# Patient Record
Sex: Male | Born: 1970 | Race: White | Hispanic: No | Marital: Single | Smoking: Never smoker
Health system: Southern US, Community
[De-identification: ages and names within clinical notes are randomized; demographics above are authoritative.]

## PROBLEM LIST (undated history)

## (undated) DIAGNOSIS — T7840XA Allergy, unspecified, initial encounter: Secondary | ICD-10-CM

## (undated) DIAGNOSIS — R12 Heartburn: Secondary | ICD-10-CM

## (undated) DIAGNOSIS — K760 Fatty (change of) liver, not elsewhere classified: Secondary | ICD-10-CM

## (undated) DIAGNOSIS — E785 Hyperlipidemia, unspecified: Secondary | ICD-10-CM

## (undated) DIAGNOSIS — D689 Coagulation defect, unspecified: Secondary | ICD-10-CM

## (undated) DIAGNOSIS — Z87442 Personal history of urinary calculi: Secondary | ICD-10-CM

## (undated) DIAGNOSIS — D649 Anemia, unspecified: Secondary | ICD-10-CM

## (undated) DIAGNOSIS — K219 Gastro-esophageal reflux disease without esophagitis: Secondary | ICD-10-CM

## (undated) DIAGNOSIS — N200 Calculus of kidney: Secondary | ICD-10-CM

## (undated) DIAGNOSIS — I81 Portal vein thrombosis: Principal | ICD-10-CM

## (undated) HISTORY — DX: Heartburn: R12

## (undated) HISTORY — DX: Coagulation defect, unspecified: D68.9

## (undated) HISTORY — DX: Allergy, unspecified, initial encounter: T78.40XA

## (undated) HISTORY — DX: Gastro-esophageal reflux disease without esophagitis: K21.9

## (undated) HISTORY — DX: Hyperlipidemia, unspecified: E78.5

## (undated) HISTORY — DX: Calculus of kidney: N20.0

## (undated) HISTORY — DX: Fatty (change of) liver, not elsewhere classified: K76.0

## (undated) HISTORY — PX: LASIK: SHX215

## (undated) HISTORY — PX: OTHER SURGICAL HISTORY: SHX169

---

## 2003-01-23 ENCOUNTER — Emergency Department (HOSPITAL_COMMUNITY): Admission: EM | Admit: 2003-01-23 | Discharge: 2003-01-24 | Payer: Self-pay | Admitting: Emergency Medicine

## 2003-01-24 ENCOUNTER — Encounter: Payer: Self-pay | Admitting: Emergency Medicine

## 2005-12-09 HISTORY — PX: ANTERIOR CRUCIATE LIGAMENT REPAIR: SHX115

## 2007-12-28 ENCOUNTER — Emergency Department (HOSPITAL_COMMUNITY): Admission: EM | Admit: 2007-12-28 | Discharge: 2007-12-28 | Payer: Self-pay | Admitting: Emergency Medicine

## 2009-03-30 ENCOUNTER — Emergency Department (HOSPITAL_BASED_OUTPATIENT_CLINIC_OR_DEPARTMENT_OTHER): Admission: EM | Admit: 2009-03-30 | Discharge: 2009-03-30 | Payer: Self-pay | Admitting: Emergency Medicine

## 2011-04-07 LAB — CBC
HCT: 47.7
Hemoglobin: 16.7
MCHC: 35
RBC: 4.92
RDW: 12.8

## 2011-04-07 LAB — URINALYSIS, ROUTINE W REFLEX MICROSCOPIC
Bilirubin Urine: NEGATIVE
Ketones, ur: 15 — AB
Leukocytes, UA: NEGATIVE
Nitrite: NEGATIVE
Urobilinogen, UA: 0.2

## 2011-04-07 LAB — COMPREHENSIVE METABOLIC PANEL
ALT: 32
AST: 24
Albumin: 3.9
Calcium: 8.9
Creatinine, Ser: 1.28
GFR calc non Af Amer: >60
Sodium: 140
Total Bilirubin: 1.1
Total Protein: 6.5

## 2011-04-07 LAB — DIFFERENTIAL: Monocytes Absolute: 0.8

## 2012-03-09 ENCOUNTER — Ambulatory Visit (INDEPENDENT_AMBULATORY_CARE_PROVIDER_SITE_OTHER): Payer: BC Managed Care – PPO | Admitting: Physician Assistant

## 2012-03-09 ENCOUNTER — Encounter: Payer: Self-pay | Admitting: Physician Assistant

## 2012-03-09 ENCOUNTER — Other Ambulatory Visit (INDEPENDENT_AMBULATORY_CARE_PROVIDER_SITE_OTHER): Payer: BC Managed Care – PPO

## 2012-03-09 VITALS — BP 124/70 | HR 70 | Ht 72.75 in | Wt 236.8 lb

## 2012-03-09 DIAGNOSIS — R109 Unspecified abdominal pain: Secondary | ICD-10-CM

## 2012-03-09 DIAGNOSIS — M549 Dorsalgia, unspecified: Secondary | ICD-10-CM

## 2012-03-09 DIAGNOSIS — R112 Nausea with vomiting, unspecified: Secondary | ICD-10-CM

## 2012-03-09 DIAGNOSIS — R52 Pain, unspecified: Secondary | ICD-10-CM

## 2012-03-09 DIAGNOSIS — N201 Calculus of ureter: Secondary | ICD-10-CM

## 2012-03-09 HISTORY — DX: Calculus of ureter: N20.1

## 2012-03-09 LAB — C-REACTIVE PROTEIN: CRP: 1.8 mg/dL (ref 0.5–20.0)

## 2012-03-09 LAB — CBC WITH DIFFERENTIAL/PLATELET
Basophils Absolute: 0.1 10*3/uL (ref 0.0–0.1)
Eosinophils Absolute: 0.1 10*3/uL (ref 0.0–0.7)
HCT: 44 % (ref 39.0–52.0)
Lymphs Abs: 2.3 10*3/uL (ref 0.7–4.0)
MCHC: 33.5 g/dL (ref 30.0–36.0)
MCV: 98.7 fl (ref 78.0–100.0)
Monocytes Absolute: 0.9 10*3/uL (ref 0.1–1.0)
Neutrophils Relative %: 66.3 % (ref 43.0–77.0)
Platelets: 333 10*3/uL (ref 150.0–400.0)
RDW: 12.1 % (ref 11.5–14.6)

## 2012-03-09 LAB — HEPATIC FUNCTION PANEL
ALT: 226 U/L — ABNORMAL HIGH (ref 0–53)
Bilirubin, Direct: 0.1 mg/dL (ref 0.0–0.3)
Total Bilirubin: 0.6 mg/dL (ref 0.3–1.2)

## 2012-03-09 MED ORDER — TRAMADOL HCL 50 MG PO TABS: ORAL_TABLET | ORAL | Status: DC

## 2012-03-09 NOTE — Progress Notes (Signed)
Subjective:    Patient ID: [name] [name], male    DOB: 13-Jul-1970, 41 y.o.   MRN: [id number]  HPI [name] [name] is a pleasant generally healthy 41 year old white male who is new to our practice today. He does have history of ureterolithiasis documented on CT scan in 2009, he does not have any other known medical problems . He has been working out of town in [place] through the summer months and is returning to [place] now. He says that he had onset of upper [place] pain about 9 days ago after he had eaten a spicy biscuit from both ankles. He says he developed pain in the epigastric and substernal area which became progressively worse had some radiation around into his back bilaterally was very uncomfortable for about 2 days. He was seen by an urgent care in [place] was given Prilosec to take once daily. He says over the next few days he did feel better was able to eat and then this past Wednesday, 2 days ago had another episode of fairly severe pain which is very similar but worse. He says this episode also lasted longer and was associated with nausea and vomiting. He was still in [place] went to the emergency room there and had labs and imaging done which he brings with him today. He describes the pain as a knot-like pressure sensation in the epigastrium. He says he really hasn't been eating very well since because he is afraid the pain will return. He has had no associated fever or chills diaphoresis no diarrhea melena or hematochezia.  He had not been taking any regular aspirin or NSAIDs. He has been drinking alcohol regularly over the past couple of months. Labs are reviewed and show an amylase of 58 lipase 231 within normal range for their labs liver tests were normal WBC 8.3 hemoglobin 14.8 hematocrit of 42.6 and UA was also normal. His BUN was 11 creatinine 1.4 with a upper limit of normal being 1.3. He had upper abdominal ultrasound done through the emergency room which the patient says was relates  him that he had sludge in his gallbladder however the final report states that there is no evidence of cholelithiasis or gallbladder wall thickening did show a dense liver consistent with extensive fatty infiltration he also had CT scan of the abdomen and pelvis done without IV or oral contrast and was again noted to have a heterogeneous fatty infiltrated liver bilateral fat containing inguinal hernias and otherwise negative study without benefit of any contrast. He has not had any further episodes over the past 48 hours.   Review of Systems  Constitutional: Positive for appetite change.  HENT: Negative.   Eyes: Negative.   Respiratory: Negative.   Cardiovascular: Negative.   Gastrointestinal: Positive for nausea and abdominal pain.  Genitourinary: Negative.   Musculoskeletal: Positive for back pain.  Neurological: Negative.   Hematological: Negative.   Psychiatric/Behavioral: Negative.    Outpatient Encounter Prescriptions as of 03/09/2012  Medication Sig Dispense Refill  . dicyclomine (BENTYL) 20 MG tablet Take 20 mg by mouth every 6 (six) hours.      Marland Kitchen omeprazole (PRILOSEC) 40 MG capsule Take 40 mg by mouth daily.      . ondansetron (ZOFRAN) 4 MG tablet Take 4 mg by mouth every 12 (twelve) hours as needed.      . traMADol (ULTRAM) 50 MG tablet Take 1 tab as needed for severe abdominal pain.  10 tablet  0    No Known Allergies  Patient Active Problem List  Diagnosis  . Ureterolithiasis   History   Social History  . Marital Status: Single    Spouse Name: N/A    Number of Children: 0  . Years of Education: N/A   Occupational History  . service tech    Social History Main Topics  . Smoking status: Never Smoker   . Smokeless tobacco: Never Used  . Alcohol Use: Yes  . Drug Use: No  . Sexually Active: Not on file   Other Topics Concern  . Not on file   Social History Narrative  . No narrative on file    Objective:   Physical Exam O. well-developed  healthy-appearing young white male in no acute distress, pleasant blood pressure 124/70 pulse 70 height 6 foot weight 236. HEENT; nontraumatic normocephalic EOMI PERRLA; sclera anicteric,neck; Supple no JVD, Cardiovascular; regular rate and rhythm with S1-S2 no murmur or gallop, Pulmonary; clear bilaterally, Abdomen; soft he does have some tenderness in the epigastrium right upper quadrant right mid quadrant and right lower quadrant no guarding no rebound no palpable mass or hepatosplenomegaly, Rectal; exam not done, Extremities; no clubbing cyanosis or edema skin warm and dry, Psych; mood and affect normal and appropriate.       Assessment & Plan:  #40 41 year old male with 2 discrete episodes of epigastric pain with radiation around into the back bilaterally occurring with within the past 10 days second episode associated with nausea and vomiting. Workup to date has been unrevealing. His pain seems to definitely be episodic though he has some persistent tenderness on exam. Will need to rule out gallbladder disease, ulcer disease, or other intra-abdominal inflammatory process. #2 hepatic steatosis, extensive fatty infiltration of the liver but normal liver function studies. #3 history of ureterolithiasis on the right  Plan;  Will repeat CBC and hepatic panel today and CRP Continue Prilosec but increase to 20 mg by mouth twice daily before breakfast and before dinner Patient advised to avoid aspirin and NSAIDs and also avoid alcohol Schedule for CT scan of the abdomen and pelvis with IV and oral contrast He has Zofran to use at home for when necessary use, and have given him a prescription for Ultram 50 mg every 6 hours as needed for another episode of severe pain. He is advised should he have a prolonged episode of that is not controlled with Ultram that he will need to seek care through the emergency room. Further plans will be based on findings of CT and today's labs. Consider CCK HIDA scan if CT is  negative.  He is given a copy of a low-fat diet today and also educational information regarding fatty liver. He will need office followup with Dr. Christella Hartigan to discuss management of the fatty liver once his acute problems are sorted out.

## 2012-03-09 NOTE — Patient Instructions (Addendum)
Please go to the basement level to have your labs drawn.  Continue the Prilosec, take twice daily, 30 min before breakfast and dinner. Samples given. Take Zofran as needed for nausea. [facility] printed a prescription for the Ultram ( Tramadol) . We have scheduled Ct scan for Tues at 1 :00 .    You have been scheduled for a CT scan of the abdomen and pelvis at [facility] ([address]---this is in the same building as Architectural technologist).   You are scheduled on 03-13-2012 Tuesday at 1:00 Pm . You should arrive at 12:45 PM  to your appointment time for registration. Please follow the written instructions below on the day of your exam:  WARNING: IF YOU ARE ALLERGIC TO IODINE/X-RAY DYE, PLEASE NOTIFY RADIOLOGY IMMEDIATELY AT [phone]! YOU WILL BE GIVEN A 13 HOUR PREMEDICATION PREP.  1) Do not eat or drink anything after 9:00 AM  (4 hours prior to your test) 2) You have been given 2 bottles of oral contrast to drink. The solution may taste better if refrigerated, but do NOT add ice or any other liquid to this solution. Shake well before drinking.    Drink 1 bottle of contrast @ 11:00 AM  (2 hours prior to your exam)  Drink 1 bottle of contrast @12 :00 Noon (1 hour prior to your exam)  You may take any medications as prescribed with a small amount of water except for the following: Metformin, Glucophage, Glucovance, Avandamet, Riomet, Fortamet, Actoplus Met, Janumet, Glumetza or Metaglip. The above medications must be held the day of the exam AND 48 hours after the exam.  The purpose of you drinking the oral contrast is to aid in the visualization of your intestinal tract. The contrast solution may cause some diarrhea. Before your exam is started, you will be given a small amount of fluid to drink. Depending on your individual set of symptoms, you may also receive an intravenous injection of x-ray contrast/dye. Plan on being at [facility] for 30 minutes or long, depending on the  type of exam you are having performed.  If you have any questions regarding your exam or if you need to reschedule, you may call the CT department at [phone] between the hours of 8:00 am and 5:00 pm, Monday-Friday.  ________________________________________________________________________

## 2012-03-09 NOTE — Progress Notes (Signed)
i agree with the plan outlined in this note 

## 2012-03-13 ENCOUNTER — Encounter (HOSPITAL_COMMUNITY): Payer: Self-pay | Admitting: Nurse Practitioner

## 2012-03-13 ENCOUNTER — Ambulatory Visit (INDEPENDENT_AMBULATORY_CARE_PROVIDER_SITE_OTHER)
Admission: RE | Admit: 2012-03-13 | Discharge: 2012-03-13 | Disposition: A | Discharge status: Home / Self Care | Payer: BC Managed Care – PPO | Source: Ambulatory Visit | Attending: Physician Assistant | Admitting: Physician Assistant

## 2012-03-13 ENCOUNTER — Inpatient Hospital Stay (HOSPITAL_COMMUNITY)
Admission: AD | Admit: 2012-03-13 | Discharge: 2012-03-14 | DRG: 206 | Disposition: A | Discharge status: Home / Self Care | Payer: BC Managed Care – PPO | Source: Ambulatory Visit | Attending: Gastroenterology | Admitting: Gastroenterology

## 2012-03-13 ENCOUNTER — Other Ambulatory Visit: Payer: Self-pay | Admitting: Oncology

## 2012-03-13 ENCOUNTER — Inpatient Hospital Stay (HOSPITAL_COMMUNITY): Payer: BC Managed Care – PPO

## 2012-03-13 DIAGNOSIS — R52 Pain, unspecified: Secondary | ICD-10-CM

## 2012-03-13 DIAGNOSIS — I81 Portal vein thrombosis: Principal | ICD-10-CM | POA: Diagnosis present

## 2012-03-13 DIAGNOSIS — R112 Nausea with vomiting, unspecified: Secondary | ICD-10-CM

## 2012-03-13 DIAGNOSIS — Z7901 Long term (current) use of anticoagulants: Secondary | ICD-10-CM

## 2012-03-13 DIAGNOSIS — E7889 Other lipoprotein metabolism disorders: Secondary | ICD-10-CM | POA: Diagnosis present

## 2012-03-13 DIAGNOSIS — R109 Unspecified abdominal pain: Secondary | ICD-10-CM

## 2012-03-13 DIAGNOSIS — N201 Calculus of ureter: Secondary | ICD-10-CM

## 2012-03-13 DIAGNOSIS — Z832 Family history of diseases of the blood and blood-forming organs and certain disorders involving the immune mechanism: Secondary | ICD-10-CM

## 2012-03-13 DIAGNOSIS — M549 Dorsalgia, unspecified: Secondary | ICD-10-CM

## 2012-03-13 HISTORY — DX: Portal vein thrombosis: I81

## 2012-03-13 LAB — COMPREHENSIVE METABOLIC PANEL
ALT: 89 U/L — ABNORMAL HIGH (ref 0–53)
AST: 33 U/L (ref 0–37)
Alkaline Phosphatase: 78 U/L (ref 39–117)
CO2: 28 mEq/L (ref 19–32)
Calcium: 9.4 mg/dL (ref 8.4–10.5)
Chloride: 102 mEq/L (ref 96–112)
GFR calc non Af Amer: 83 mL/min — ABNORMAL LOW (ref >90)
Potassium: 4.1 mEq/L (ref 3.5–5.1)
Sodium: 139 mEq/L (ref 135–145)

## 2012-03-13 LAB — CBC
MCH: 32.9 pg (ref 26.0–34.0)
MCHC: 35.1 g/dL (ref 30.0–36.0)
MCV: 93.8 fL (ref 78.0–100.0)
Platelets: 365 10*3/uL (ref 150–400)
RDW: 11.8 % (ref 11.5–15.5)

## 2012-03-13 MED ORDER — SODIUM CHLORIDE 0.9 % IV SOLN
INTRAVENOUS | Status: DC
  Administered 2012-03-13: 17:00:00 via INTRAVENOUS

## 2012-03-13 MED ORDER — ENOXAPARIN SODIUM 120 MG/0.8ML ~~LOC~~ SOLN
1.0000 mg/kg | Freq: Two times a day (BID) | SUBCUTANEOUS | Status: DC
  Administered 2012-03-14: 105 mg via SUBCUTANEOUS
  Filled 2012-03-13 (×3): qty 1.2

## 2012-03-13 MED ORDER — ONDANSETRON HCL 4 MG/2ML IJ SOLN: 4.0000 mg | Freq: Four times a day (QID) | INTRAMUSCULAR | Status: DC | PRN

## 2012-03-13 MED ORDER — DICYCLOMINE HCL 20 MG PO TABS
20.0000 mg | ORAL_TABLET | Freq: Four times a day (QID) | ORAL | Status: DC
  Filled 2012-03-13 (×3): qty 1

## 2012-03-13 MED ORDER — ENOXAPARIN SODIUM 40 MG/0.4ML ~~LOC~~ SOLN
40.0000 mg | Freq: Two times a day (BID) | SUBCUTANEOUS | Status: DC
  Administered 2012-03-13: 40 mg via SUBCUTANEOUS
  Filled 2012-03-13 (×2): qty 0.4

## 2012-03-13 MED ORDER — ACETAMINOPHEN 325 MG PO TABS: 650.0000 mg | ORAL_TABLET | Freq: Four times a day (QID) | ORAL | Status: DC | PRN

## 2012-03-13 MED ORDER — ACETAMINOPHEN 650 MG RE SUPP: 650.0000 mg | Freq: Four times a day (QID) | RECTAL | Status: DC | PRN

## 2012-03-13 MED ORDER — ENOXAPARIN SODIUM 80 MG/0.8ML ~~LOC~~ SOLN
65.0000 mg | Freq: Once | SUBCUTANEOUS | Status: AC
  Administered 2012-03-13: 65 mg via SUBCUTANEOUS
  Filled 2012-03-13: qty 0.8

## 2012-03-13 MED ORDER — TRAMADOL HCL 50 MG PO TABS: 50.0000 mg | ORAL_TABLET | Freq: Four times a day (QID) | ORAL | Status: DC | PRN

## 2012-03-13 MED ORDER — ONDANSETRON HCL 4 MG PO TABS: 4.0000 mg | ORAL_TABLET | Freq: Two times a day (BID) | ORAL | Status: DC | PRN

## 2012-03-13 MED ORDER — GADOBENATE DIMEGLUMINE 529 MG/ML IV SOLN
20.0000 mL | Freq: Once | INTRAVENOUS | Status: AC | PRN
  Administered 2012-03-13: 20 mL via INTRAVENOUS

## 2012-03-13 MED ORDER — HYDROCODONE-ACETAMINOPHEN 5-325 MG PO TABS: 1.0000 | ORAL_TABLET | ORAL | Status: DC | PRN

## 2012-03-13 MED ORDER — PANTOPRAZOLE SODIUM 40 MG PO TBEC
40.0000 mg | DELAYED_RELEASE_TABLET | Freq: Every day | ORAL | Status: DC
  Administered 2012-03-14: 40 mg via ORAL
  Filled 2012-03-13: qty 1

## 2012-03-13 MED ORDER — IOHEXOL 300 MG/ML  SOLN
100.0000 mL | Freq: Once | INTRAMUSCULAR | Status: AC | PRN
  Administered 2012-03-13: 100 mL via INTRAVENOUS

## 2012-03-13 MED ORDER — ONDANSETRON HCL 4 MG PO TABS: 4.0000 mg | ORAL_TABLET | Freq: Four times a day (QID) | ORAL | Status: DC | PRN

## 2012-03-13 NOTE — Progress Notes (Signed)
Referral MD     Reason for Referral: Portal vein thrombus  No chief complaint on file. : abdominal pain  [id number] healthy 41 yo man who presents with 2 episodes of abdominal pain , 2 weeks ago and more recently. No associated n/v or pain. Feels otherwise well, he has not been having any B symptoms. No recent weight changes; no jaundice or pruritis.   Past Medical History  Diagnosis Date  . Kidney stones   . Portal vein thrombosis   :  Past Surgical History  Procedure Date  . Lasik     left eye  . Anterior cruciate ligament repair 12/2005    left   :  Current facility-administered medications:0.9 %  sodium chloride infusion, , Intravenous, Continuous, Meredith Pel, NP, Last Rate: 75 mL/hr at 03/13/12 1707;  acetaminophen (TYLENOL) suppository 650 mg, 650 mg, Rectal, Q6H PRN, Meredith Pel, NP;  acetaminophen (TYLENOL) tablet 650 mg, 650 mg, Oral, Q6H PRN, Meredith Pel, NP;  enoxaparin (LOVENOX) injection 40 mg, 40 mg, Subcutaneous, Q12H, Meredith Pel, NP, 40 mg at 03/13/12 1855 HYDROcodone-acetaminophen (NORCO/VICODIN) 5-325 MG per tablet 1-2 tablet, 1-2 tablet, Oral, Q4H PRN, Meredith Pel, NP;  ondansetron [facility]) injection 4 mg, 4 mg, Intravenous, Q6H PRN, Meredith Pel, NP;  ondansetron [facility]) tablet 4 mg, 4 mg, Oral, Q6H PRN, Meredith Pel, NP;  pantoprazole (PROTONIX) EC tablet 40 mg, 40 mg, Oral, Q1200, Meredith Pel, NP DISCONTD: dicyclomine (BENTYL) tablet 20 mg, 20 mg, Oral, Q6H, Meredith Pel, NP;  DISCONTD: ondansetron (ZOFRAN) tablet 4 mg, 4 mg, Oral, Q12H PRN, Meredith Pel, NP;  DISCONTD: traMADol Janean Sark) tablet 50 mg, 50 mg, Oral, Q6H PRN, Meredith Pel, NP Facility-Administered Medications Ordered in Other Encounters: iohexol (OMNIPAQUE) 300 MG/ML solution 100 mL, 100 mL, Intravenous, Once PRN, Medication Radiologist, MD, 100 mL at 03/13/12 1321:     . enoxaparin (LOVENOX) injection  40 mg Subcutaneous Q12H  . pantoprazole   40 mg Oral Q1200  . DISCONTD: dicyclomine  20 mg Oral Q6H  :  No Known Allergies:  Family History  Problem Relation Age of Onset  . Diabetes Maternal Grandmother   . Colon cancer Neg Hx   . Gallbladder disease Father   :2 maternal aunts with hx of DVT. , 1 sister in good health  History   Social History  . Marital Status: Single    Spouse Name: N/A    Number of Children: 0  . Years of Education: N/A   Occupational History  . service tech    Social History Main Topics  . Smoking status: Never Smoker   . Smokeless tobacco: Never Used  . Alcohol Use: Yes     "1 to 3 per night and a couple weeks with nothing at all"  . Drug Use: No  . Sexually Active: Not on file  He works for [employer]; no hx of drug use or any HIV risk factors or Hep C. Other Topics Concern  . Not on file   Social History Narrative  .   :  A comprehensive review of systems was negative.  Exam: Patient Vitals for the past 24 hrs:  BP Temp Temp src Pulse Resp Height Weight  03/13/12 1630 131/87 mmHg 97.6 F (36.4 C) Oral 89  16  6\' 1"  (1.854 m) 236 lb (107.049 kg)   General appearance: alert, cooperative and appears stated age Eyes: conjunctivae/corneas clear. PERRL, EOM's intact. Fundi benign. Throat: lips, mucosa, and  tongue normal; teeth and gums normal Neck: no adenopathy, no carotid bruit, no JVD, supple, symmetrical, trachea midline and thyroid not enlarged, symmetric, no tenderness/mass/nodules Resp: clear to auscultation bilaterally and normal percussion bilaterally Cardio: regular rate and rhythm, S1, S2 normal, no murmur, click, rub or gallop and normal apical impulse GI: soft, non-tender; bowel sounds normal; no masses,  no organomegaly Extremities: extremities normal, atraumatic, no cyanosis or edema Pulses: 2+ and symmetric Skin: Skin color, texture, turgor normal. No rashes or lesions Lymph nodes: Cervical, supraclavicular, and axillary nodes normal.   Basename 03/13/12 1825  WBC 7.9    HGB 14.3  HCT 40.7  PLT 365    Basename 03/13/12 1825  NA 139  K 4.1  CL 102  CO2 28  GLUCOSE 92  BUN 10  CREATININE 1.10  CALCIUM 9.4    Blood smear review: n/a  Pathology:pending  Ct Abdomen Pelvis W Contrast  03/13/2012  *RADIOLOGY REPORT*  Clinical Data: Abdominal pain.  Back pain.  Nausea and vomiting. Diffuse abdominal tenderness.  The  CT ABDOMEN AND PELVIS WITH CONTRAST  Technique:  Multidetector CT imaging of the abdomen and pelvis was performed following the standard protocol during bolus administration of intravenous contrast.  Contrast: OMNIPAQUE IOHEXOL 300 MG/ML  SOLN  Comparison: 12/28/2007  Findings: Thrombosis of the left portal vein is seen as well as nonocclusive thrombus within the right portal vein.  The main portal vein and superior mesenteric vein remains patent.  Geographic pattern of hepatic steatosis is demonstrated involving the right hepatic lobe.  There is also an ill defined rounded hypovascular area in the superior left hepatic lobe (segment 2) which measures approximately 3 cm.  Hepatic neoplasm cannot be excluded.  No other definite liver masses are identified.  The gallbladder, pancreas, spleen, adrenal glands, and kidneys are normal appearance.  No other soft tissue masses or lymphadenopathy identified within the abdomen or pelvis.  No evidence of bowel wall thickening or dilatation.  Normal appendix is visualized.  No evidence of inflammatory process or abnormal fluid collections. Small bilateral inguinal hernias are again seen only containing fat.  No evidence of herniated bowel. No suspicious bone lesions identified.  IMPRESSION:  1.  Left portal vein thrombosis, and nonocclusive thrombus within the right portal vein. 2.  3 cm ill-defined hypovascular lesion in the lateral segment of the left hepatic lobe superiorly (segment 2);  hepatic neoplasm cannot be excluded.  Recommend abdomen MRI without and with contrast for further evaluation. 3.  Stable  small bilateral fat containing inguinal hernias.  These results were called by telephone on 03/13/2012 at 1405 hours to [facility], who verbally acknowledged these results.   Original Report Authenticated By: Danae Orleans, M.D.     Assessment and Plan: 41 yo with hx of portal vein thrombus. MRI/MRA pending to r/o underlying small HCC. No other predisposing factors. Hypercoaguable w/u in progress. He is currently on lovenox which is adequate. I would check Xa levels  As well.  He will likely require indefinite anticoagulation. I will follow with you.   Pierce Crane MD [facility] [phone]

## 2012-03-13 NOTE — H&P (Signed)
Primary Care Physician:  No primary provider on file. Primary Gastroenterologist:  Rob Bunting, MD  CHIEF COMPLAINT:  portal vein thrombosis on CTscan  HPI: [name] [name] is a 41 y.o. male who was seen in our office as a new patient 03/09/12 for evaluation of abdominal pain. The patient described to severe episodes of epigastric pain radiating around into his back and associated with nausea and vomiting. Please refer to office note by Mike Gip, P.A.-C. subsequent to that appointment patient was sent for labs as well as a.CTscan of the abdomen and pelvis with contrast. The radiologist called report today. Findings included thrombosis of left portal vein as well as nonocclusive thrombus within the right portal vein. There was also fatty liver disease and an ill defined hypovascular area in the left liver lobe. His AST was elevated at 141, ALT 226. Patient was called by our office and advised to come to the hospital for admission.  Patient has not had any further abdominal pain since his last episode several days ago. No fever or chills. No nausea or vomiting. Patient denies personal history or family history of liver disease. He has no known history of hypercoagulable state. A month ago patient was bitten by a bee which he is allergic to. He went to the emergency department in [place] where he was working, was given steroids and possibly antihistamines. Then, a couple weeks ago, he developed right upper extremity itching and swelling, presumably from something he came in contact with. Patient was treated with antibiotics , swelling resolved. No other recent illnesses. No recent weight loss, patient has gained weight. He does consume alcohol but denies excessive use. He's not had any alcohol in over a week.  Past Medical History  Diagnosis Date  . Kidney stones     Past Surgical History  Procedure Date  . Lasik     left eye  . Anterior cruciate ligament repair 12/2005    left     Prior to  Admission medications   Medication Sig Start Date End Date Taking? Authorizing Provider  omeprazole (PRILOSEC) 40 MG capsule Take 40 mg by mouth 2 (two) times daily.    Yes Historical Provider, MD  ondansetron (ZOFRAN) 4 MG tablet Take 4 mg by mouth every 12 (twelve) hours as needed. Nausea.   Yes Historical Provider, MD  traMADol (ULTRAM) 50 MG tablet Take 1 tab as needed for severe abdominal pain. 03/09/12   Amy Oswald Hillock, PA    Current Facility-Administered Medications  Medication Dose Route Frequency Provider Last Rate Last Dose  . 0.9 %  sodium chloride infusion   Intravenous Continuous Meredith Pel, NP      . acetaminophen (TYLENOL) tablet 650 mg  650 mg Oral Q6H PRN Meredith Pel, NP       Or  . acetaminophen (TYLENOL) suppository 650 mg  650 mg Rectal Q6H PRN Meredith Pel, NP      . HYDROcodone-acetaminophen (NORCO/VICODIN) 5-325 MG per tablet 1-2 tablet  1-2 tablet Oral Q4H PRN Meredith Pel, NP      . ondansetron [facility]) tablet 4 mg  4 mg Oral Q6H PRN Meredith Pel, NP       Or  . ondansetron [facility]) injection 4 mg  4 mg Intravenous Q6H PRN Meredith Pel, NP      . pantoprazole (PROTONIX) EC tablet 40 mg  40 mg Oral Q1200 Meredith Pel, NP      . DISCONTD: dicyclomine (BENTYL) tablet 20  mg  20 mg Oral Q6H Meredith Pel, NP      . DISCONTD: ondansetron [facility]) tablet 4 mg  4 mg Oral Q12H PRN Meredith Pel, NP      . DISCONTD: traMADol Janean Sark) tablet 50 mg  50 mg Oral Q6H PRN Meredith Pel, NP       Facility-Administered Medications Ordered in Other Encounters  Medication Dose Route Frequency Provider Last Rate Last Dose  . iohexol (OMNIPAQUE) 300 MG/ML solution 100 mL  100 mL Intravenous Once PRN Medication Radiologist, MD   100 mL at 03/13/12 1321    Allergies as of 03/13/2012  . (No Known Allergies)    Family History  Problem Relation Age of Onset  . Diabetes Maternal Grandmother   . Colon cancer Neg Hx   . Gallbladder disease Father      History   Social History  . Marital Status: Single    Spouse Name: N/A    Number of Children: 0  . Years of Education: N/A   Occupational History  . service tech    Social History Main Topics  . Smoking status: Never Smoker   . Smokeless tobacco: Never Used  . Alcohol Use: Yes     "1 to 3 per night and a couple weeks with nothing at all"  . Drug Use: No  . Sexually Active: Not on file     Review of Systems: All systems reviewed and negative except where noted in the history of present illness  Physical Exam: Vital signs in last 24 hours: Temp:  [97.6 F (36.4 C)] 97.6 F (36.4 C) (09/03 1630) Pulse Rate:  [89] 89  (09/03 1630) Resp:  [16] 16  (09/03 1630) BP: (131)/(87) 131/87 mmHg (09/03 1630)   General:   Well-developed, pleasant white male in NAD Head:  Normocephalic and atraumatic. Eyes:  Sclera clear, no icterus.   Conjunctiva pink. Ears:  Normal auditory acuity. Neck:  Supple; no masses Lungs:  Clear throughout to auscultation.   No wheezes, crackles, or rhonchi. No acute distress. Heart:  Regular rate and rhythm Abdomen:  Soft, nontender and nondistended. No masses, no hepatomegaly. Normal bowel sounds, without guarding, and without rebound.   Msk:  Symmetrical without gross deformities. Normal posture. Pulses:  Normal pulses noted. Extremities:  Without edema. Neurologic:  Alert and  oriented x4;  grossly normal neurologically. Skin:  Intact without significant lesions or rashes. Cervical Nodes:  No significant cervical adenopathy. Psych:  Alert and cooperative. Normal mood and affect.  Studies/Results: Ct Abdomen Pelvis W Contrast  03/13/2012  *RADIOLOGY REPORT*  Clinical Data: Abdominal pain.  Back pain.  Nausea and vomiting. Diffuse abdominal tenderness.  The  CT ABDOMEN AND PELVIS WITH CONTRAST  Technique:  Multidetector CT imaging of the abdomen and pelvis was performed following the standard protocol during bolus administration of intravenous  contrast.  Contrast: OMNIPAQUE IOHEXOL 300 MG/ML  SOLN  Comparison: 12/28/2007  Findings: Thrombosis of the left portal vein is seen as well as nonocclusive thrombus within the right portal vein.  The main portal vein and superior mesenteric vein remains patent.  Geographic pattern of hepatic steatosis is demonstrated involving the right hepatic lobe.  There is also an ill defined rounded hypovascular area in the superior left hepatic lobe (segment 2) which measures approximately 3 cm.  Hepatic neoplasm cannot be excluded.  No other definite liver masses are identified.  The gallbladder, pancreas, spleen, adrenal glands, and kidneys are normal appearance.  No other soft  tissue masses or lymphadenopathy identified within the abdomen or pelvis.  No evidence of bowel wall thickening or dilatation.  Normal appendix is visualized.  No evidence of inflammatory process or abnormal fluid collections. Small bilateral inguinal hernias are again seen only containing fat.  No evidence of herniated bowel. No suspicious bone lesions identified.  IMPRESSION:  1.  Left portal vein thrombosis, and nonocclusive thrombus within the right portal vein. 2.  3 cm ill-defined hypovascular lesion in the lateral segment of the left hepatic lobe superiorly (segment 2);  hepatic neoplasm cannot be excluded.  Recommend abdomen MRI without and with contrast for further evaluation. 3.  Stable small bilateral fat containing inguinal hernias.  These results were called by telephone on 03/13/2012 at 1405 hours to [facility], who verbally acknowledged these results.   Original Report Authenticated By: Danae Orleans, M.D.     Impression / Plan:   Upper abdominal pain, nausea and vomiting in setting of CTscan revealing left portal vein thrombosis and nonocclusive thrombus within right portal vein as well as a 3cm ill-defined hypovascular lesion in lateral segment of left hepatic lobe.  Etiology of thrombosis unclear in this previously  healthy 41 year old male. Rule out malignancy (?hepatic). Rule out infectious. Rule out hypercoagulable state. Will obtain MRI abdomen with liver protocol today to better characterize liver lesion. Hematology has been consulted. Will begin Lovenox 40mg  SQ BID until hematology sees and makes recommendations.      LOS: 0 days   Willette Cluster  03/13/2012, 4:55 PM    ________________________________________________________________________  [facility] MD note:  I personally examined the patient, reviewed the data and agree with the assessment and plan described above.  Underlying clotting disorder vs tumor?  MRI and hematology input.  For now lovenox bid.   Rob Bunting, MD [facility] Pager [phone]

## 2012-03-13 NOTE — Progress Notes (Addendum)
ANTICOAGULATION CONSULT NOTE - Initial Consult  Pharmacy Consult for lovenox Indication: Portal vein thrombus  No Known Allergies  Patient Measurements: Height: 6\' 1"  (185.4 cm) Weight: 236 lb (107.049 kg) IBW/kg (Calculated) : 79.9  Heparin Dosing Weight:   Vital Signs: Temp: 97.6 F (36.4 C) (09/03 1630) Temp src: Oral (09/03 1630) BP: 131/87 mmHg (09/03 1630) Pulse Rate: 89  (09/03 1630)  Labs:  Basename 03/13/12 1825  HGB 14.3  HCT 40.7  PLT 365  APTT 31  LABPROT 13.1  INR 0.97  HEPARINUNFRC --  CREATININE 1.10  CKTOTAL --  CKMB --  TROPONINI --    Estimated Creatinine Clearance: 115.7 ml/min (by C-G formula based on Cr of 1.1).   Medical History: Past Medical History  Diagnosis Date  . Kidney stones   . Portal vein thrombosis     Medications:  Scheduled:    . enoxaparin  1 mg/kg Subcutaneous Q12H  . pantoprazole  40 mg Oral Q1200  . DISCONTD: dicyclomine  20 mg Oral Q6H  . DISCONTD: enoxaparin (LOVENOX) injection  40 mg Subcutaneous Q12H    Assessment: 41 YOM admitted with abd pain, CT reveals L portal vein thrombosis and nonocclusive thrombus of R portal vein.  Concern is for possible malignancy.  Baseline CBC is OK. Hypercoag panel pending   Goal of Therapy:  Anti-Xa level 0.6-1.2 units/ml 4hrs after LMWH dose given Monitor platelets by anticoagulation protocol: Yes   Plan:  -oncology has appropriately ordered enoxaparin 1mg /kg q12h. He we given 40mg  of enox earlier this evening, thus will give a one-time make-up dose of 65mg  then start 105mg  SQ q12h. -CBC q72h   Dannielle Huh 03/13/2012,8:13 PM

## 2012-03-14 ENCOUNTER — Telehealth: Payer: Self-pay | Admitting: *Deleted

## 2012-03-14 ENCOUNTER — Encounter: Payer: Self-pay | Admitting: Nurse Practitioner

## 2012-03-14 LAB — CBC
Hemoglobin: 14.5 g/dL (ref 13.0–17.0)
MCH: 33.1 pg (ref 26.0–34.0)
MCV: 93.8 fL (ref 78.0–100.0)
RBC: 4.38 MIL/uL (ref 4.22–5.81)

## 2012-03-14 LAB — AFP TUMOR MARKER: AFP-Tumor Marker: 2.2 ng/mL (ref 0.0–8.0)

## 2012-03-14 MED ORDER — WARFARIN SODIUM 5 MG PO TABS: 5.0000 mg | ORAL_TABLET | Freq: Every day | ORAL | Status: DC

## 2012-03-14 MED ORDER — FONDAPARINUX SODIUM 10 MG/0.8ML ~~LOC~~ SOLN: 10.0000 mg | SUBCUTANEOUS | Status: DC

## 2012-03-14 NOTE — Progress Notes (Signed)
Pt D/C home with Arixtra. Pt teaching done on medication administration. D/C instructions done. Pt  Denies pain. Vitals within normal range for patient.

## 2012-03-14 NOTE — Progress Notes (Signed)
[facility]  I spoke with Dr. Donnie Coffin regarding a follow up appointment with him. His office will contact patient for appointment. Dr. Donnie Coffin would like Coumadin 5mg  daily started at time of discharge ( in addition to Arixtra). I spoke with inpatient pharmacist, patient will need 10mg   SQ Arixtra daily based on his weight. Spoke with patient's nurse and patient will need coumadin teaching as well as instruction on SQ injection. Patient will call pharmacy prior to leaving hospital to make sure there are no problems with getting Arixtra (in stock and insurance will pay) as he cannot miss any doses.   Willette Cluster, NP-C

## 2012-03-14 NOTE — Progress Notes (Signed)
[facility] Progress Note  SUBJECTIVE: uneventful night. No abdominal pain. Father and girlfriend in the room  OBJECTIVE:  Vital signs in last 24 hours: Temp:  [97.6 F (36.4 C)-98.1 F (36.7 C)] 97.7 F (36.5 C) (09/04 0626) Pulse Rate:  [87-91] 87  (09/04 0626) Resp:  [16] 16  (09/04 0626) BP: (129-134)/(73-87) 134/73 mmHg (09/04 0626) SpO2:  [97 %-98 %] 97 % (09/04 0626) Weight:  [236 lb (107.049 kg)] 236 lb (107.049 kg) (09/03 1630) Last BM Date: 03/14/12 General:    Pleasant white male in NAD Heart:  Regular rate and rhythm Lungs: Respirations even and unlabored, lungs CTA bilaterally Abdomen:  Soft, nontender and nondistended. Normal bowel sounds. Extremities:  Without edema. Neurologic:  Alert and oriented,  grossly normal neurologically. Psych:  Cooperative. Normal mood and affect.   Lab Results:  Basename 03/14/12 0502 03/13/12 1825  WBC 7.8 7.9  HGB 14.5 14.3  HCT 41.1 40.7  PLT 333 365   BMET  Basename 03/13/12 1825  NA 139  K 4.1  CL 102  CO2 28  GLUCOSE 92  BUN 10  CREATININE 1.10  CALCIUM 9.4   LFT  Basename 03/13/12 1825  PROT 7.1  ALBUMIN 3.9  AST 33  ALT 89*  ALKPHOS 78  BILITOT 0.3  BILIDIR --  IBILI --   PT/INR  Basename 03/14/12 0502 03/13/12 1825  LABPROT 13.7 13.1  INR 1.03 0.97    Studies/Results: Mr Abdomen W Wo Contrast  03/14/2012  *RADIOLOGY REPORT*  Clinical Data: Abdominal pain, nausea and vomiting.  Portal vein thrombosis identified on recent CT scan.  Ill-defined liver lesion also noted.  MRI ABDOMEN WITH AND WITHOUT CONTRAST  Technique:  Multiplanar multisequence MR imaging of the abdomen was performed both before and after administration of intravenous contrast.  Contrast: 20mL MULTIHANCE GADOBENATE DIMEGLUMINE 529 MG/ML IV SOLN  Comparison: CT of the abdomen and pelvis 03/13/2012.  Findings:  There is a filling defect within the distal portal vein immediately before the bifurcation, with some extension of  nonocclusive clot into the right portal vein proximally, but complete thrombosis of the left portal vein branch.  The superior mesenteric vein and splenic vein both appear to be patent.  On post-gadolinium images, there is relative hypoperfusion of the right lobe of the liver as compared to the left lobe of the liver on early arterial phase images, presumably secondary to augmented arterial flow to the left lobe of the liver (related to the left portal vein thrombosis). Additionally, there is marked loss of signal intensity throughout the right lobe of the liver on out of phase dual echo images, compatible with hepatic steatosis.  In segment 2 of the liver in the area of suspected mass on recent CT examination, there is no well-defined solid lesion identified. There is a subtle area of serpiginous T2 signal abnormality on image 15 of series 3 which corresponds to a very subtle area of early arterial phase hypoperfusion (image 30 of series 1101) which subsequently becomes nondescript on portal venous phase and more delayed phase post gadolinium images.  Overall, this area is nonspecific, but is strongly favored to represent a perfusion anomaly.  The appearance of the pancreas, spleen, bilateral adrenal glands and bilateral kidneys is unremarkable.  IMPRESSION: 1.  Portal vein thrombosis.  Specifically, there is nonocclusive clot in the distal main portal vein extending into the proximal right portal vein branch, but complete thrombosis of the left portal vein branch.  Associated with this is altered perfusion in  the liver, as detailed above, including a perfusion anomaly in segment 2 of the liver (which accounts for the perceived lesion on recent CT scan in this region).  Additionally, there is marked hepatic steatosis of the right lobe of the liver. 2.  No definite focal hepatic mass identified.  These results were called by telephone on 03/13/2012 at 09:38 a.m. to Dr. Christella Hartigan, who verbally acknowledged these  results.   Original Report Authenticated By: Florencia Reasons, M.D.      ASSESSMENT / PLAN:   1. Portal vein thrombosis. MRI reveals filling defect within the distal portal vein immediately before the bifurcation, with some extension of nonocclusive clot into the right portal vein proximally, but complete thrombosis of the left portal vein Etiology of thrombosis unclear in this previously healthy 41 year old male. The MRI does not correlate with CTscan findings of liver mass. The liver mass on CTscan is felt on MRI to represent a profusion anomaly rather than a mass.  Appreciate Dr. Renelda Loma assistance with case, labs in progress. Patient on therapeutic dose of Lovenox.  2. [facility] of clotting disorder.. Patient's father is present today. He gives a family history of Factor V deficiency in patient's aunt. There is apparently another aunt with a clotting disorder as well.   3. Severe steatosis. He will need weight loss and decreased ETOH intake.   LOS: 1 day   Willette Cluster  03/14/2012, 10:04 AM   ________________________________________________________________________  [facility] MD note:  I personally examined the patient, reviewed the data.  MRI showed no liver masses.  He has 1-2 aunts with blood clotting problems and so I expect he does as well.  I spoke with Dr. Donnie Coffin, we will be starting arixtra, will get pharmacy advice on dose and how long he needs lovenox bridge (if at all). D/c home after that is all set up.  Dr. Donnie Coffin will be following him as an outptatient and is likely to send him to [facility] for advise from regional/national expert on duration of therapy.   Rob Bunting, MD [facility] Pager [phone]

## 2012-03-14 NOTE — Telephone Encounter (Signed)
Per staff  Message from the md patient's mother confirmed over the phone the new date and time of the 03-21-2012 appointment  Staff message did state 03-20-2012 md had no open slots md had an open slot on 03-21-2012 at 3:00pm

## 2012-03-14 NOTE — Discharge Summary (Signed)
[facility] Discharge Summary  Name: [name] [name] MRN: [id number] DOB: 03-Jun-1971 41 y.o. PCP:  No primary provider on file.  Date of Admission: 03/13/2012  4:21 PM Date of Discharge: 03/14/2012 Attending Physician: Rachael Fee, MD Primary Gastroenterologist: Rob Bunting, MD Discharging Physician:Daniel Christella Hartigan, MD  Discharge Diagnosis: Portal vein thrombosis  Consultations: Hematology - Dr. Donnie Coffin  Procedures Performed:  Mr Abdomen W Wo Contrast  03/14/2012  *RADIOLOGY REPORT*  Clinical Data: Abdominal pain, nausea and vomiting.  Portal vein thrombosis identified on recent CT scan.  Ill-defined liver lesion also noted.  MRI ABDOMEN WITH AND WITHOUT CONTRAST  Technique:  Multiplanar multisequence MR imaging of the abdomen was performed both before and after administration of intravenous contrast.  Contrast: 20mL MULTIHANCE GADOBENATE DIMEGLUMINE 529 MG/ML IV SOLN  Comparison: CT of the abdomen and pelvis 03/13/2012.  Findings:  There is a filling defect within the distal portal vein immediately before the bifurcation, with some extension of nonocclusive clot into the right portal vein proximally, but complete thrombosis of the left portal vein branch.  The superior mesenteric vein and splenic vein both appear to be patent.  On post-gadolinium images, there is relative hypoperfusion of the right lobe of the liver as compared to the left lobe of the liver on early arterial phase images, presumably secondary to augmented arterial flow to the left lobe of the liver (related to the left portal vein thrombosis). Additionally, there is marked loss of signal intensity throughout the right lobe of the liver on out of phase dual echo images, compatible with hepatic steatosis.  In segment 2 of the liver in the area of suspected mass on recent CT examination, there is no well-defined solid lesion identified. There is a subtle area of serpiginous T2 signal abnormality on image 15 of series 3 which  corresponds to a very subtle area of early arterial phase hypoperfusion (image 30 of series 1101) which subsequently becomes nondescript on portal venous phase and more delayed phase post gadolinium images.  Overall, this area is nonspecific, but is strongly favored to represent a perfusion anomaly.  The appearance of the pancreas, spleen, bilateral adrenal glands and bilateral kidneys is unremarkable.  IMPRESSION: 1.  Portal vein thrombosis.  Specifically, there is nonocclusive clot in the distal main portal vein extending into the proximal right portal vein branch, but complete thrombosis of the left portal vein branch.  Associated with this is altered perfusion in the liver, as detailed above, including a perfusion anomaly in segment 2 of the liver (which accounts for the perceived lesion on recent CT scan in this region).  Additionally, there is marked hepatic steatosis of the right lobe of the liver. 2.  No definite focal hepatic mass identified.  These results were called by telephone on 03/13/2012 at 09:38 a.m. to Dr. Christella Hartigan, who verbally acknowledged these results.   Original Report Authenticated By: Florencia Reasons, M.D.    Ct Abdomen Pelvis W Contrast  03/13/2012  *RADIOLOGY REPORT*  Clinical Data: Abdominal pain.  Back pain.  Nausea and vomiting. Diffuse abdominal tenderness.  The  CT ABDOMEN AND PELVIS WITH CONTRAST  Technique:  Multidetector CT imaging of the abdomen and pelvis was performed following the standard protocol during bolus administration of intravenous contrast.  Contrast: OMNIPAQUE IOHEXOL 300 MG/ML  SOLN  Comparison: 12/28/2007  Findings: Thrombosis of the left portal vein is seen as well as nonocclusive thrombus within the right portal vein.  The main portal vein and superior mesenteric vein remains patent.  Geographic pattern of hepatic steatosis is demonstrated involving the right hepatic lobe.  There is also an ill defined rounded hypovascular area in the superior left  hepatic lobe (segment 2) which measures approximately 3 cm.  Hepatic neoplasm cannot be excluded.  No other definite liver masses are identified.  The gallbladder, pancreas, spleen, adrenal glands, and kidneys are normal appearance.  No other soft tissue masses or lymphadenopathy identified within the abdomen or pelvis.  No evidence of bowel wall thickening or dilatation.  Normal appendix is visualized.  No evidence of inflammatory process or abnormal fluid collections. Small bilateral inguinal hernias are again seen only containing fat.  No evidence of herniated bowel. No suspicious bone lesions identified.  IMPRESSION:  1.  Left portal vein thrombosis, and nonocclusive thrombus within the right portal vein. 2.  3 cm ill-defined hypovascular lesion in the lateral segment of the left hepatic lobe superiorly (segment 2);  hepatic neoplasm cannot be excluded.  Recommend abdomen MRI without and with contrast for further evaluation. 3.  Stable small bilateral fat containing inguinal hernias.  These results were called by telephone on 03/13/2012 at 1405 hours to [facility], who verbally acknowledged these results.   Original Report Authenticated By: Danae Orleans, M.D.     GI Procedures: none  History/Physical Exam:  See Admission H&P  Admission HPI: Done by Mike Gip, P.A.-C [name] [name] is a pleasant generally healthy 40 year old white male who is new to our practice today. He does have history of ureterolithiasis documented on CT scan in 2009, he does not have any other known medical problems . He has been working out of town in [place] through the summer months and is returning to [place] now. He says that he had onset of upper [place] pain about 9 days ago after he had eaten a spicy biscuit from both ankles. He says he developed pain in the epigastric and substernal area which became progressively worse had some radiation around into his back bilaterally was very uncomfortable for about 2 days. He was  seen by an urgent care in [place] was given Prilosec to take once daily. He says over the next few days he did feel better was able to eat and then this past Wednesday, 2 days ago had another episode of fairly severe pain which is very similar but worse. He says this episode also lasted longer and was associated with nausea and vomiting. He was still in [place] went to the emergency room there and had labs and imaging done which he brings with him today. He describes the pain as a knot-like pressure sensation in the epigastrium. He says he really hasn't been eating very well since because he is afraid the pain will return. He has had no associated fever or chills diaphoresis no diarrhea melena or hematochezia. He had not been taking any regular aspirin or NSAIDs. He has been drinking alcohol regularly over the past couple of months. Labs are reviewed and show an amylase of 58 lipase 231 within normal range for their labs liver tests were normal WBC 8.3 hemoglobin 14.8 hematocrit of 42.6 and UA was also normal. His BUN was 11 creatinine 1.4 with a upper limit of normal being 1.3. He had upper abdominal ultrasound done through the emergency room which the patient says was relates him that he had sludge in his gallbladder however the final report states that there is no evidence of cholelithiasis or gallbladder wall thickening did show a dense liver consistent with extensive fatty infiltration he  also had CT scan of the abdomen and pelvis done without IV or oral contrast and was again noted to have a heterogeneous fatty infiltrated liver bilateral fat containing inguinal hernias and otherwise negative study without benefit of any contrast.  He has not had any further episodes over the past 48 hours.   Hospital Course by problem list:  Portal vein thrombosis of unknown etiology. CTscan of abdomen and pelvis was ordered at time of patient's outpatient visit. Findings were called to the office and included left  portal vein thrombosis, nonocclusive thrombus within the right portal vein and a  2. 3 cm ill-defined hypovascular lesion in the lateral segment of the left hepatic lobe superiorly.  We immediately contacted patient and made arrangements for hospital admission for workup and treatment of portal vein thrombosis. Patient was admitted to a medical bed at [facility]. He had an MRI on the evening of admission. Findings confirmed portal vein thrombosis but no "mass" was seen in liver. CT scan finding was felt to represent area of hypoperfusion in the liver rather than a mass. Patient had no further episodes of abdominal pain or nausea while hospitalized. Hematology was consulted and patient was seen by Dr. Donnie Coffin. Workup for hypercoagulable disorder was begun, patient was started on Lovenox. On the second day of admission patient wanted to be discharged. Hematology recommended changing patient from Lovenox to Arixtra. In addition, coumadin 5mg  daily was initiated at time of discharge. I spoke with Dr. Donnie Coffin regarding discharge. His office would contact patient with follow up appointment.     Discharge Vitals:  BP 118/92  Pulse 83  Temp 97.8 F (36.6 C) (Oral)  Resp 16  Ht 6\' 1"  (1.854 m)  Wt 236 lb (107.049 kg)  BMI 31.14 kg/m2  SpO2 99%  Discharge Labs:  Results for orders placed during the hospital encounter of 03/13/12 (from the past 24 hour(s))  COMPREHENSIVE METABOLIC PANEL     Status: Abnormal   Collection Time   03/13/12  6:25 PM      Component Value Range   Sodium 139  135 - 145 mEq/L   Potassium 4.1  3.5 - 5.1 mEq/L   Chloride 102  96 - 112 mEq/L   CO2 28  19 - 32 mEq/L   Glucose, Bld 92  70 - 99 mg/dL   BUN 10  6 - 23 mg/dL   Creatinine, Ser 7.82  0.50 - 1.35 mg/dL   Calcium 9.4  8.4 - 95.6 mg/dL   Total Protein 7.1  6.0 - 8.3 g/dL   Albumin 3.9  3.5 - 5.2 g/dL   AST 33  0 - 37 U/L   ALT 89 (*) 0 - 53 U/L   Alkaline Phosphatase 78  39 - 117 U/L   Total Bilirubin 0.3  0.3 -  1.2 mg/dL   GFR calc non Af Amer 83 (*) >90 mL/min   GFR calc Af Amer >90  >90 mL/min  CBC     Status: Normal   Collection Time   03/13/12  6:25 PM      Component Value Range   WBC 7.9  4.0 - 10.5 K/uL   RBC 4.34  4.22 - 5.81 MIL/uL   Hemoglobin 14.3  13.0 - 17.0 g/dL   HCT 21.3  08.6 - 57.8 %   MCV 93.8  78.0 - 100.0 fL   MCH 32.9  26.0 - 34.0 pg   MCHC 35.1  30.0 - 36.0 g/dL   RDW 11.8  11.5 - 15.5 %   Platelets 365  150 - 400 K/uL  PROTIME-INR     Status: Normal   Collection Time   03/13/12  6:25 PM      Component Value Range   Prothrombin Time 13.1  11.6 - 15.2 seconds   INR 0.97  0.00 - 1.49  APTT     Status: Normal   Collection Time   03/13/12  6:25 PM      Component Value Range   aPTT 31  24 - 37 seconds  PROTIME-INR     Status: Normal   Collection Time   03/14/12  5:02 AM      Component Value Range   Prothrombin Time 13.7  11.6 - 15.2 seconds   INR 1.03  0.00 - 1.49  CBC     Status: Normal   Collection Time   03/14/12  5:02 AM      Component Value Range   WBC 7.8  4.0 - 10.5 K/uL   RBC 4.38  4.22 - 5.81 MIL/uL   Hemoglobin 14.5  13.0 - 17.0 g/dL   HCT 08.6  57.8 - 46.9 %   MCV 93.8  78.0 - 100.0 fL   MCH 33.1  26.0 - 34.0 pg   MCHC 35.3  30.0 - 36.0 g/dL   RDW 62.9  52.8 - 41.3 %   Platelets 333  150 - 400 K/uL  ANTITHROMBIN III     Status: Normal   Collection Time   03/14/12  5:02 AM      Component Value Range   AntiThromb III Func 80  75 - 120 %  HOMOCYSTEINE, SERUM     Status: Normal   Collection Time   03/14/12  5:02 AM      Component Value Range   Homocysteine-Norm 7.5  4.0 - 15.4 umol/L  AFP TUMOR MARKER     Status: Normal   Collection Time   03/14/12  5:02 AM      Component Value Range   AFP-Tumor Marker 2.2  0.0 - 8.0 ng/mL    Disposition and follow-up:   Mr.Kaspar J Gren was discharged from [facility] in stable condition.    Follow-up Appointments: Discharge Orders    Future Orders Please Complete By Expires   Increase activity slowly       Discharge instructions      Comments:   You will need to follow up with Dr. Donnie Coffin (hematology) in his office   Discontinue IV         Discharge Medications: Medication List  As of 03/14/2012  4:21 PM   STOP taking these medications         omeprazole 40 MG capsule      ondansetron 4 MG tablet      traMADol 50 MG tablet         TAKE these medications         fondaparinux 10 MG/0.8ML Soln   Commonly known as: ARIXTRA   Inject 0.8 mLs (10 mg total) into the skin daily.      warfarin 5 MG tablet   Commonly known as: COUMADIN   Take 1 tablet (5 mg total) by mouth daily.            Signed: Willette Cluster 03/14/2012, 4:21 PM

## 2012-03-15 ENCOUNTER — Other Ambulatory Visit: Payer: Self-pay | Admitting: Oncology

## 2012-03-15 DIAGNOSIS — I81 Portal vein thrombosis: Secondary | ICD-10-CM

## 2012-03-16 ENCOUNTER — Ambulatory Visit (INDEPENDENT_AMBULATORY_CARE_PROVIDER_SITE_OTHER): Payer: BC Managed Care – PPO | Admitting: Family Medicine

## 2012-03-16 ENCOUNTER — Encounter: Payer: Self-pay | Admitting: Nurse Practitioner

## 2012-03-16 ENCOUNTER — Encounter: Payer: Self-pay | Admitting: Family Medicine

## 2012-03-16 VITALS — BP 133/80 | HR 86 | Temp 97.5°F | Ht 73.0 in | Wt 233.8 lb

## 2012-03-16 DIAGNOSIS — I81 Portal vein thrombosis: Secondary | ICD-10-CM

## 2012-03-16 DIAGNOSIS — K760 Fatty (change of) liver, not elsewhere classified: Secondary | ICD-10-CM

## 2012-03-16 DIAGNOSIS — K7689 Other specified diseases of liver: Secondary | ICD-10-CM

## 2012-03-16 DIAGNOSIS — Z5181 Encounter for therapeutic drug level monitoring: Secondary | ICD-10-CM

## 2012-03-16 DIAGNOSIS — Z7901 Long term (current) use of anticoagulants: Secondary | ICD-10-CM

## 2012-03-16 LAB — APC RESISTANCE: Activated Protein C Resistance: 4.6 ratio (ref >=2.1)

## 2012-03-16 LAB — POCT INR: INR: 1.1

## 2012-03-16 LAB — PROTHROMBIN GENE MUTATION

## 2012-03-16 NOTE — Progress Notes (Signed)
Subjective:    Patient ID: [name] [name], male    DOB: [birth year], 41 y.o.   MRN: [id number]  HPI Here to est -- needs a primary care doctor   Has hepatic portal vein clots -- was in [place] and developed abdominal pain  Started with urgent care - thought poss PUD or gallbladder  Got better and then worse - vomited , went to the hospital   Came back here- had CT scan and found the clots   Has seen GI-- Amy Easterwood F/u Ct scan   Heme-- has appt with Dr Donnie Coffin next week to start hypercoag work up -- on wednesday Is on arixtra shot Also on coumadin    Has factor 5 disorder in family -- in an aunt   Has had some heartburn in the past --- is intermittent  occ food triggers/ peanut butter/oatmeal  Perhaps spicy foods  Has some samples of pirlosec at home   Diet and exercise not optimal  Is in need of health mt visit and lifestyle change  Patient Active Problem List  Diagnosis  . Ureterolithiasis  . Portal vein thrombosis   Past Medical History  Diagnosis Date  . Kidney stones   . Portal vein thrombosis   . Heartburn    Past Surgical History  Procedure Date  . Lasik     left eye  . Anterior cruciate ligament repair 12/2005    left    History  Substance Use Topics  . Smoking status: Never Smoker   . Smokeless tobacco: Never Used  . Alcohol Use: Yes     "1 to 3 per night and a couple weeks with nothing at all"   Family History  Problem Relation Age of Onset  . Diabetes Maternal Grandmother   . Stroke Maternal Grandmother   . Colon cancer Neg Hx   . Gallbladder disease Father   . Hyperlipidemia Mother   . Deep vein thrombosis Maternal Aunt    No Known Allergies Current Outpatient Prescriptions on File Prior to Visit  Medication Sig Dispense Refill  . fondaparinux (ARIXTRA) 10 MG/0.8ML SOLN Inject 0.8 mLs (10 mg total) into the skin daily.  30 Syringe  0  . warfarin (COUMADIN) 5 MG tablet Take 1 tablet (5 mg total) by mouth daily.  30 tablet  1         Review of Systems Review of Systems  Constitutional: Negative for fever, appetite change, fatigue and unexpected weight change.  Eyes: Negative for pain and visual disturbance.  Respiratory: Negative for cough and shortness of breath.   Cardiovascular: Negative for cp or palpitations    Gastrointestinal: Negative for nausea, diarrhea and constipation. neg for abd pain (that is resolved) Genitourinary: Negative for urgency and frequency.  Skin: Negative for pallor or rash   Neurological: Negative for weakness, light-headedness, numbness and headaches.  Hematological: Negative for adenopathy. Does not bruise/bleed easily.  Psychiatric/Behavioral: Negative for dysphoric mood. The patient is not nervous/anxious.         Objective:   Physical Exam  Constitutional: He appears well-nourished. No distress.       overwt and well appearing   HENT:  Head: Normocephalic and atraumatic.  Right Ear: External ear normal.  Left Ear: External ear normal.  Nose: Nose normal.  Mouth/Throat: Oropharynx is clear and moist.  Eyes: Conjunctivae and EOM are normal. Pupils are equal, round, and reactive to light. Right eye exhibits no discharge. Left eye exhibits no discharge. No scleral icterus.  Neck: Normal  range of motion. Neck supple. No JVD present. Carotid bruit is not present. No thyromegaly present.  Cardiovascular: Normal rate, regular rhythm, normal heart sounds and intact distal pulses.  Exam reveals no gallop.   No murmur heard. Pulmonary/Chest: Effort normal and breath sounds normal. No respiratory distress. He has no wheezes.  Abdominal: Soft. Bowel sounds are normal. He exhibits no distension, no abdominal bruit, no ascites and no mass. There is no hepatosplenomegaly. There is no tenderness. There is no rebound, no guarding and negative Murphy's sign.  Musculoskeletal: He exhibits no edema.  Lymphadenopathy:    He has no cervical adenopathy.  Neurological: He is alert. He has  normal reflexes.  Skin: Skin is warm and dry. No rash noted. No erythema. No pallor.  Psychiatric: He has a normal mood and affect.          Assessment & Plan:

## 2012-03-16 NOTE — Patient Instructions (Signed)
7.5 mg daily, continue injections, recheck Monday

## 2012-03-16 NOTE — Patient Instructions (Addendum)
INR today for the coumadin  prilosec is ok if you have acid reflux Set up physical with me in about a month (any 30 min visit) with fasting labs prior

## 2012-03-18 ENCOUNTER — Encounter: Payer: Self-pay | Admitting: Family Medicine

## 2012-03-18 DIAGNOSIS — K760 Fatty (change of) liver, not elsewhere classified: Secondary | ICD-10-CM | POA: Insufficient documentation

## 2012-03-18 NOTE — Assessment & Plan Note (Signed)
This was seen on imaging-no symptoms Disc imp of wt loss and low fat diet Will continue to follow  Lab for PE are upcoming also
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
## 2012-03-19 ENCOUNTER — Other Ambulatory Visit: Payer: Self-pay | Admitting: Oncology

## 2012-03-19 ENCOUNTER — Ambulatory Visit (INDEPENDENT_AMBULATORY_CARE_PROVIDER_SITE_OTHER): Payer: BC Managed Care – PPO | Admitting: Family Medicine

## 2012-03-19 DIAGNOSIS — Z5181 Encounter for therapeutic drug level monitoring: Secondary | ICD-10-CM

## 2012-03-19 DIAGNOSIS — Z7901 Long term (current) use of anticoagulants: Secondary | ICD-10-CM

## 2012-03-19 DIAGNOSIS — I81 Portal vein thrombosis: Secondary | ICD-10-CM

## 2012-03-19 LAB — CARDIOLIPIN ANTIBODIES, IGG, IGM, IGA
Anticardiolipin IgA: 8 APL U/mL — ABNORMAL LOW (ref <22)
Anticardiolipin IgM: 7 MPL U/mL — ABNORMAL LOW (ref <11)

## 2012-03-19 NOTE — Patient Instructions (Addendum)
7.5 mg daily, continue injections, seeing Dr Donnie Coffin in 2 days, will check INR at that visit. Will f/u with patient when we have more information from his visit with Dr Donnie Coffin.

## 2012-03-21 ENCOUNTER — Other Ambulatory Visit (HOSPITAL_BASED_OUTPATIENT_CLINIC_OR_DEPARTMENT_OTHER): Payer: BC Managed Care – PPO

## 2012-03-21 ENCOUNTER — Telehealth: Payer: Self-pay | Admitting: Oncology

## 2012-03-21 ENCOUNTER — Ambulatory Visit (HOSPITAL_BASED_OUTPATIENT_CLINIC_OR_DEPARTMENT_OTHER): Payer: BC Managed Care – PPO | Admitting: Oncology

## 2012-03-21 VITALS — BP 131/89 | HR 87 | Temp 98.9°F | Resp 20 | Ht 73.0 in | Wt 236.9 lb

## 2012-03-21 DIAGNOSIS — I81 Portal vein thrombosis: Secondary | ICD-10-CM

## 2012-03-21 LAB — PROTIME-INR
INR: 3.2 (ref 2.00–3.50)
Protime: 38.4 Seconds — ABNORMAL HIGH (ref 10.6–13.4)

## 2012-03-21 LAB — CBC WITH DIFFERENTIAL/PLATELET
Basophils Absolute: 0 10*3/uL (ref 0.0–0.1)
Eosinophils Absolute: 0 10*3/uL (ref 0.0–0.5)
HCT: 41.9 % (ref 38.4–49.9)
HGB: 14.7 g/dL (ref 13.0–17.1)
MCV: 93.7 fL (ref 79.3–98.0)
MONO%: 9.5 % (ref 0.0–14.0)
NEUT#: 3.1 10*3/uL (ref 1.5–6.5)
NEUT%: 50.1 % (ref 39.0–75.0)
RDW: 12.3 % (ref 11.0–14.6)

## 2012-03-21 NOTE — Telephone Encounter (Signed)
gve the pt his nov 2013 appt calendar °

## 2012-03-25 NOTE — Progress Notes (Signed)
Hematology and Oncology Follow Up Visit  [name] [name] [id number] 01-Mar-1971 41 y.o. 03/25/2012 8:43 PM   DIAGNOSIS:   Encounter Diagnosis  Name Primary?  . Portal vein thrombosis Yes     PAST THERAPY:  IV heparin, Arixtra now and Coumadin.  Interim History:  Is a previously healthy man that is on the hospital. He presented with 2 episodes of abdominal pain ultimately was admitted to hospital and was found to have portal vein thrombus on CT scan. There is a questionable liver mass seen. On hospital MRI scan of the liver was performed and this did not confirm the presence of a liver mass. It was felt that the ulceration in the flow blood flow around of the liver it created the appearance of the mass. He has been on Arixtra and Coumadin and is now therapeutic with an INR of the prostate 3. He is being followed by her primary care Dr. as well.  Medications: I have reviewed the patient's current medications.  Allergies: No Known Allergies  Past Medical History, Surgical history, Social history, and Family History were reviewed and updated.  Review of Systems: Constitutional:  Negative for fever, chills, night sweats, anorexia, weight loss, pain. Cardiovascular: no chest pain or dyspnea on exertion Respiratory: no cough, shortness of breath, or wheezing Neurological: negative Dermatological: negative ENT: negative Skin Gastrointestinal: negative Genito-Urinary: negative Hematological and Lymphatic: negative Breast: negative Musculoskeletal: negative Remaining ROS negative.  Physical Exam:  Blood pressure 131/89, pulse 87, temperature 98.9 F (37.2 C), temperature source Oral, resp. rate 20, height 6\' 1"  (1.854 m), weight 236 lb 14.4 oz (107.457 kg).  ECOG: 0   General appearance: alert, cooperative and appears stated age Head: Normocephalic, without obvious abnormality, atraumatic Back: symmetric, no curvature. ROM normal. No CVA tenderness. Resp: clear to auscultation  bilaterally and normal percussion bilaterally Chest wall: no tenderness Cardio: regular rate and rhythm, S1, S2 normal, no murmur, click, rub or gallop GI: soft, non-tender; bowel sounds normal; no masses,  no organomegaly Extremities: extremities normal, atraumatic, no cyanosis or edema Pulses: 2+ and symmetric Lymph nodes: Cervical, supraclavicular, and axillary nodes normal. Neurologic: Grossly normal   Lab Results: Lab Results  Component Value Date   WBC 6.1 03/21/2012   HGB 14.7 03/21/2012   HCT 41.9 03/21/2012   MCV 93.7 03/21/2012   PLT 308 03/21/2012     Chemistry      Component Value Date/Time   NA 139 03/13/2012 1825   K 4.1 03/13/2012 1825   CL 102 03/13/2012 1825   CO2 28 03/13/2012 1825   BUN 10 03/13/2012 1825   CREATININE 1.10 03/13/2012 1825      Component Value Date/Time   CALCIUM 9.4 03/13/2012 1825   ALKPHOS 78 03/13/2012 1825   AST 33 03/13/2012 1825   ALT 89* 03/13/2012 1825   BILITOT 0.3 03/13/2012 1825       Radiological Studies:  Mr Abdomen W Wo Contrast  03/14/2012  *RADIOLOGY REPORT*  Clinical Data: Abdominal pain, nausea and vomiting.  Portal vein thrombosis identified on recent CT scan.  Ill-defined liver lesion also noted.  MRI ABDOMEN WITH AND WITHOUT CONTRAST  Technique:  Multiplanar multisequence MR imaging of the abdomen was performed both before and after administration of intravenous contrast.  Contrast: 20mL MULTIHANCE GADOBENATE DIMEGLUMINE 529 MG/ML IV SOLN  Comparison: CT of the abdomen and pelvis 03/13/2012.  Findings:  There is a filling defect within the distal portal vein immediately before the bifurcation, with some extension of nonocclusive clot  into the right portal vein proximally, but complete thrombosis of the left portal vein branch.  The superior mesenteric vein and splenic vein both appear to be patent.  On post-gadolinium images, there is relative hypoperfusion of the right lobe of the liver as compared to the left lobe of the liver on early arterial  phase images, presumably secondary to augmented arterial flow to the left lobe of the liver (related to the left portal vein thrombosis). Additionally, there is marked loss of signal intensity throughout the right lobe of the liver on out of phase dual echo images, compatible with hepatic steatosis.  In segment 2 of the liver in the area of suspected mass on recent CT examination, there is no well-defined solid lesion identified. There is a subtle area of serpiginous T2 signal abnormality on image 15 of series 3 which corresponds to a very subtle area of early arterial phase hypoperfusion (image 30 of series 1101) which subsequently becomes nondescript on portal venous phase and [name] delayed phase post gadolinium images.  Overall, this area is nonspecific, but is strongly favored to represent a perfusion anomaly.  The appearance of the pancreas, spleen, bilateral adrenal glands and bilateral kidneys is unremarkable.  IMPRESSION: 1.  Portal vein thrombosis.  Specifically, there is nonocclusive clot in the distal main portal vein extending into the proximal right portal vein branch, but complete thrombosis of the left portal vein branch.  Associated with this is altered perfusion in the liver, as detailed above, including a perfusion anomaly in segment 2 of the liver (which accounts for the perceived lesion on recent CT scan in this region).  Additionally, there is marked hepatic steatosis of the right lobe of the liver. 2.  No definite focal hepatic mass identified.  These results were called by telephone on 03/13/2012 at 09:38 a.m. to Dr. Christella Hartigan, who verbally acknowledged these results.   Original Report Authenticated By: Florencia Reasons, M.D.    Ct Abdomen Pelvis W Contrast  03/13/2012  *RADIOLOGY REPORT*  Clinical Data: Abdominal pain.  Back pain.  Nausea and vomiting. Diffuse abdominal tenderness.  The  CT ABDOMEN AND PELVIS WITH CONTRAST  Technique:  Multidetector CT imaging of the abdomen and pelvis was  performed following the standard protocol during bolus administration of intravenous contrast.  Contrast: OMNIPAQUE IOHEXOL 300 MG/ML  SOLN  Comparison: 12/28/2007  Findings: Thrombosis of the left portal vein is seen as well as nonocclusive thrombus within the right portal vein.  The main portal vein and superior mesenteric vein remains patent.  Geographic pattern of hepatic steatosis is demonstrated involving the right hepatic lobe.  There is also an ill defined rounded hypovascular area in the superior left hepatic lobe (segment 2) which measures approximately 3 cm.  Hepatic neoplasm cannot be excluded.  No other definite liver masses are identified.  The gallbladder, pancreas, spleen, adrenal glands, and kidneys are normal appearance.  No other soft tissue masses or lymphadenopathy identified within the abdomen or pelvis.  No evidence of bowel wall thickening or dilatation.  Normal appendix is visualized.  No evidence of inflammatory process or abnormal fluid collections. Small bilateral inguinal hernias are again seen only containing fat.  No evidence of herniated bowel. No suspicious bone lesions identified.  IMPRESSION:  1.  Left portal vein thrombosis, and nonocclusive thrombus within the right portal vein. 2.  3 cm ill-defined hypovascular lesion in the lateral segment of the left hepatic lobe superiorly (segment 2);  hepatic neoplasm cannot be excluded.  Recommend abdomen  MRI without and with contrast for further evaluation. 3.  Stable small bilateral fat containing inguinal hernias.  These results were called by telephone on 03/13/2012 at 1405 hours to [facility], who verbally acknowledged these results.   Original Report Authenticated By: Danae Orleans, M.D.      IMPRESSIONS AND PLAN: A 41 y.o. male with   History of portal vein thrombus on coumadin. F/U MRI did not reveal an obvious anantomical reason for this, nor does he appear to have a coagulopathy. We will continue coumadin for at  least 6 months and then reevaluate. His screening for cancer was similiarly negative, though he might benefit from a colonoscopy.  Spent [name] than half the time coordinating care, as well as discussion of BMI and its implications.      Louana Fontenot 9/15/20138:43 PM Cell [phone]

## 2012-03-27 ENCOUNTER — Ambulatory Visit (INDEPENDENT_AMBULATORY_CARE_PROVIDER_SITE_OTHER): Payer: BC Managed Care – PPO | Admitting: Family Medicine

## 2012-03-27 DIAGNOSIS — Z5181 Encounter for therapeutic drug level monitoring: Secondary | ICD-10-CM

## 2012-03-27 DIAGNOSIS — I81 Portal vein thrombosis: Secondary | ICD-10-CM

## 2012-03-27 DIAGNOSIS — Z7901 Long term (current) use of anticoagulants: Secondary | ICD-10-CM

## 2012-03-27 LAB — POCT INR: INR: 5.6

## 2012-03-27 NOTE — Patient Instructions (Signed)
Hold x 2 , 5 mg Thur check on Fri

## 2012-03-30 ENCOUNTER — Ambulatory Visit (INDEPENDENT_AMBULATORY_CARE_PROVIDER_SITE_OTHER): Payer: BC Managed Care – PPO | Admitting: Family Medicine

## 2012-03-30 DIAGNOSIS — Z7901 Long term (current) use of anticoagulants: Secondary | ICD-10-CM

## 2012-03-30 DIAGNOSIS — Z5181 Encounter for therapeutic drug level monitoring: Secondary | ICD-10-CM

## 2012-03-30 DIAGNOSIS — I81 Portal vein thrombosis: Secondary | ICD-10-CM

## 2012-03-30 NOTE — Patient Instructions (Signed)
Decrease to 7.5/5/5/7.5/5/5 recheck mid next week

## 2012-04-05 ENCOUNTER — Ambulatory Visit: Payer: BC Managed Care – PPO

## 2012-04-05 ENCOUNTER — Ambulatory Visit (INDEPENDENT_AMBULATORY_CARE_PROVIDER_SITE_OTHER): Payer: BC Managed Care – PPO | Admitting: Family Medicine

## 2012-04-05 DIAGNOSIS — Z7901 Long term (current) use of anticoagulants: Secondary | ICD-10-CM

## 2012-04-05 DIAGNOSIS — Z5181 Encounter for therapeutic drug level monitoring: Secondary | ICD-10-CM

## 2012-04-05 DIAGNOSIS — I81 Portal vein thrombosis: Secondary | ICD-10-CM

## 2012-04-05 NOTE — Patient Instructions (Addendum)
7.5/5/5/7.5/5/5 2 weeks, Continue current dose, check in 2 weeks

## 2012-04-16 ENCOUNTER — Other Ambulatory Visit: Payer: BC Managed Care – PPO

## 2012-04-16 ENCOUNTER — Telehealth: Payer: Self-pay | Admitting: Family Medicine

## 2012-04-16 DIAGNOSIS — Z0000 Encounter for general adult medical examination without abnormal findings: Secondary | ICD-10-CM | POA: Insufficient documentation

## 2012-04-16 DIAGNOSIS — R5383 Other fatigue: Secondary | ICD-10-CM

## 2012-04-16 NOTE — Telephone Encounter (Signed)
Requested we check testosterone level for fatigue

## 2012-04-16 NOTE — Telephone Encounter (Signed)
Message copied by Judy Pimple on Mon Apr 16, 2012  7:50 AM ------      Message from: Alvina Chou      Created: Thu Apr 12, 2012  9:35 AM      Regarding: lab orders for Monday,10.7.13       Patient is scheduled for CPX labs, please order future labs, Thanks , Camelia Eng

## 2012-04-17 ENCOUNTER — Other Ambulatory Visit: Payer: BC Managed Care – PPO

## 2012-04-18 ENCOUNTER — Ambulatory Visit: Payer: BC Managed Care – PPO | Admitting: Family Medicine

## 2012-04-18 ENCOUNTER — Other Ambulatory Visit (INDEPENDENT_AMBULATORY_CARE_PROVIDER_SITE_OTHER): Payer: BC Managed Care – PPO

## 2012-04-18 DIAGNOSIS — R5383 Other fatigue: Secondary | ICD-10-CM

## 2012-04-18 DIAGNOSIS — R5381 Other malaise: Secondary | ICD-10-CM

## 2012-04-18 DIAGNOSIS — Z0000 Encounter for general adult medical examination without abnormal findings: Secondary | ICD-10-CM

## 2012-04-18 LAB — TSH: TSH: 2.2 u[IU]/mL (ref 0.35–5.50)

## 2012-04-18 LAB — LIPID PANEL
Total CHOL/HDL Ratio: 7
Triglycerides: 483 mg/dL — ABNORMAL HIGH (ref 0.0–149.0)

## 2012-04-18 LAB — CBC WITH DIFFERENTIAL/PLATELET
Eosinophils Absolute: 0.2 10*3/uL (ref 0.0–0.7)
MCHC: 33.5 g/dL (ref 30.0–36.0)
MCV: 98.8 fl (ref 78.0–100.0)
Monocytes Absolute: 0.9 10*3/uL (ref 0.1–1.0)
Neutrophils Relative %: 47.7 % (ref 43.0–77.0)
Platelets: 288 10*3/uL (ref 150.0–400.0)
WBC: 7.8 10*3/uL (ref 4.5–10.5)

## 2012-04-18 LAB — COMPREHENSIVE METABOLIC PANEL
AST: 30 U/L (ref 0–37)
Albumin: 4.1 g/dL (ref 3.5–5.2)
Alkaline Phosphatase: 66 U/L (ref 39–117)
Potassium: 3.9 mEq/L (ref 3.5–5.1)
Sodium: 139 mEq/L (ref 135–145)
Total Protein: 7.3 g/dL (ref 6.0–8.3)

## 2012-04-18 LAB — LDL CHOLESTEROL, DIRECT: Direct LDL: 176.2 mg/dL

## 2012-04-20 ENCOUNTER — Ambulatory Visit (INDEPENDENT_AMBULATORY_CARE_PROVIDER_SITE_OTHER): Payer: BC Managed Care – PPO | Admitting: Family Medicine

## 2012-04-20 ENCOUNTER — Ambulatory Visit: Payer: BC Managed Care – PPO | Admitting: Family Medicine

## 2012-04-20 ENCOUNTER — Encounter: Payer: BC Managed Care – PPO | Admitting: Family Medicine

## 2012-04-20 DIAGNOSIS — I81 Portal vein thrombosis: Secondary | ICD-10-CM

## 2012-04-20 DIAGNOSIS — Z5181 Encounter for therapeutic drug level monitoring: Secondary | ICD-10-CM

## 2012-04-20 DIAGNOSIS — Z7901 Long term (current) use of anticoagulants: Secondary | ICD-10-CM

## 2012-04-20 LAB — POCT INR: INR: 2.3

## 2012-04-20 NOTE — Patient Instructions (Signed)
Continue current dose, check in 4 weeks  

## 2012-04-23 ENCOUNTER — Ambulatory Visit (INDEPENDENT_AMBULATORY_CARE_PROVIDER_SITE_OTHER): Payer: BC Managed Care – PPO | Admitting: Family Medicine

## 2012-04-23 ENCOUNTER — Encounter: Payer: Self-pay | Admitting: Family Medicine

## 2012-04-23 VITALS — BP 126/88 | HR 82 | Temp 98.5°F | Ht 72.75 in | Wt 236.8 lb

## 2012-04-23 DIAGNOSIS — R5383 Other fatigue: Secondary | ICD-10-CM

## 2012-04-23 DIAGNOSIS — Z0000 Encounter for general adult medical examination without abnormal findings: Secondary | ICD-10-CM

## 2012-04-23 DIAGNOSIS — E785 Hyperlipidemia, unspecified: Secondary | ICD-10-CM | POA: Insufficient documentation

## 2012-04-23 DIAGNOSIS — Z23 Encounter for immunization: Secondary | ICD-10-CM

## 2012-04-23 DIAGNOSIS — I81 Portal vein thrombosis: Secondary | ICD-10-CM

## 2012-04-23 DIAGNOSIS — R5381 Other malaise: Secondary | ICD-10-CM

## 2012-04-23 MED ORDER — WARFARIN SODIUM 5 MG PO TABS: ORAL_TABLET | ORAL | Status: DC

## 2012-04-23 NOTE — Patient Instructions (Addendum)
Flu shot today  Will send coumadin to your pharmacy  Will fill out your FMLA paperwork when it arrives  Avoid red meat/ fried foods/ egg yolks/ fatty breakfast meats/ butter, cheese and high fat dairy/ and shellfish   Schedule fasting labs in 2-3 months for cholesterol profile and then follow up  Work up to exercise 3 days per week
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
## 2012-04-23 NOTE — Progress Notes (Signed)
Subjective:    Patient ID: [name] [name], male    DOB: [birth year], 41 y.o.   MRN: [id number]  HPI Here for health maintenance exam and to review chronic medical problems    Grandfather is in ICU- worrisome  Grandmother has dementia - this is hard on the family  Has been seeing heme- for portal vein thrombosis Everything is going fine- symptom free- no abd pain or vomiting  Has f/u with Dr Donnie Coffin and also at [facility] On coumadin -not a lot of side eff , just bruises easily   His company denied his [employer] is stable with bmi of 31 Diet- better than what it has been - eating more vegetables and more fruit (wary of coumadin ) Exercise-active job-on his feet- nothing extra  irratic schedule - travels at times   Comes home from work between 5-10 Could start walking  Also likes mt biking    Lipids Lab Results  Component Value Date   CHOL 282* 04/18/2012   HDL 39.40 04/18/2012   LDLDIRECT 176.2 04/18/2012   TRIG 483.0 /htig* 04/18/2012   CHOLHDL 7 04/18/2012   diet - fair  Alcohol anywhere from 3 servings per week to 10-12 per week  He did drink more than average the night before - ?reason why trig high   Flu vaccine - was resistant but agreed to get one today after discussing it    Last INR 2.3   Low 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 Ureterolithiasis  . Portal vein thrombosis  . Fatty liver  . Routine general medical examination at a health care facility  . Fatigue   Past Medical History  Diagnosis Date  . Kidney stones   . Portal vein thrombosis   . Heartburn   . Hepatic steatosis     asymptomatic   Past Surgical History  Procedure Date  . Lasik     left eye  . Anterior cruciate ligament repair 12/2005    left    History  Substance Use Topics  . Smoking  status: Never Smoker   . Smokeless tobacco: Never Used  . Alcohol Use: Yes     "1 to 3 per night and a couple weeks with nothing at all"   Family History  Problem Relation Age of Onset  . Diabetes Maternal Grandmother   . Stroke Maternal Grandmother   . Colon cancer Neg Hx   . Gallbladder disease Father   . Hyperlipidemia Mother   . Deep vein thrombosis Maternal Aunt    No Known Allergies Current Outpatient Prescriptions on File Prior to Visit  Medication Sig Dispense Refill  . warfarin (COUMADIN) 5 MG tablet Take 1 tablet (5 mg total) by mouth daily.  30 tablet  1       Review of Systems Review of Systems  Constitutional: Negative for fever, appetite change,  and unexpected weight change. is fatigued at times Eyes: Negative for pain and visual disturbance.  Respiratory: Negative for cough and shortness of breath.   Cardiovascular: Negative for cp or palpitations    Gastrointestinal: Negative for nausea, diarrhea and constipation.  Genitourinary: Negative for urgency and frequency.  Skin: Negative for pallor or rash   Neurological: Negative for weakness, light-headedness, numbness and  headaches.  Hematological: Negative for adenopathy. Does not bruise/bleed easily.  Psychiatric/Behavioral: Negative for dysphoric mood. The patient is not nervous/anxious.         Objective:   Physical Exam  Constitutional: He appears well-developed and well-nourished. No distress.       overwt and well appearing   HENT:  Head: Normocephalic and atraumatic.  Right Ear: External ear normal.  Left Ear: External ear normal.  Nose: Nose normal.  Mouth/Throat: Oropharynx is clear and moist.  Eyes: Conjunctivae normal and EOM are normal. Pupils are equal, round, and reactive to light. No scleral icterus.  Neck: Normal range of motion. Neck supple. No JVD present. Carotid bruit is not present. No thyromegaly present.  Cardiovascular: Normal rate, regular rhythm, normal heart sounds and intact  distal pulses.  Exam reveals no gallop.   No murmur heard. Pulmonary/Chest: Effort normal and breath sounds normal. No respiratory distress. He has no wheezes.  Abdominal: Soft. Bowel sounds are normal. He exhibits no distension, no abdominal bruit and no mass. There is no tenderness.  Musculoskeletal: Normal range of motion. He exhibits no edema and no tenderness.  Lymphadenopathy:    He has no cervical adenopathy.  Neurological: He is alert. He has normal reflexes. No cranial nerve deficit. He exhibits normal muscle tone. Coordination normal.  Skin: Skin is warm and dry. No rash noted. No erythema. No pallor.  Psychiatric: He has a normal mood and affect.          Assessment & Plan:

## 2012-04-23 NOTE — Assessment & Plan Note (Signed)
Reviewed health habits including diet and exercise and skin cancer prevention Also reviewed health mt list, fam hx and immunizations  Reviewed wellness labs  Will start working on healthier diet and exercise

## 2012-04-27 ENCOUNTER — Telehealth: Payer: Self-pay | Admitting: *Deleted

## 2012-04-27 NOTE — Telephone Encounter (Signed)
done

## 2012-04-27 NOTE — Telephone Encounter (Signed)
FMLA forms in your IN box.

## 2012-04-29 NOTE — Assessment & Plan Note (Signed)
Lipids / trig are high Disc diet in detail and alcohol intake Rev low sat fat diet Will make effort to do better and then re check with f/u planned Disc poss risks of high chol

## 2012-04-29 NOTE — Assessment & Plan Note (Signed)
Testosterone is mildly low and pt does not want further eval and tx for that at this time Disc imp of healthy diet and exercise

## 2012-05-18 ENCOUNTER — Ambulatory Visit (INDEPENDENT_AMBULATORY_CARE_PROVIDER_SITE_OTHER): Payer: BC Managed Care – PPO | Admitting: Family Medicine

## 2012-05-18 DIAGNOSIS — Z7901 Long term (current) use of anticoagulants: Secondary | ICD-10-CM

## 2012-05-18 DIAGNOSIS — I81 Portal vein thrombosis: Secondary | ICD-10-CM

## 2012-05-18 DIAGNOSIS — Z5181 Encounter for therapeutic drug level monitoring: Secondary | ICD-10-CM

## 2012-05-18 NOTE — Patient Instructions (Signed)
Continue current dose, check in 4 weeks  

## 2012-05-25 ENCOUNTER — Other Ambulatory Visit (HOSPITAL_BASED_OUTPATIENT_CLINIC_OR_DEPARTMENT_OTHER): Payer: BC Managed Care – PPO

## 2012-05-25 ENCOUNTER — Ambulatory Visit (HOSPITAL_BASED_OUTPATIENT_CLINIC_OR_DEPARTMENT_OTHER): Payer: BC Managed Care – PPO | Admitting: Oncology

## 2012-05-25 ENCOUNTER — Telehealth: Payer: Self-pay | Admitting: *Deleted

## 2012-05-25 VITALS — BP 128/85 | HR 71 | Temp 97.8°F | Resp 20 | Ht 72.75 in | Wt 239.2 lb

## 2012-05-25 DIAGNOSIS — I81 Portal vein thrombosis: Secondary | ICD-10-CM

## 2012-05-25 LAB — COMPREHENSIVE METABOLIC PANEL (CC13)
ALT: 50 U/L (ref 0–55)
AST: 28 U/L (ref 5–34)
Alkaline Phosphatase: 74 U/L (ref 40–150)
CO2: 26 mEq/L (ref 22–29)
Sodium: 139 mEq/L (ref 136–145)
Total Bilirubin: 0.67 mg/dL (ref 0.20–1.20)
Total Protein: 7.5 g/dL (ref 6.4–8.3)

## 2012-05-25 LAB — CBC WITH DIFFERENTIAL/PLATELET
Eosinophils Absolute: 0 10*3/uL (ref 0.0–0.5)
LYMPH%: 35.3 % (ref 14.0–49.0)
MCV: 96.8 fL (ref 79.3–98.0)
MONO%: 11.7 % (ref 0.0–14.0)
NEUT#: 3 10*3/uL (ref 1.5–6.5)
NEUT%: 51.8 % (ref 39.0–75.0)
Platelets: 231 10*3/uL (ref 140–400)
RBC: 4.58 10*6/uL (ref 4.20–5.82)

## 2012-05-25 LAB — PROTIME-INR: Protime: 16.8 Seconds — ABNORMAL HIGH (ref 10.6–13.4)

## 2012-05-25 NOTE — Telephone Encounter (Signed)
Gave patient appointment for 08-2012 gave medical records a request to contact [facility] per the request from [email or web address]

## 2012-05-25 NOTE — Progress Notes (Signed)
Hematology and Oncology Follow Up Visit  [name] [name] [id number] 12/31/1970 41 y.o. 05/25/2012 2:29 PM   DIAGNOSIS:  Portal vein thrombosis    PAST THERAPY:  IV heparin, Arixtra now and Coumadin.  41 yo male with no apparent risk factors who presents with portal vein thrombosis, currently on therapeutic anticoagulation. He has been doing well, in general his INR has been therapeutic on a stable dose of coyumadin. He has had no bleeding he is working ful time . He has not had his referral to [facility] coag service.  Medications: I have reviewed the patient's current medications.  Allergies: No Known Allergies  Past Medical History, Surgical history, Social history, and Family History were reviewed and updated.  Review of Systems: Constitutional:  Negative for fever, chills, night sweats, anorexia, weight loss, pain. Cardiovascular: no chest pain or dyspnea on exertion Respiratory: no cough, shortness of breath, or wheezing Neurological: negative Dermatological: negative ENT: negative Skin Gastrointestinal: negative Genito-Urinary: negative Hematological and Lymphatic: negative Breast: negative Musculoskeletal: negative Remaining ROS negative.  Physical Exam:  Blood pressure 128/85, pulse 71, temperature 97.8 F (36.6 C), temperature source Oral, resp. rate 20, height 6' 0.75" (1.848 m), weight 239 lb 3.2 oz (108.5 kg).  ECOG: 0   General appearance: alert, cooperative and appears stated age Head: Normocephalic, without obvious abnormality, atraumatic Back: symmetric, no curvature. ROM normal. No CVA tenderness. Resp: clear to auscultation bilaterally and normal percussion bilaterally Chest wall: no tenderness Cardio: regular rate and rhythm, S1, S2 normal, no murmur, click, rub or gallop GI: soft, non-tender; bowel sounds normal; no masses,  no organomegaly Extremities: extremities normal, atraumatic, no cyanosis or edema Pulses: 2+ and symmetric Lymph nodes: Cervical,  supraclavicular, and axillary nodes normal. Neurologic: Grossly normal   Lab Results: Lab Results  Component Value Date   WBC 5.9 05/25/2012   HGB 15.5 05/25/2012   HCT 44.3 05/25/2012   MCV 96.8 05/25/2012   PLT 231 05/25/2012     Chemistry      Component Value Date/Time   NA 139 04/18/2012 0920   K 3.9 04/18/2012 0920   CL 106 04/18/2012 0920   CO2 26 04/18/2012 0920   BUN 16 04/18/2012 0920   CREATININE 1.1 04/18/2012 0920      Component Value Date/Time   CALCIUM 9.3 04/18/2012 0920   ALKPHOS 66 04/18/2012 0920   AST 30 04/18/2012 0920   ALT 46 04/18/2012 0920   BILITOT 0.7 04/18/2012 0920       Radiological Studies:  Mr Abdomen W Wo Contrast  03/14/2012  *RADIOLOGY REPORT*  Clinical Data: Abdominal pain, nausea and vomiting.  Portal vein thrombosis identified on recent CT scan.  Ill-defined liver lesion also noted.  MRI ABDOMEN WITH AND WITHOUT CONTRAST  Technique:  Multiplanar multisequence MR imaging of the abdomen was performed both before and after administration of intravenous contrast.  Contrast: 20mL MULTIHANCE GADOBENATE DIMEGLUMINE 529 MG/ML IV SOLN  Comparison: CT of the abdomen and pelvis 03/13/2012.  Findings:  There is a filling defect within the distal portal vein immediately before the bifurcation, with some extension of nonocclusive clot into the right portal vein proximally, but complete thrombosis of the left portal vein branch.  The superior mesenteric vein and splenic vein both appear to be patent.  On post-gadolinium images, there is relative hypoperfusion of the right lobe of the liver as compared to the left lobe of the liver on early arterial phase images, presumably secondary to augmented arterial flow to the left lobe of  the liver (related to the left portal vein thrombosis). Additionally, there is marked loss of signal intensity throughout the right lobe of the liver on out of phase dual echo images, compatible with hepatic steatosis.  In segment 2 of the liver  in the area of suspected mass on recent CT examination, there is no well-defined solid lesion identified. There is a subtle area of serpiginous T2 signal abnormality on image 15 of series 3 which corresponds to a very subtle area of early arterial phase hypoperfusion (image 30 of series 1101) which subsequently becomes nondescript on portal venous phase and more delayed phase post gadolinium images.  Overall, this area is nonspecific, but is strongly favored to represent a perfusion anomaly.  The appearance of the pancreas, spleen, bilateral adrenal glands and bilateral kidneys is unremarkable.  IMPRESSION: 1.  Portal vein thrombosis.  Specifically, there is nonocclusive clot in the distal main portal vein extending into the proximal right portal vein branch, but complete thrombosis of the left portal vein branch.  Associated with this is altered perfusion in the liver, as detailed above, including a perfusion anomaly in segment 2 of the liver (which accounts for the perceived lesion on recent CT scan in this region).  Additionally, there is marked hepatic steatosis of the right lobe of the liver. 2.  No definite focal hepatic mass identified.  These results were called by telephone on 03/13/2012 at 09:38 a.m. to Dr. Christella Hartigan, who verbally acknowledged these results.   Original Report Authenticated By: Florencia Reasons, M.D.    Ct Abdomen Pelvis W Contrast  03/13/2012  *RADIOLOGY REPORT*  Clinical Data: Abdominal pain.  Back pain.  Nausea and vomiting. Diffuse abdominal tenderness.  The  CT ABDOMEN AND PELVIS WITH CONTRAST  Technique:  Multidetector CT imaging of the abdomen and pelvis was performed following the standard protocol during bolus administration of intravenous contrast.  Contrast: OMNIPAQUE IOHEXOL 300 MG/ML  SOLN  Comparison: 12/28/2007  Findings: Thrombosis of the left portal vein is seen as well as nonocclusive thrombus within the right portal vein.  The main portal vein and superior  mesenteric vein remains patent.  Geographic pattern of hepatic steatosis is demonstrated involving the right hepatic lobe.  There is also an ill defined rounded hypovascular area in the superior left hepatic lobe (segment 2) which measures approximately 3 cm.  Hepatic neoplasm cannot be excluded.  No other definite liver masses are identified.  The gallbladder, pancreas, spleen, adrenal glands, and kidneys are normal appearance.  No other soft tissue masses or lymphadenopathy identified within the abdomen or pelvis.  No evidence of bowel wall thickening or dilatation.  Normal appendix is visualized.  No evidence of inflammatory process or abnormal fluid collections. Small bilateral inguinal hernias are again seen only containing fat.  No evidence of herniated bowel. No suspicious bone lesions identified.  IMPRESSION:  1.  Left portal vein thrombosis, and nonocclusive thrombus within the right portal vein. 2.  3 cm ill-defined hypovascular lesion in the lateral segment of the left hepatic lobe superiorly (segment 2);  hepatic neoplasm cannot be excluded.  Recommend abdomen MRI without and with contrast for further evaluation. 3.  Stable small bilateral fat containing inguinal hernias.  These results were called by telephone on 03/13/2012 at 1405 hours to [facility], who verbally acknowledged these results.   Original Report Authenticated By: Danae Orleans, M.D.      IMPRESSIONS AND PLAN: A 41 y.o. male with   Hx of portal vein thrombosis on  coumadin. F/U in 3 months . He might benefit from a f/u scan of his liver. Spent more than half the time coordinating care, as well as discussion of BMI and its implications.      [name] [name] 11/15/20132:29 PM Cell [id number]

## 2012-05-28 LAB — FACTOR 5 LEIDEN

## 2012-06-12 ENCOUNTER — Ambulatory Visit (INDEPENDENT_AMBULATORY_CARE_PROVIDER_SITE_OTHER): Payer: BC Managed Care – PPO | Admitting: General Practice

## 2012-06-12 ENCOUNTER — Telehealth: Payer: Self-pay | Admitting: Oncology

## 2012-06-12 DIAGNOSIS — I81 Portal vein thrombosis: Secondary | ICD-10-CM

## 2012-06-12 NOTE — Telephone Encounter (Signed)
Pt appt. With Dr. Cherie Dark @ [facility] 08/03/12 @ 2:30. Medical; records faxed. Pt is aware

## 2012-06-15 ENCOUNTER — Ambulatory Visit: Payer: BC Managed Care – PPO

## 2012-06-19 ENCOUNTER — Telehealth: Payer: Self-pay | Admitting: *Deleted

## 2012-06-19 NOTE — Telephone Encounter (Signed)
FMLA forms in your IN box for recent coumadin clinic visit. Patient advised he may need one filled out for each coumadin check with the new clinic schedule due to having to leave work. He just wanted you to be aware. Please return them to me prior to faxing as he will need to sign them. Thanks!

## 2012-06-19 NOTE — Telephone Encounter (Signed)
I will get them back to you

## 2012-06-20 NOTE — Telephone Encounter (Signed)
Patient notified and after confirming with his supervisor, the forms from before will cover the visits, so he will not need any additional ones completed for now. He said thank you very much!

## 2012-06-20 NOTE — Telephone Encounter (Signed)
Any time- keep me posted

## 2012-06-28 ENCOUNTER — Ambulatory Visit (INDEPENDENT_AMBULATORY_CARE_PROVIDER_SITE_OTHER): Payer: BC Managed Care – PPO | Admitting: General Practice

## 2012-06-28 DIAGNOSIS — I81 Portal vein thrombosis: Secondary | ICD-10-CM

## 2012-06-28 LAB — POCT INR: INR: 2.2

## 2012-07-05 ENCOUNTER — Telehealth: Payer: Self-pay | Admitting: *Deleted

## 2012-07-05 NOTE — Telephone Encounter (Signed)
I wrote a px for a standing order - if he needs individual ones as we go he can just have [place] fax those to where they need to go  Order is in the IN box

## 2012-07-05 NOTE — Telephone Encounter (Signed)
Patient notified me he will be working out of town in [place] (leaving 07/09/12) at least through February 14,2014. He will need some written scripts to take to a lab or [facility] there to have his PT/INR checked and sent to you. He would like to pick these up tomorrow if possible. I will also reschedule his follow up with you after his return. He will come back for his appt with [facility] and then go back to [place].

## 2012-07-05 NOTE — Telephone Encounter (Signed)
I rescheduled his follow up for tomorrow, so he can pick up the scripts at that time.

## 2012-07-06 ENCOUNTER — Ambulatory Visit (INDEPENDENT_AMBULATORY_CARE_PROVIDER_SITE_OTHER): Payer: BC Managed Care – PPO | Admitting: Family Medicine

## 2012-07-06 ENCOUNTER — Encounter: Payer: Self-pay | Admitting: Family Medicine

## 2012-07-06 VITALS — BP 122/84 | HR 74 | Temp 98.1°F | Ht 72.75 in | Wt 239.0 lb

## 2012-07-06 DIAGNOSIS — I81 Portal vein thrombosis: Secondary | ICD-10-CM

## 2012-07-06 DIAGNOSIS — K760 Fatty (change of) liver, not elsewhere classified: Secondary | ICD-10-CM

## 2012-07-06 DIAGNOSIS — K7689 Other specified diseases of liver: Secondary | ICD-10-CM

## 2012-07-06 DIAGNOSIS — E785 Hyperlipidemia, unspecified: Secondary | ICD-10-CM

## 2012-07-06 NOTE — Progress Notes (Signed)
Subjective:    Patient ID: [name] [name], male    DOB: August 23, 1970, 41 y.o.   MRN: [id number]  HPI Here for f/u of chronic medical problems   Has feeling ok    Wt is stable with bmi of 31  Portal vein thrombosis [facility]--appt is jan 21st - is looking forward to getting some more information and a plan   Was supposed to leave town from work , and decided not to go His step father is having knee surgery  Order for INRs  Fatty liver Lab Results  Component Value Date   ALT 50 05/25/2012   AST 28 05/25/2012   ALKPHOS 74 05/25/2012   BILITOT 0.67 05/25/2012   Alcohol intake - is about the same -not excessive   Lab Results  Component Value Date   CHOL 282* 04/18/2012   Lab Results  Component Value Date   HDL 39.40 04/18/2012   No results found for this basename: [facility]   Lab Results  Component Value Date   TRIG 483.0 /htig* 04/18/2012   Lab Results  Component Value Date   CHOLHDL 7 04/18/2012   Lab Results  Component Value Date   LDLDIRECT 176.2 04/18/2012    Diet - overall is eating better  Less red meat and less fried food  Exception - the holidays but otherwise doing better   Thinking about starting to ride a bike again for exercise   INR is fairly stable - has forgotten a few doses but otherwise quite stable Will start setting alarm on his cell phone for the dosing     Patient Active Problem List  Diagnosis  . Ureterolithiasis  . Portal vein thrombosis  . Fatty liver  . Routine general medical examination at a health care facility  . Fatigue  . Hyperlipidemia   Past Medical History  Diagnosis Date  . Kidney stones   . Portal vein thrombosis   . Heartburn   . Hepatic steatosis     asymptomatic   Past Surgical History  Procedure Date  . Lasik     left eye  . Anterior cruciate ligament repair 12/2005    left    History  Substance Use Topics  . Smoking status: Never Smoker   . Smokeless tobacco: Never Used  . Alcohol Use: Yes     Comment: "1  to 3 per night and a couple weeks with nothing at all"   Family History  Problem Relation Age of Onset  . Diabetes Maternal Grandmother   . Stroke Maternal Grandmother   . Colon cancer Neg Hx   . Gallbladder disease Father   . Hyperlipidemia Mother   . Deep vein thrombosis Maternal Aunt    No Known Allergies Current Outpatient Prescriptions on File Prior to Visit  Medication Sig Dispense Refill  . warfarin (COUMADIN) 5 MG tablet Take by mouth as directed  Alt 5mg  and 7.5mg          Review of Systems Review of Systems  Constitutional: Negative for fever, appetite change, fatigue and unexpected weight change.  Eyes: Negative for pain and visual disturbance.  Respiratory: Negative for cough and shortness of breath.   Cardiovascular: Negative for cp or palpitations    Gastrointestinal: Negative for nausea, diarrhea and constipation. occ twinge of abd pain but otherwise that is gone  Genitourinary: Negative for urgency and frequency.  Skin: Negative for pallor or rash   Neurological: Negative for weakness, light-headedness, numbness and headaches.  Hematological: Negative for adenopathy. Does not  bruise/bleed easily.  Psychiatric/Behavioral: Negative for dysphoric mood. The patient is not nervous/anxious.         Objective:   Physical Exam  Constitutional: He appears well-developed and well-nourished. No distress.  HENT:  Head: Normocephalic and atraumatic.  Mouth/Throat: Oropharynx is clear and moist.  Eyes: Conjunctivae normal and EOM are normal. Pupils are equal, round, and reactive to light. No scleral icterus.  Neck: Normal range of motion. Neck supple. No JVD present. Carotid bruit is not present. No thyromegaly present.  Cardiovascular: Normal rate, regular rhythm, normal heart sounds and intact distal pulses.  Exam reveals no gallop.   Pulmonary/Chest: Effort normal and breath sounds normal. No respiratory distress. He has no wheezes.  Abdominal: Soft. Bowel sounds are  normal. He exhibits no distension and no mass. There is no tenderness.  Musculoskeletal: He exhibits no edema and no tenderness.  Lymphadenopathy:    He has no cervical adenopathy.  Neurological: He is alert. He has normal reflexes.  Skin: Skin is warm and dry. No rash noted. No erythema. No pallor.  Psychiatric: He has a normal mood and affect.          Assessment & Plan:

## 2012-07-06 NOTE — Assessment & Plan Note (Signed)
LFTs stable Is eating less fat  Urged to also work on wt loss and exercise  Rev labs

## 2012-07-06 NOTE — Assessment & Plan Note (Signed)
Working on diet - and sounds much better  Will check lipid mid jan with INR Rev low sat fat diet

## 2012-07-06 NOTE — Assessment & Plan Note (Addendum)
Pt doing well on anticoagulation-no pain or other symptoms Lab Results  Component Value Date   ALT 50 05/25/2012   AST 28 05/25/2012   ALKPHOS 74 05/25/2012   BILITOT 0.67 05/25/2012   for fu at [facility] in Jan for further eval

## 2012-07-06 NOTE — Patient Instructions (Addendum)
Please schedule fasting labs and a protime/INR for mid January  Keep working on [employer] exercising -get on the bike (with a helmet)

## 2012-07-17 ENCOUNTER — Other Ambulatory Visit: Payer: BC Managed Care – PPO

## 2012-07-23 ENCOUNTER — Other Ambulatory Visit (INDEPENDENT_AMBULATORY_CARE_PROVIDER_SITE_OTHER): Payer: BC Managed Care – PPO

## 2012-07-23 ENCOUNTER — Ambulatory Visit (INDEPENDENT_AMBULATORY_CARE_PROVIDER_SITE_OTHER): Payer: BC Managed Care – PPO | Admitting: General Practice

## 2012-07-23 DIAGNOSIS — E785 Hyperlipidemia, unspecified: Secondary | ICD-10-CM

## 2012-07-23 DIAGNOSIS — I81 Portal vein thrombosis: Secondary | ICD-10-CM

## 2012-07-23 LAB — LDL CHOLESTEROL, DIRECT: Direct LDL: 188.8 mg/dL

## 2012-07-23 LAB — LIPID PANEL: Total CHOL/HDL Ratio: 7

## 2012-07-24 ENCOUNTER — Ambulatory Visit: Payer: BC Managed Care – PPO | Admitting: Family Medicine

## 2012-07-26 ENCOUNTER — Ambulatory Visit: Payer: BC Managed Care – PPO

## 2012-08-20 ENCOUNTER — Ambulatory Visit (INDEPENDENT_AMBULATORY_CARE_PROVIDER_SITE_OTHER): Payer: BC Managed Care – PPO | Admitting: General Practice

## 2012-08-20 DIAGNOSIS — I81 Portal vein thrombosis: Secondary | ICD-10-CM

## 2012-08-20 DIAGNOSIS — Z7901 Long term (current) use of anticoagulants: Secondary | ICD-10-CM

## 2012-08-20 DIAGNOSIS — Z5181 Encounter for therapeutic drug level monitoring: Secondary | ICD-10-CM

## 2012-08-20 LAB — POCT INR: INR: 2.1

## 2012-08-30 ENCOUNTER — Other Ambulatory Visit: Payer: BC Managed Care – PPO | Admitting: Lab

## 2012-08-31 ENCOUNTER — Ambulatory Visit: Payer: BC Managed Care – PPO | Admitting: Oncology

## 2012-08-31 ENCOUNTER — Other Ambulatory Visit: Payer: BC Managed Care – PPO | Admitting: Lab

## 2012-09-05 ENCOUNTER — Telehealth: Payer: Self-pay | Admitting: Oncology

## 2012-09-05 ENCOUNTER — Encounter: Payer: Self-pay | Admitting: Oncology

## 2012-09-05 NOTE — Telephone Encounter (Signed)
Pt returned call and r/s 3/11 appt to 3/17. Per pt he works out of town and know he will be home again 3/17. Per pt he can come 3/17 and then take his flight out. Pt does not know at this time when he will be home again. Comment added to 3/17 appt asking that it not be moved. Pt also had questions re a lb drawn in November 2013 that his ins will not pay for. Per pt he has called our office and the central billing office and s/w someone who told him that it would be taken care of but he has not heard anything and now Loney Loh is wanting to send him to collections. Per pt when he did this lab in September it was fine he does not know why the doctor repeated it in November. Pt made aware I will forward this to Darlena to see if she can help. Pt will call Darlena in a few days.

## 2012-09-05 NOTE — Telephone Encounter (Signed)
lmonvm for pt re reassignment and new appt/provider for 3/11. Mailed letter/schedule and asked that pt call back to confirm message.

## 2012-09-18 ENCOUNTER — Other Ambulatory Visit: Payer: BC Managed Care – PPO | Admitting: Lab

## 2012-09-18 ENCOUNTER — Ambulatory Visit: Payer: BC Managed Care – PPO | Admitting: Nurse Practitioner

## 2012-09-24 ENCOUNTER — Ambulatory Visit (INDEPENDENT_AMBULATORY_CARE_PROVIDER_SITE_OTHER): Payer: BC Managed Care – PPO | Admitting: General Practice

## 2012-09-24 ENCOUNTER — Other Ambulatory Visit: Payer: BC Managed Care – PPO

## 2012-09-24 ENCOUNTER — Ambulatory Visit: Payer: BC Managed Care – PPO | Admitting: Nurse Practitioner

## 2012-09-24 DIAGNOSIS — Z5181 Encounter for therapeutic drug level monitoring: Secondary | ICD-10-CM

## 2012-09-24 DIAGNOSIS — Z7901 Long term (current) use of anticoagulants: Secondary | ICD-10-CM

## 2012-09-24 DIAGNOSIS — I81 Portal vein thrombosis: Secondary | ICD-10-CM

## 2012-09-24 LAB — POCT INR: INR: 2.6

## 2012-10-01 ENCOUNTER — Ambulatory Visit: Payer: BC Managed Care – PPO

## 2012-10-19 ENCOUNTER — Ambulatory Visit: Payer: BC Managed Care – PPO

## 2012-10-19 ENCOUNTER — Ambulatory Visit (INDEPENDENT_AMBULATORY_CARE_PROVIDER_SITE_OTHER): Payer: BC Managed Care – PPO | Admitting: General Practice

## 2012-10-19 DIAGNOSIS — I81 Portal vein thrombosis: Secondary | ICD-10-CM

## 2012-10-19 DIAGNOSIS — Z5181 Encounter for therapeutic drug level monitoring: Secondary | ICD-10-CM

## 2012-10-19 DIAGNOSIS — Z7901 Long term (current) use of anticoagulants: Secondary | ICD-10-CM

## 2012-10-22 ENCOUNTER — Ambulatory Visit: Payer: BC Managed Care – PPO

## 2012-10-29 ENCOUNTER — Ambulatory Visit (INDEPENDENT_AMBULATORY_CARE_PROVIDER_SITE_OTHER): Payer: BC Managed Care – PPO | Admitting: General Practice

## 2012-10-29 DIAGNOSIS — I81 Portal vein thrombosis: Secondary | ICD-10-CM

## 2012-10-29 DIAGNOSIS — Z5181 Encounter for therapeutic drug level monitoring: Secondary | ICD-10-CM

## 2012-10-29 DIAGNOSIS — Z7901 Long term (current) use of anticoagulants: Secondary | ICD-10-CM

## 2012-11-05 ENCOUNTER — Ambulatory Visit: Payer: BC Managed Care – PPO

## 2012-11-26 ENCOUNTER — Ambulatory Visit (INDEPENDENT_AMBULATORY_CARE_PROVIDER_SITE_OTHER): Payer: BC Managed Care – PPO | Admitting: General Practice

## 2012-11-26 DIAGNOSIS — Z5181 Encounter for therapeutic drug level monitoring: Secondary | ICD-10-CM

## 2012-11-26 DIAGNOSIS — Z7901 Long term (current) use of anticoagulants: Secondary | ICD-10-CM

## 2012-11-26 DIAGNOSIS — I81 Portal vein thrombosis: Secondary | ICD-10-CM

## 2012-12-06 ENCOUNTER — Ambulatory Visit (INDEPENDENT_AMBULATORY_CARE_PROVIDER_SITE_OTHER): Payer: BC Managed Care – PPO | Admitting: Family Medicine

## 2012-12-06 ENCOUNTER — Encounter: Payer: Self-pay | Admitting: Family Medicine

## 2012-12-06 VITALS — BP 122/84 | HR 76 | Temp 97.9°F | Wt 237.8 lb

## 2012-12-06 DIAGNOSIS — H539 Unspecified visual disturbance: Secondary | ICD-10-CM | POA: Insufficient documentation

## 2012-12-06 DIAGNOSIS — H538 Other visual disturbances: Secondary | ICD-10-CM

## 2012-12-06 NOTE — Progress Notes (Signed)
  Subjective:    Patient ID: [name] [name], male    DOB: 1971-06-20, 42 y.o.   MRN: [id number]  HPI CC: L eye blurry  Woke up this morning with L eye blurry vision.  Getting better over last few hours, back to normal now.    Some nasal congestion but no recent red eye, watery or itchy eyes. Denies pain or redness in L eye.  No floaters, photopsia.  No headaches.    Has recently been mowing lawn, but never felt irritation in eye.  H/o L eye lasix several year ago by Dr. Ross Marcus (2011).  Does not wear glasses or contacts.  On coumadin for h/o portal vein thrombosis Snellen normal today - R 10, L 20, B 10  Blurry vision, not complete vision loss.  Lab Results  Component Value Date   INR 2.2 11/26/2012   INR 2.2 10/29/2012   INR 1.4 10/19/2012   PROTIME 16.8* 05/25/2012   PROTIME 38.4* 03/21/2012    Past Medical History  Diagnosis Date  . Kidney stones   . Portal vein thrombosis   . Heartburn   . Hepatic steatosis     asymptomatic    Past Surgical History  Procedure Laterality Date  . Lasik      left eye  . Anterior cruciate ligament repair  12/2005    left      Review of Systems Per HPI    Objective:   Physical Exam  Nursing note and vitals reviewed. Constitutional: He is oriented to person, place, and time. He appears well-developed and well-nourished. No distress.  HENT:  Head: Normocephalic and atraumatic.  Mouth/Throat: Oropharynx is clear and moist. No oropharyngeal exudate.  Eyes: Conjunctivae, EOM and lids are normal. Pupils are equal, round, and reactive to light. Right eye exhibits no chemosis, no discharge, no exudate and no hordeolum. No foreign body present in the right eye. Left eye exhibits no chemosis, no discharge, no exudate and no hordeolum. No foreign body present in the left eye. Right conjunctiva is not injected. Left conjunctiva is not injected. No scleral icterus.  Fundoscopic exam:      The right eye shows no papilledema. The right eye  shows red reflex.       The left eye shows no papilledema. The left eye shows red reflex.  Slit lamp exam:      The right eye shows no corneal abrasion and no anterior chamber bulge.       The left eye shows no corneal abrasion and no anterior chamber bulge.  No papilledema appreciated EOMI without pain No corneal haziness or clouding of lens  Neurological: He is alert and oriented to person, place, and time. No cranial nerve deficit. He displays a negative Romberg sign. Coordination and gait normal.  CN 2-12 intact No pronator drift       Assessment & Plan:

## 2012-12-06 NOTE — Patient Instructions (Signed)
Exam looking normal today. Let's check sugar. If this happens again, see eye doctor.  If trouble getting in, let us know. Watch for any worsening headaches, red eye, floaters, worsening blurry vision or double vision - if this happens, let us know.

## 2012-12-06 NOTE — Assessment & Plan Note (Addendum)
Transient left vision change of unclear etiology As has resolved and exam WNL, will monitor for now. If recurrent, advised to see ophthalmologist (in h/o left eye lasix). If any double vision, will evaluate sooner. Coumadin therapeutic for h/o portal vein thrombosis. - doubt eye vessel occlusion cbg wnl today

## 2012-12-19 ENCOUNTER — Telehealth: Payer: Self-pay | Admitting: *Deleted

## 2012-12-19 NOTE — Telephone Encounter (Signed)
Patient notified. Thanks.

## 2012-12-19 NOTE — Telephone Encounter (Signed)
Patient called and is working out of town. He has discovered bedbugs in the bed at the hotel where he is staying. He has red, itchy bites on him as well as his co-workers staying in other rooms at the same hotel. Could you send in some pyrethrin or something to help him to [facility] on [address] in [place], [place]? Or should he just use something OTC?

## 2012-12-19 NOTE — Telephone Encounter (Signed)
permetherin does not help bed bug bites (we use it for lice/scabies type things instead)  For bed bug bites - antihistamines like zyrtec or benadryl will help the itch until they heal Most importantly - he needs to get out of the hotel or he will continue to be bitten since it is infested

## 2012-12-19 NOTE — Telephone Encounter (Signed)
Actually- send this message to [employer]

## 2012-12-19 NOTE — Telephone Encounter (Signed)
Permethrin not pyrethrin

## 2012-12-24 ENCOUNTER — Ambulatory Visit: Payer: BC Managed Care – PPO

## 2013-02-26 ENCOUNTER — Telehealth: Payer: Self-pay | Admitting: *Deleted

## 2013-02-26 DIAGNOSIS — I81 Portal vein thrombosis: Secondary | ICD-10-CM

## 2013-02-26 NOTE — Telephone Encounter (Signed)
Will do referral to GI [place]- and route to [facility]

## 2013-02-26 NOTE — Telephone Encounter (Signed)
Patient requests referral for endoscopy to rule out varices per Dr. Isaiah Serge at [facility]. He was supposed to have this awhile ago, but forgot and has a follow up with Dr. Isaiah Serge in September and will need the results for that appt. He would like to go to [facility] for the procedure since he is established there and it's local. This will help determine if he needs to stay on the coumadin or not. Thanks!

## 2013-02-28 ENCOUNTER — Telehealth: Payer: Self-pay | Admitting: Gastroenterology

## 2013-02-28 NOTE — Telephone Encounter (Signed)
Can have EGD at [facility] with moderate sedation to check for varices.  Has previous portal vein clot.   thanks

## 2013-02-28 NOTE — Telephone Encounter (Signed)
Dr Christella Hartigan do you want to schedule this person directly for EGD or get them in to see an extender, you dont have any openings until October?

## 2013-03-01 ENCOUNTER — Telehealth: Payer: Self-pay

## 2013-03-01 NOTE — Telephone Encounter (Signed)
On Coumadin therapy.

## 2013-03-01 NOTE — Telephone Encounter (Signed)
yes

## 2013-03-01 NOTE — Telephone Encounter (Signed)
Pt has been scheduled  for previsit and EGD pt and referring have been notified

## 2013-03-05 ENCOUNTER — Encounter: Payer: Self-pay | Admitting: Gastroenterology

## 2013-03-05 ENCOUNTER — Ambulatory Visit (AMBULATORY_SURGERY_CENTER): Payer: BC Managed Care – PPO

## 2013-03-05 VITALS — Ht 72.0 in | Wt 234.8 lb

## 2013-03-05 DIAGNOSIS — I81 Portal vein thrombosis: Secondary | ICD-10-CM

## 2013-03-12 ENCOUNTER — Ambulatory Visit (AMBULATORY_SURGERY_CENTER): Payer: BC Managed Care – PPO | Admitting: Gastroenterology

## 2013-03-12 ENCOUNTER — Encounter: Payer: Self-pay | Admitting: Gastroenterology

## 2013-03-12 VITALS — BP 123/71 | HR 78 | Temp 97.2°F | Resp 12 | Ht 72.0 in | Wt 234.0 lb

## 2013-03-12 DIAGNOSIS — I81 Portal vein thrombosis: Secondary | ICD-10-CM

## 2013-03-12 MED ORDER — SODIUM CHLORIDE 0.9 % IV SOLN: 500.0000 mL | INTRAVENOUS | Status: DC

## 2013-03-12 NOTE — Patient Instructions (Addendum)
Discharge instructions given with verbal understanding. Normal exam. Resume previous medications. YOU HAD AN ENDOSCOPIC PROCEDURE TODAY AT THE [facility]: Refer to the procedure report that was given to you for any specific questions about what was found during the examination.  If the procedure report does not answer your questions, please call your gastroenterologist to clarify.  If you requested that your care partner not be given the details of your procedure findings, then the procedure report has been included in a sealed envelope for you to review at your convenience later.  YOU SHOULD EXPECT: Some feelings of bloating in the abdomen. Passage of more gas than usual.  Walking can help get rid of the air that was put into your GI tract during the procedure and reduce the bloating. If you had a lower endoscopy (such as a colonoscopy or flexible sigmoidoscopy) you may notice spotting of blood in your stool or on the toilet paper. If you underwent a bowel prep for your procedure, then you may not have a normal bowel movement for a few days.  DIET: Your first meal following the procedure should be a light meal and then it is ok to progress to your normal diet.  A half-sandwich or bowl of soup is an example of a good first meal.  Heavy or fried foods are harder to digest and may make you feel nauseous or bloated.  Likewise meals heavy in dairy and vegetables can cause extra gas to form and this can also increase the bloating.  Drink plenty of fluids but you should avoid alcoholic beverages for 24 hours.  ACTIVITY: Your care partner should take you home directly after the procedure.  You should plan to take it easy, moving slowly for the rest of the day.  You can resume normal activity the day after the procedure however you should NOT DRIVE or use heavy machinery for 24 hours (because of the sedation medicines used during the test).    SYMPTOMS TO REPORT IMMEDIATELY: A gastroenterologist  can be reached at any hour.  During normal business hours, 8:30 AM to 5:00 PM Monday through Friday, call [phone].  After hours and on weekends, please call the GI answering service at [phone] who will take a message and have the physician on call contact you.   Following upper endoscopy (EGD)  Vomiting of blood or coffee ground material  New chest pain or pain under the shoulder blades  Painful or persistently difficult swallowing  New shortness of breath  Fever of 100F or higher  Black, tarry-looking stools  FOLLOW UP: If any biopsies were taken you will be contacted by phone or by letter within the next 1-3 weeks.  Call your gastroenterologist if you have not heard about the biopsies in 3 weeks.  Our staff will call the home number listed on your records the next business day following your procedure to check on you and address any questions or concerns that you may have at that time regarding the information given to you following your procedure. This is a courtesy call and so if there is no answer at the home number and we have not heard from you through the emergency physician on call, we will assume that you have returned to your regular daily activities without incident.  SIGNATURES/CONFIDENTIALITY: You and/or your care partner have signed paperwork which will be entered into your electronic medical record.  These signatures attest to the fact that that the information above on your After   Visit Summary has been reviewed and is understood.  Full responsibility of the confidentiality of this discharge information lies with you and/or your care-partner. 

## 2013-03-12 NOTE — Progress Notes (Signed)
Patient did not experience any of the following events: a burn prior to discharge; a fall within the facility; wrong site/side/patient/procedure/implant event; or a hospital transfer or hospital admission upon discharge from the facility. (G8907) Patient did not have preoperative order for IV antibiotic SSI prophylaxis. (G8918)  

## 2013-03-12 NOTE — Op Note (Signed)
[facility] [address] [place], [address]   ENDOSCOPY PROCEDURE REPORT  PATIENT: [name] [name].  MR#: [id number] BIRTHDATE: 09/05/1970 , 42  yrs. old GENDER: Male ENDOSCOPIST: Rachael Fee, MD PROCEDURE DATE:  03/12/2013 PROCEDURE:  EGD, diagnostic ASA CLASS:     Class II INDICATIONS:  acute portal vein thrombosis 2013, not associated with malignancy; check for varices now (requested by his [facility] hematologist). MEDICATIONS: Fentanyl 75 mcg IV, Versed 8 mg IV, and These medications were titrated to patient response per physician's verbal order TOPICAL ANESTHETIC: Cetacaine Spray  DESCRIPTION OF PROCEDURE: After the risks benefits and alternatives of the procedure were thoroughly explained, informed consent was obtained.  The LB [id number] endoscope was introduced through the mouth and advanced to the second portion of the duodenum. Without limitations.  The instrument was slowly withdrawn as the mucosa was fully examined.     The upper, middle and distal third of the esophagus were carefully inspected and no abnormalities were noted.  The z-line was well seen at the GEJ.  The endoscope was pushed into the fundus which was normal including a retroflexed view.  The antrum, gastric body, first and second part of the duodenum were unremarkable. Retroflexed views revealed no abnormalities.     The scope was then withdrawn from the patient and the procedure completed. COMPLICATIONS: There were no complications.  ENDOSCOPIC IMPRESSION: Normal EGD; no signs of portal hypertension.  RECOMMENDATIONS: Continue with plans to meet with Dr.  Isaiah Serge at [facility] later this month.   eSigned:  Rachael Fee, MD 03/12/2013 11:37 AM

## 2013-03-13 ENCOUNTER — Telehealth: Payer: Self-pay | Admitting: *Deleted

## 2013-03-13 NOTE — Telephone Encounter (Signed)
  Follow up Call-  Call back number 03/12/2013  Post procedure Call Back phone  # [phone]  Permission to leave phone message Yes     Patient questions:  Do you have a fever, pain , or abdominal swelling? no Pain Score  0 *  Have you tolerated food without any problems? yes  Have you been able to return to your normal activities? yes  Do you have any questions about your discharge instructions: Diet   no Medications  no Follow up visit  no  Do you have questions or concerns about your Care? no  Actions: * If pain score is 4 or above: No action needed, pain <4. Pt c/o slight sore throat, but states it just feels "irritated."  No difficulty eating or swallowing

## 2013-03-27 ENCOUNTER — Ambulatory Visit (INDEPENDENT_AMBULATORY_CARE_PROVIDER_SITE_OTHER): Payer: BC Managed Care – PPO | Admitting: Family Medicine

## 2013-03-27 ENCOUNTER — Ambulatory Visit (INDEPENDENT_AMBULATORY_CARE_PROVIDER_SITE_OTHER)
Admission: RE | Admit: 2013-03-27 | Discharge: 2013-03-27 | Disposition: A | Discharge status: Home / Self Care | Payer: BC Managed Care – PPO | Source: Ambulatory Visit | Attending: Family Medicine | Admitting: Family Medicine

## 2013-03-27 ENCOUNTER — Telehealth: Payer: Self-pay | Admitting: *Deleted

## 2013-03-27 ENCOUNTER — Encounter: Payer: Self-pay | Admitting: Family Medicine

## 2013-03-27 VITALS — BP 118/80 | HR 89 | Temp 97.8°F | Ht 72.75 in | Wt 239.2 lb

## 2013-03-27 DIAGNOSIS — M5442 Lumbago with sciatica, left side: Secondary | ICD-10-CM

## 2013-03-27 DIAGNOSIS — M5430 Sciatica, unspecified side: Secondary | ICD-10-CM

## 2013-03-27 DIAGNOSIS — M549 Dorsalgia, unspecified: Secondary | ICD-10-CM

## 2013-03-27 DIAGNOSIS — Z23 Encounter for immunization: Secondary | ICD-10-CM

## 2013-03-27 HISTORY — DX: Lumbago with sciatica, left side: M54.42

## 2013-03-27 LAB — POCT URINALYSIS DIPSTICK
Ketones, UA: NEGATIVE
Leukocytes, UA: NEGATIVE
Nitrite, UA: NEGATIVE
Protein, UA: NEGATIVE
pH, UA: 6

## 2013-03-27 MED ORDER — CYCLOBENZAPRINE HCL 10 MG PO TABS: ORAL_TABLET | ORAL | Status: DC

## 2013-03-27 NOTE — Patient Instructions (Addendum)
I think you have a low back spasm causing an inflamed sciatic nerve  Walk as much as you can Use heat on area for 10 minutes at a time  Massage also  Xray today -we will call you with a result Try the flexeril - it may sedate you so use caution

## 2013-03-27 NOTE — Telephone Encounter (Addendum)
Patient called requesting note to be out of work due to meds and safety concerns. His boss advised he cannot work/drive on the flexeril and would need to be out of work while taking it, so it will turn into FMLA once he goes back, but will he will need a note temporarily. (Maybe through the week or whatever you think he needs)  Thanks!

## 2013-03-27 NOTE — Progress Notes (Signed)
Subjective:    Patient ID: [name] [name], male    DOB: 19-Nov-1970, 42 y.o.   MRN: [id number]  HPI Here for back pain  Started hurting yesterday at work around lunch  Was moving a ladder- nothing more than usual  Got worse as the day went on   Certain movements make it worse- like climbing into the truck  Got home - and massaged it  Very stiff after riding in the car  Hurt at night and could not get comfortable  No comfortable pos  Pain is in low L back  Now radiates into leg / hip -- goes just past the knee   Never had back problems before   No numbness / just pain  No weakness No loss of bowel or bladder control   Patient Active Problem List   Diagnosis Date Noted  . Change in vision 12/06/2012  . Hyperlipidemia 04/23/2012  . Routine general medical examination at a health care facility 04/16/2012  . Fatigue 04/16/2012  . Fatty liver 03/18/2012  . Portal vein thrombosis 03/13/2012  . Ureterolithiasis 03/09/2012   Past Medical History  Diagnosis Date  . Kidney stones   . Portal vein thrombosis   . Heartburn   . Hepatic steatosis     asymptomatic   Past Surgical History  Procedure Laterality Date  . Lasik      left eye  . Anterior cruciate ligament repair  12/2005    left    History  Substance Use Topics  . Smoking status: Never Smoker   . Smokeless tobacco: Never Used  . Alcohol Use: Yes     Comment: "1 to 3 per night and a couple weeks with nothing at all"   Family History  Problem Relation Age of Onset  . Diabetes Maternal Grandmother   . Stroke Maternal Grandmother   . Colon cancer Neg Hx   . Gallbladder disease Father   . Hyperlipidemia Mother   . Deep vein thrombosis Maternal Aunt    No Known Allergies Current Outpatient Prescriptions on File Prior to Visit  Medication Sig Dispense Refill  . warfarin (COUMADIN) 5 MG tablet Take by mouth as directed  Alt 5mg  and 7.5mg        No current facility-administered medications on file prior to visit.      Review of Systems Review of Systems  Constitutional: Negative for fever, appetite change, fatigue and unexpected weight change.  Eyes: Negative for pain and visual disturbance.  Respiratory: Negative for cough and shortness of breath.   Cardiovascular: Negative for cp or palpitations    Gastrointestinal: Negative for nausea, diarrhea and constipation. neg for abd or pelvic pain  Genitourinary: Negative for urgency and frequency. neg for loss of bladder or bowel control Skin: Negative for pallor or rash   MSK pos for L lower back pain , neg for swollen joints Neurological: Negative for weakness, light-headedness, numbness and headaches.  Hematological: Negative for adenopathy. Does not bruise/bleed easily.  Psychiatric/Behavioral: Negative for dysphoric mood. The patient is not nervous/anxious.         Objective:   Physical Exam  Constitutional: He appears well-developed and well-nourished. No distress.  HENT:  Head: Normocephalic and atraumatic.  Eyes: Conjunctivae and EOM are normal. Pupils are equal, round, and reactive to light.  Neck: Normal range of motion. Neck supple.  Cardiovascular: Normal rate and regular rhythm.   Abdominal: Soft. Bowel sounds are normal. He exhibits no distension. There is no tenderness.  Musculoskeletal:  Lumbar back: He exhibits decreased range of motion, tenderness, pain and spasm. He exhibits no bony tenderness, no edema and no deformity.  Tender over L lumbar musculature  Nl rom hip Pos SLR for leg pain  No neuro deficits Flex 90 deg Pain on extension  Neurological: He is alert. He has normal strength and normal reflexes. He displays no atrophy. No sensory deficit. Gait normal.  Skin: Skin is warm and dry. No rash noted.  Psychiatric: He has a normal mood and affect.          Assessment & Plan:

## 2013-03-28 LAB — POCT UA - MICROSCOPIC ONLY
Casts, Ur, LPF, POC: 0
Yeast, UA: 0

## 2013-03-28 NOTE — Telephone Encounter (Signed)
I will do it when back in the office tomorrow - please send this back to me  Let me know how he is feeling please

## 2013-03-28 NOTE — Assessment & Plan Note (Addendum)
Suspect due to muscle spasm  No neuro s/s Xray today-pend Cannot take nsaids due to coumadin Trial of flexeril when not working or driving Heat/ stretch/walk Update if not starting to improve in a week or if worsening   Of note ua shows blood on dip with neg micro- suspect from coumadin

## 2013-03-29 NOTE — Telephone Encounter (Signed)
I will do it through the end of the week

## 2013-03-29 NOTE — Telephone Encounter (Signed)
Letter given to [employer]

## 2013-04-03 ENCOUNTER — Telehealth: Payer: Self-pay | Admitting: *Deleted

## 2013-04-03 NOTE — Telephone Encounter (Signed)
FMLA form in your IN box for completion for recent back issue. Thanks!

## 2013-05-04 ENCOUNTER — Emergency Department (HOSPITAL_BASED_OUTPATIENT_CLINIC_OR_DEPARTMENT_OTHER): Payer: BC Managed Care – PPO

## 2013-05-04 ENCOUNTER — Emergency Department (HOSPITAL_BASED_OUTPATIENT_CLINIC_OR_DEPARTMENT_OTHER)
Admission: EM | Admit: 2013-05-04 | Discharge: 2013-05-05 | Disposition: A | Discharge status: Home / Self Care | Payer: BC Managed Care – PPO | Attending: Emergency Medicine | Admitting: Emergency Medicine

## 2013-05-04 DIAGNOSIS — S298XXA Other specified injuries of thorax, initial encounter: Secondary | ICD-10-CM | POA: Insufficient documentation

## 2013-05-04 DIAGNOSIS — S60219A Contusion of unspecified wrist, initial encounter: Secondary | ICD-10-CM | POA: Insufficient documentation

## 2013-05-04 DIAGNOSIS — Y9389 Activity, other specified: Secondary | ICD-10-CM | POA: Insufficient documentation

## 2013-05-04 DIAGNOSIS — Z87442 Personal history of urinary calculi: Secondary | ICD-10-CM | POA: Insufficient documentation

## 2013-05-04 DIAGNOSIS — S3981XA Other specified injuries of abdomen, initial encounter: Secondary | ICD-10-CM | POA: Insufficient documentation

## 2013-05-04 DIAGNOSIS — Y92410 Unspecified street and highway as the place of occurrence of the external cause: Secondary | ICD-10-CM | POA: Insufficient documentation

## 2013-05-04 DIAGNOSIS — Z8719 Personal history of other diseases of the digestive system: Secondary | ICD-10-CM | POA: Insufficient documentation

## 2013-05-04 DIAGNOSIS — Z7901 Long term (current) use of anticoagulants: Secondary | ICD-10-CM | POA: Insufficient documentation

## 2013-05-04 DIAGNOSIS — Z86718 Personal history of other venous thrombosis and embolism: Secondary | ICD-10-CM | POA: Insufficient documentation

## 2013-05-04 DIAGNOSIS — S60212A Contusion of left wrist, initial encounter: Secondary | ICD-10-CM

## 2013-05-04 LAB — CBC WITH DIFFERENTIAL/PLATELET
Basophils Absolute: 0 10*3/uL (ref 0.0–0.1)
HCT: 43.5 % (ref 39.0–52.0)
Lymphocytes Relative: 25 % (ref 12–46)
Neutro Abs: 8.2 10*3/uL — ABNORMAL HIGH (ref 1.7–7.7)
Platelets: 241 10*3/uL (ref 150–400)
RBC: 4.57 MIL/uL (ref 4.22–5.81)
RDW: 12.1 % (ref 11.5–15.5)
WBC: 12.6 10*3/uL — ABNORMAL HIGH (ref 4.0–10.5)

## 2013-05-04 LAB — PROTIME-INR
INR: 0.97 (ref 0.00–1.49)
Prothrombin Time: 12.7 seconds (ref 11.6–15.2)
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
## 2013-05-05 ENCOUNTER — Emergency Department (HOSPITAL_BASED_OUTPATIENT_CLINIC_OR_DEPARTMENT_OTHER): Payer: BC Managed Care – PPO

## 2013-05-05 LAB — BASIC METABOLIC PANEL
CO2: 24 mEq/L (ref 19–32)
Chloride: 102 mEq/L (ref 96–112)
Creatinine, Ser: 1.1 mg/dL (ref 0.50–1.35)
GFR calc Af Amer: >90 mL/min (ref >90)
GFR calc non Af Amer: 82 mL/min — ABNORMAL LOW (ref >90)
Sodium: 140 mEq/L (ref 135–145)

## 2013-05-05 MED ORDER — IOHEXOL 300 MG/ML  SOLN
100.0000 mL | Freq: Once | INTRAMUSCULAR | Status: AC | PRN
  Administered 2013-05-05: 100 mL via INTRAVENOUS

## 2013-05-05 MED ORDER — OXYCODONE-ACETAMINOPHEN 5-325 MG PO TABS
1.0000 | ORAL_TABLET | Freq: Once | ORAL | Status: AC
  Administered 2013-05-05: 1 via ORAL
  Filled 2013-05-05: qty 1

## 2013-05-05 MED ORDER — HYDROCODONE-ACETAMINOPHEN 5-325 MG PO TABS: 1.0000 | ORAL_TABLET | Freq: Four times a day (QID) | ORAL | Status: DC | PRN

## 2013-05-06 ENCOUNTER — Ambulatory Visit (INDEPENDENT_AMBULATORY_CARE_PROVIDER_SITE_OTHER): Payer: BC Managed Care – PPO | Admitting: Family Medicine

## 2013-05-06 ENCOUNTER — Encounter: Payer: Self-pay | Admitting: Family Medicine

## 2013-05-06 VITALS — BP 116/72 | HR 98 | Temp 97.9°F | Ht 72.75 in | Wt 238.0 lb

## 2013-05-06 DIAGNOSIS — Z7901 Long term (current) use of anticoagulants: Secondary | ICD-10-CM

## 2013-05-06 DIAGNOSIS — M79642 Pain in left hand: Secondary | ICD-10-CM | POA: Insufficient documentation

## 2013-05-06 DIAGNOSIS — Z5181 Encounter for therapeutic drug level monitoring: Secondary | ICD-10-CM

## 2013-05-06 DIAGNOSIS — I81 Portal vein thrombosis: Secondary | ICD-10-CM

## 2013-05-06 DIAGNOSIS — M79609 Pain in unspecified limb: Secondary | ICD-10-CM

## 2013-05-06 NOTE — Assessment & Plan Note (Signed)
Rev hosp notes and studies from ER in detail  Neg CT scans thankfully  Xray of hand /wrist neg but scaphoid fx still poss due to pain and swelling in snuffbox area  Will update if ha or other symptoms

## 2013-05-06 NOTE — Assessment & Plan Note (Signed)
On coumadin Low INR in ER- pt states there were missed doses INR and coumadin clinic today
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
## 2013-05-06 NOTE — Patient Instructions (Signed)
Keep splint on all the time when not showering - adjust it how you need it  Stay out of work until follow up with Dr Patsy Lager Schedule follow up with Dr Patsy Lager in about 10 days Ice for swelling  Pain medicine as needed

## 2013-05-06 NOTE — Assessment & Plan Note (Signed)
Pain and swelling in snuffbox area despite neg xr- concerning for scaphoid injury Spica thumb splint given/ rel rest/ cold compress and norco prn  Off work until re eval in 10 d Imaging and f/u with Dr Patsy Lager in 10 d planned

## 2013-05-15 ENCOUNTER — Ambulatory Visit (INDEPENDENT_AMBULATORY_CARE_PROVIDER_SITE_OTHER)
Admission: RE | Admit: 2013-05-15 | Discharge: 2013-05-15 | Disposition: A | Discharge status: Home / Self Care | Payer: BC Managed Care – PPO | Source: Ambulatory Visit | Attending: Family Medicine | Admitting: Family Medicine

## 2013-05-15 ENCOUNTER — Encounter: Payer: Self-pay | Admitting: Family Medicine

## 2013-05-15 ENCOUNTER — Ambulatory Visit (INDEPENDENT_AMBULATORY_CARE_PROVIDER_SITE_OTHER): Payer: BC Managed Care – PPO | Admitting: Family Medicine

## 2013-05-15 VITALS — BP 118/72 | HR 96 | Temp 98.1°F | Wt 245.3 lb

## 2013-05-15 DIAGNOSIS — M79642 Pain in left hand: Secondary | ICD-10-CM

## 2013-05-15 DIAGNOSIS — M25532 Pain in left wrist: Secondary | ICD-10-CM

## 2013-05-15 DIAGNOSIS — M79609 Pain in unspecified limb: Secondary | ICD-10-CM

## 2013-05-15 DIAGNOSIS — M25539 Pain in unspecified wrist: Secondary | ICD-10-CM

## 2013-05-15 NOTE — Progress Notes (Signed)
Pre-visit discussion using our clinic review tool. No additional management support is needed unless otherwise documented below in the visit note.  
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
## 2013-05-26 ENCOUNTER — Other Ambulatory Visit: Payer: Self-pay | Admitting: Family Medicine

## 2013-05-27 ENCOUNTER — Ambulatory Visit (INDEPENDENT_AMBULATORY_CARE_PROVIDER_SITE_OTHER): Payer: BC Managed Care – PPO | Admitting: Family Medicine

## 2013-05-27 ENCOUNTER — Encounter: Payer: Self-pay | Admitting: Family Medicine

## 2013-05-27 VITALS — BP 106/74 | HR 92 | Temp 98.2°F | Ht 72.75 in | Wt 244.2 lb

## 2013-05-27 DIAGNOSIS — M79609 Pain in unspecified limb: Secondary | ICD-10-CM

## 2013-05-27 DIAGNOSIS — M79642 Pain in left hand: Secondary | ICD-10-CM

## 2013-05-27 DIAGNOSIS — M25539 Pain in unspecified wrist: Secondary | ICD-10-CM

## 2013-05-27 DIAGNOSIS — M25532 Pain in left wrist: Secondary | ICD-10-CM

## 2013-05-27 NOTE — Progress Notes (Signed)
Pre-visit discussion using our clinic review tool. No additional management support is needed unless otherwise documented below in the visit note.  
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
## 2013-05-27 NOTE — Telephone Encounter (Signed)
Rout to [facility] 

## 2013-05-27 NOTE — Patient Instructions (Signed)
REFERRAL: GO THE THE FRONT ROOM AT THE ENTRANCE OF OUR CLINIC, NEAR CHECK IN. ASK FOR MARION. SHE WILL HELP YOU SET UP YOUR REFERRAL. DATE: TIME:  

## 2013-05-28 ENCOUNTER — Telehealth: Payer: Self-pay | Admitting: Family Medicine

## 2013-05-28 NOTE — Telephone Encounter (Signed)
Patient is scheduled for MR Wrist on 06/07/13 at 2:45 pm at [facility]. The patient needs a PT INR done at our office the morning of this test, please place the order now. Patient needs to be off coumadin from Monday,Nov24th thru Friday,11/28. Please have your CMA advise your patient about this medication and when to restart his coumadin.

## 2013-05-28 NOTE — Telephone Encounter (Signed)
i will call radiology myself.

## 2013-05-28 NOTE — Telephone Encounter (Signed)
I am not going to have the patient stop his anti-coagulation. I just spoke with Dr. Micheline Rough from radiology, and he confirmed with the radiologist scheduled to perform his arthrogram, and they agree with me that in this case, it is ok for him to remain on coumadin.

## 2013-05-28 NOTE — Telephone Encounter (Signed)
I have been looking through chart more, and while MR arthrogram is the preferential test, I also want to discuss this with his PCP before I do anything if he has to go off of Coumadin. We can discuss tomorrow.

## 2013-05-29 NOTE — Telephone Encounter (Signed)
He just needs to show up. No change in meds. All confirmed with radiology as below.

## 2013-06-03 ENCOUNTER — Telehealth: Payer: Self-pay | Admitting: *Deleted

## 2013-06-03 NOTE — Telephone Encounter (Signed)
[place] dropped of FMLA forms.  Placed in Dr. Cyndie Chime in box to complete.

## 2013-06-05 ENCOUNTER — Ambulatory Visit (INDEPENDENT_AMBULATORY_CARE_PROVIDER_SITE_OTHER): Payer: BC Managed Care – PPO | Admitting: Family Medicine

## 2013-06-05 DIAGNOSIS — I81 Portal vein thrombosis: Secondary | ICD-10-CM

## 2013-06-05 LAB — POCT INR: INR: 1.1

## 2013-06-07 ENCOUNTER — Ambulatory Visit
Admission: RE | Admit: 2013-06-07 | Discharge: 2013-06-07 | Disposition: A | Discharge status: Home / Self Care | Payer: BC Managed Care – PPO | Source: Ambulatory Visit | Attending: Family Medicine | Admitting: Family Medicine

## 2013-06-07 ENCOUNTER — Other Ambulatory Visit (INDEPENDENT_AMBULATORY_CARE_PROVIDER_SITE_OTHER): Payer: BC Managed Care – PPO

## 2013-06-07 DIAGNOSIS — I81 Portal vein thrombosis: Secondary | ICD-10-CM

## 2013-06-07 DIAGNOSIS — M25532 Pain in left wrist: Secondary | ICD-10-CM

## 2013-06-07 DIAGNOSIS — M79642 Pain in left hand: Secondary | ICD-10-CM

## 2013-06-07 LAB — PROTIME-INR: Prothrombin Time: 12.1 s (ref 10.2–12.4)

## 2013-06-07 LAB — APTT: aPTT: 28.1 s (ref 21.7–28.8)

## 2013-06-07 MED ORDER — IOHEXOL 180 MG/ML  SOLN
3.0000 mL | Freq: Once | INTRAMUSCULAR | Status: AC | PRN
  Administered 2013-06-07: 3 mL via INTRA_ARTICULAR

## 2013-06-10 ENCOUNTER — Other Ambulatory Visit: Payer: Self-pay | Admitting: Family Medicine

## 2013-06-10 DIAGNOSIS — M25532 Pain in left wrist: Secondary | ICD-10-CM

## 2013-06-10 DIAGNOSIS — M79642 Pain in left hand: Secondary | ICD-10-CM

## 2013-06-10 DIAGNOSIS — S62301A Unspecified fracture of second metacarpal bone, left hand, initial encounter for closed fracture: Secondary | ICD-10-CM

## 2013-06-10 DIAGNOSIS — M25332 Other instability, left wrist: Secondary | ICD-10-CM

## 2013-06-11 NOTE — Telephone Encounter (Signed)
All completed in detail yesterday

## 2013-07-01 ENCOUNTER — Ambulatory Visit (INDEPENDENT_AMBULATORY_CARE_PROVIDER_SITE_OTHER): Payer: BC Managed Care – PPO | Admitting: Family Medicine

## 2013-07-01 DIAGNOSIS — Z5181 Encounter for therapeutic drug level monitoring: Secondary | ICD-10-CM

## 2013-07-01 DIAGNOSIS — I81 Portal vein thrombosis: Secondary | ICD-10-CM

## 2013-07-01 DIAGNOSIS — Z7901 Long term (current) use of anticoagulants: Secondary | ICD-10-CM

## 2013-07-01 LAB — POCT INR: INR: 2.6

## 2013-07-22 ENCOUNTER — Other Ambulatory Visit: Payer: Self-pay | Admitting: Family Medicine

## 2013-07-22 NOTE — Telephone Encounter (Signed)
Rout to [facility] 

## 2013-07-29 ENCOUNTER — Ambulatory Visit (INDEPENDENT_AMBULATORY_CARE_PROVIDER_SITE_OTHER): Payer: BC Managed Care – PPO | Admitting: Family Medicine

## 2013-07-29 ENCOUNTER — Other Ambulatory Visit: Payer: Self-pay | Admitting: Family Medicine

## 2013-07-29 DIAGNOSIS — I81 Portal vein thrombosis: Secondary | ICD-10-CM

## 2013-07-29 DIAGNOSIS — Z5181 Encounter for therapeutic drug level monitoring: Secondary | ICD-10-CM

## 2013-07-29 DIAGNOSIS — Z7901 Long term (current) use of anticoagulants: Secondary | ICD-10-CM

## 2013-07-29 LAB — POCT INR: INR: 2.1

## 2013-07-29 MED ORDER — WARFARIN SODIUM 6 MG PO TABS: 6.0000 mg | ORAL_TABLET | Freq: Every day | ORAL | Status: DC

## 2013-08-26 ENCOUNTER — Ambulatory Visit (INDEPENDENT_AMBULATORY_CARE_PROVIDER_SITE_OTHER): Payer: BC Managed Care – PPO | Admitting: Family Medicine

## 2013-08-26 DIAGNOSIS — I81 Portal vein thrombosis: Secondary | ICD-10-CM

## 2013-08-26 DIAGNOSIS — Z7901 Long term (current) use of anticoagulants: Secondary | ICD-10-CM

## 2013-08-26 DIAGNOSIS — Z5181 Encounter for therapeutic drug level monitoring: Secondary | ICD-10-CM

## 2013-08-26 LAB — POCT INR: INR: 1.3

## 2013-09-26 ENCOUNTER — Ambulatory Visit (INDEPENDENT_AMBULATORY_CARE_PROVIDER_SITE_OTHER): Payer: BC Managed Care – PPO | Admitting: Family Medicine

## 2013-09-26 DIAGNOSIS — Z5181 Encounter for therapeutic drug level monitoring: Secondary | ICD-10-CM

## 2013-09-26 DIAGNOSIS — I81 Portal vein thrombosis: Secondary | ICD-10-CM

## 2013-09-26 DIAGNOSIS — Z7901 Long term (current) use of anticoagulants: Secondary | ICD-10-CM

## 2013-09-26 LAB — POCT INR: INR: 2.1

## 2013-10-04 ENCOUNTER — Telehealth: Payer: Self-pay | Admitting: *Deleted

## 2013-10-04 NOTE — Telephone Encounter (Signed)
FMLA forms placed in your inbox, (I attached the last FMLA forms too)

## 2013-10-04 NOTE — Telephone Encounter (Signed)
Done and in IN box Selena BattenKim may want to look it over

## 2013-10-21 NOTE — Telephone Encounter (Signed)
Forms faxed

## 2013-10-24 ENCOUNTER — Ambulatory Visit (INDEPENDENT_AMBULATORY_CARE_PROVIDER_SITE_OTHER): Payer: BC Managed Care – PPO | Admitting: Family Medicine

## 2013-10-24 DIAGNOSIS — Z7901 Long term (current) use of anticoagulants: Secondary | ICD-10-CM

## 2013-10-24 DIAGNOSIS — Z5181 Encounter for therapeutic drug level monitoring: Secondary | ICD-10-CM

## 2013-10-24 DIAGNOSIS — I81 Portal vein thrombosis: Secondary | ICD-10-CM

## 2013-10-24 LAB — POCT INR: INR: 4.2

## 2013-11-18 ENCOUNTER — Ambulatory Visit (INDEPENDENT_AMBULATORY_CARE_PROVIDER_SITE_OTHER): Payer: BC Managed Care – PPO | Admitting: Family Medicine

## 2013-11-18 DIAGNOSIS — Z5181 Encounter for therapeutic drug level monitoring: Secondary | ICD-10-CM

## 2013-11-18 DIAGNOSIS — Z7901 Long term (current) use of anticoagulants: Secondary | ICD-10-CM

## 2013-11-18 DIAGNOSIS — I81 Portal vein thrombosis: Secondary | ICD-10-CM

## 2013-11-18 LAB — POCT INR: INR: 1.6

## 2013-11-25 ENCOUNTER — Other Ambulatory Visit: Payer: Self-pay | Admitting: Family Medicine

## 2013-11-25 NOTE — Telephone Encounter (Signed)
Rout to [facility] 

## 2013-12-03 ENCOUNTER — Telehealth: Payer: Self-pay | Admitting: *Deleted

## 2013-12-03 NOTE — Telephone Encounter (Signed)
Patient calling inquiring about if we had his blood type on file. I advised there was no record of it and the easiest way to find out was to donate blood one time. He verbalized understanding.

## 2013-12-09 ENCOUNTER — Other Ambulatory Visit: Payer: Self-pay | Admitting: *Deleted

## 2013-12-16 ENCOUNTER — Ambulatory Visit (INDEPENDENT_AMBULATORY_CARE_PROVIDER_SITE_OTHER): Payer: BC Managed Care – PPO | Admitting: Family Medicine

## 2013-12-16 DIAGNOSIS — Z5181 Encounter for therapeutic drug level monitoring: Secondary | ICD-10-CM

## 2013-12-16 DIAGNOSIS — I81 Portal vein thrombosis: Secondary | ICD-10-CM

## 2013-12-16 DIAGNOSIS — Z7901 Long term (current) use of anticoagulants: Secondary | ICD-10-CM

## 2013-12-16 LAB — POCT INR: INR: 1.7

## 2014-01-06 ENCOUNTER — Ambulatory Visit (INDEPENDENT_AMBULATORY_CARE_PROVIDER_SITE_OTHER): Payer: BC Managed Care – PPO | Admitting: Family Medicine

## 2014-01-06 DIAGNOSIS — Z5181 Encounter for therapeutic drug level monitoring: Secondary | ICD-10-CM

## 2014-01-06 DIAGNOSIS — I81 Portal vein thrombosis: Secondary | ICD-10-CM

## 2014-01-06 DIAGNOSIS — Z7901 Long term (current) use of anticoagulants: Secondary | ICD-10-CM

## 2014-01-06 LAB — POCT INR: INR: 3.2

## 2014-02-03 ENCOUNTER — Ambulatory Visit: Payer: BC Managed Care – PPO

## 2014-02-10 ENCOUNTER — Ambulatory Visit (INDEPENDENT_AMBULATORY_CARE_PROVIDER_SITE_OTHER): Payer: BC Managed Care – PPO | Admitting: Family Medicine

## 2014-02-10 DIAGNOSIS — Z5181 Encounter for therapeutic drug level monitoring: Secondary | ICD-10-CM

## 2014-02-10 DIAGNOSIS — Z7901 Long term (current) use of anticoagulants: Secondary | ICD-10-CM

## 2014-02-10 DIAGNOSIS — I81 Portal vein thrombosis: Secondary | ICD-10-CM

## 2014-02-10 LAB — POCT INR: INR: 2.9

## 2014-02-13 ENCOUNTER — Other Ambulatory Visit: Payer: Self-pay | Admitting: *Deleted

## 2014-02-13 MED ORDER — WARFARIN SODIUM 6 MG PO TABS: ORAL_TABLET | ORAL | Status: DC

## 2014-02-13 NOTE — Telephone Encounter (Signed)
Received fax requesting a 90 day supply of Rx, [facility]

## 2014-02-21 IMAGING — CR DG LUMBAR SPINE COMPLETE 4+V
5 series · 5 of 5 positions shown · non-contrast
Comparison: CT abdomen and pelvis 03/13/2012.

CLINICAL DATA: Low back pain

EXAM:
LUMBAR SPINE - COMPLETE 4+ VIEW

[view not recorded (1 of 5)]
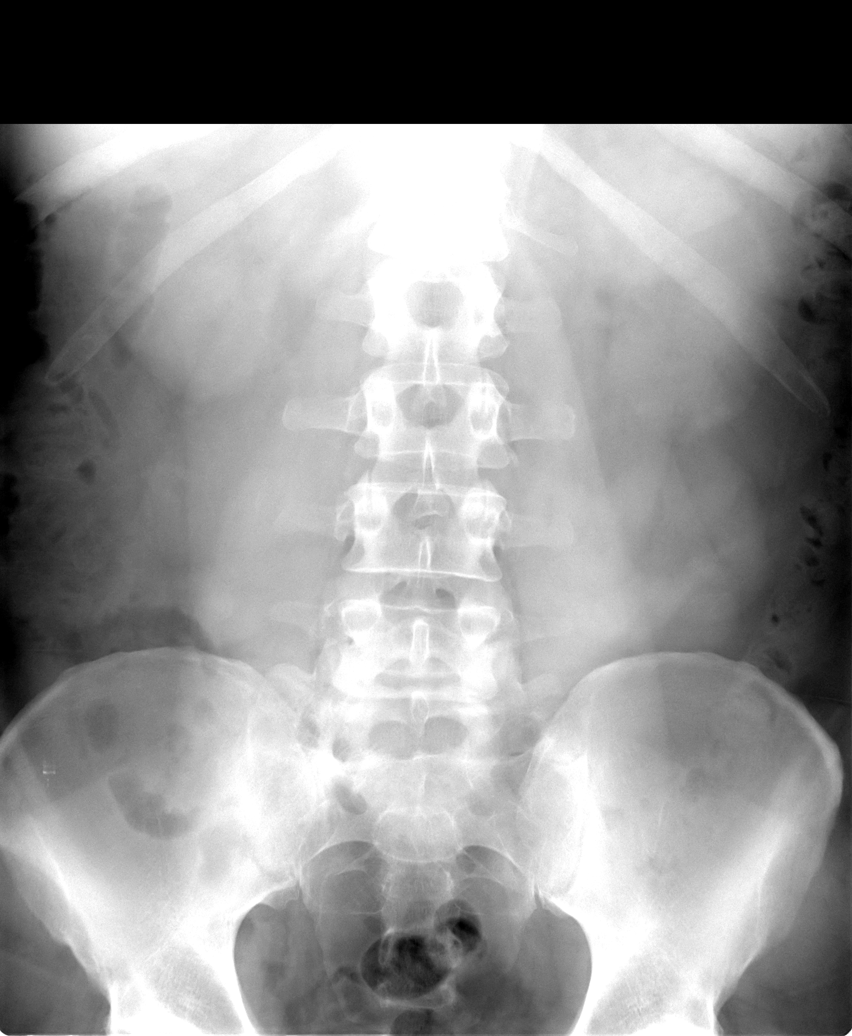

[view not recorded (2 of 5)]
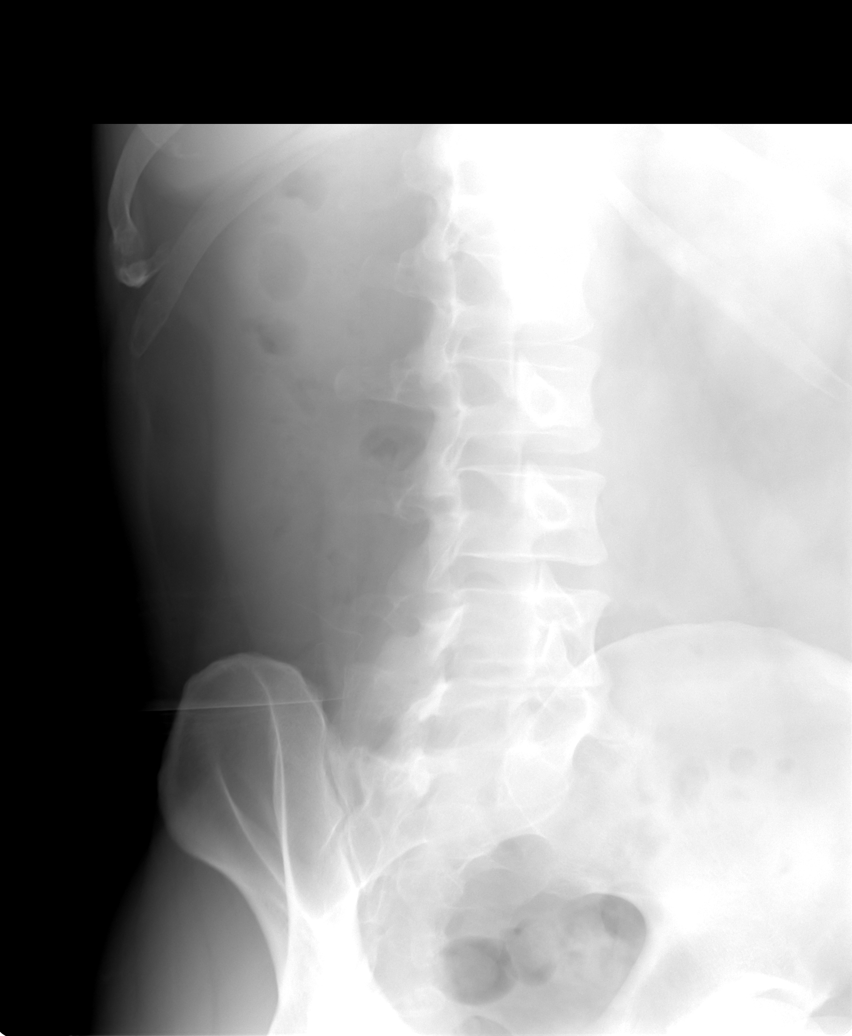

[view not recorded (3 of 5)]
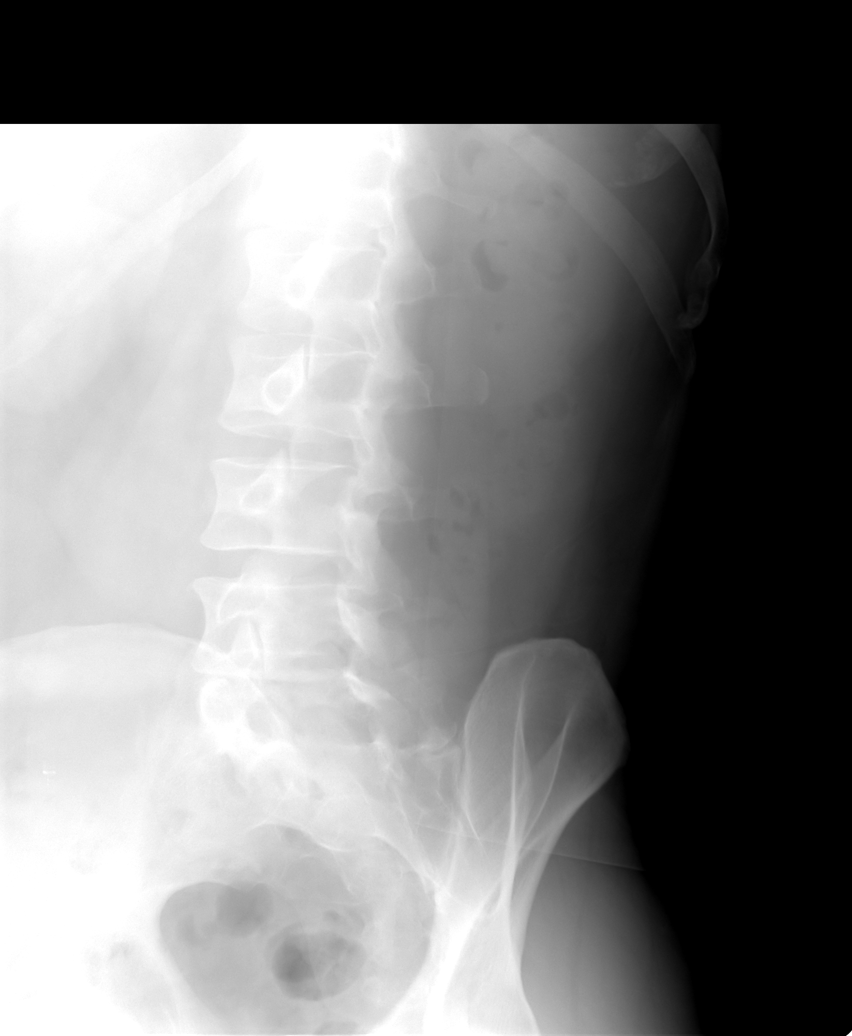

[view not recorded (4 of 5)]
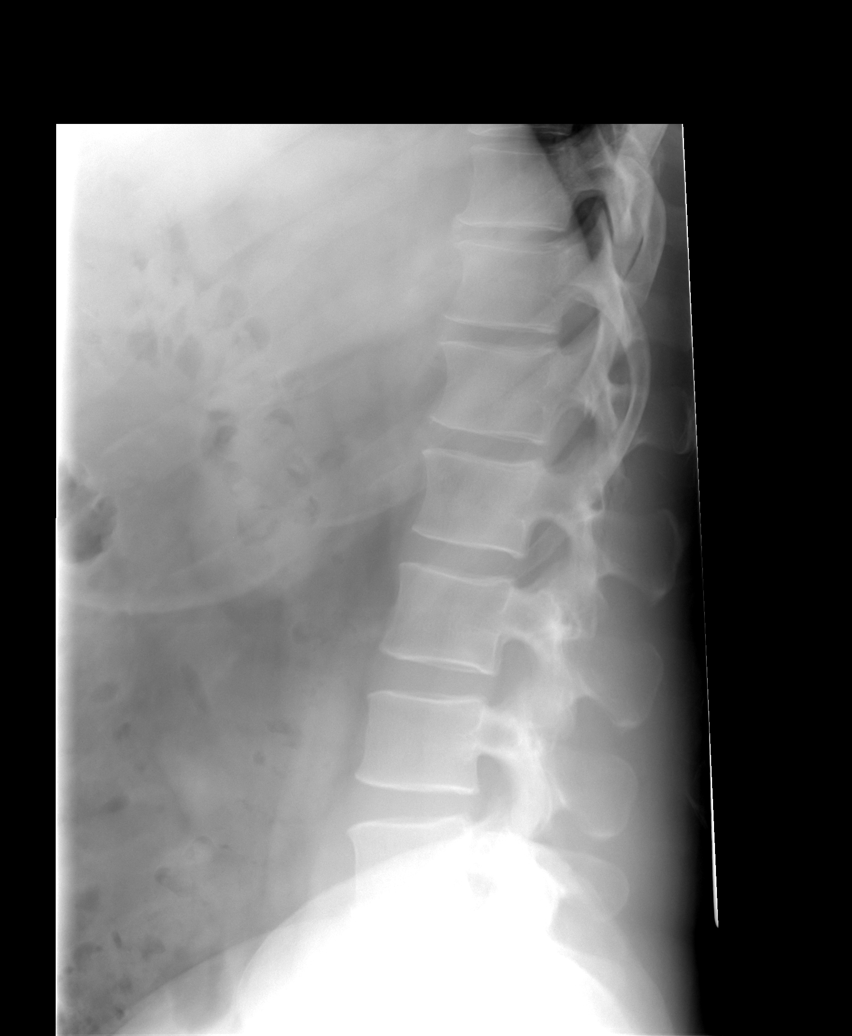

[view not recorded (5 of 5)]
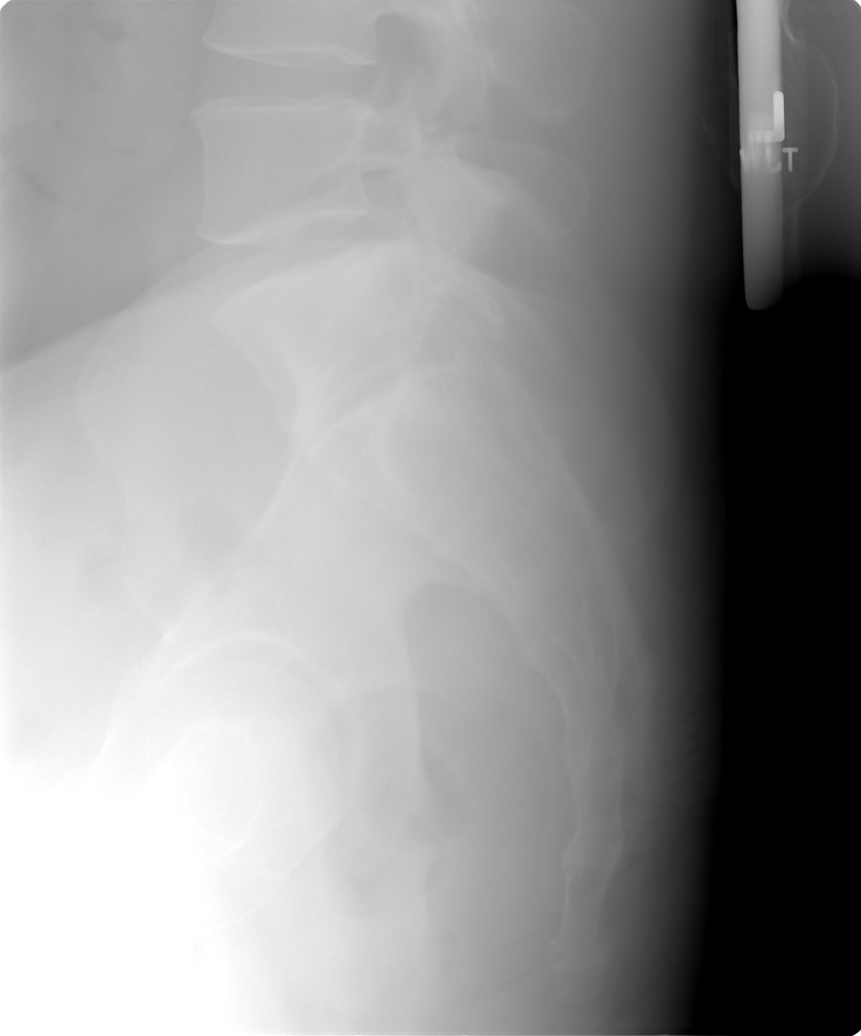

[5 of 5 positions shown; findings below may reference images not displayed]

FINDINGS: There is no evidence of lumbar spine fracture. Alignment is normal.
Intervertebral disc spaces are maintained.
IMPRESSION: Negative.

## 2014-03-24 ENCOUNTER — Ambulatory Visit: Payer: BC Managed Care – PPO

## 2014-03-27 ENCOUNTER — Ambulatory Visit (INDEPENDENT_AMBULATORY_CARE_PROVIDER_SITE_OTHER): Payer: BC Managed Care – PPO | Admitting: Family Medicine

## 2014-03-27 ENCOUNTER — Encounter (INDEPENDENT_AMBULATORY_CARE_PROVIDER_SITE_OTHER): Payer: Self-pay

## 2014-03-27 DIAGNOSIS — Z5181 Encounter for therapeutic drug level monitoring: Secondary | ICD-10-CM

## 2014-03-27 DIAGNOSIS — I81 Portal vein thrombosis: Secondary | ICD-10-CM

## 2014-03-27 DIAGNOSIS — Z7901 Long term (current) use of anticoagulants: Secondary | ICD-10-CM

## 2014-03-27 LAB — POCT INR: INR: 3.1

## 2014-04-11 IMAGING — CR DG HAND COMPLETE 3+V*L*
3 series · 3 of 3 positions shown · non-contrast
Comparison: May 05, 2013

CLINICAL DATA: Pain post trauma

EXAM:
LEFT HAND - COMPLETE 3+ VIEW

[view not recorded (1 of 3)]
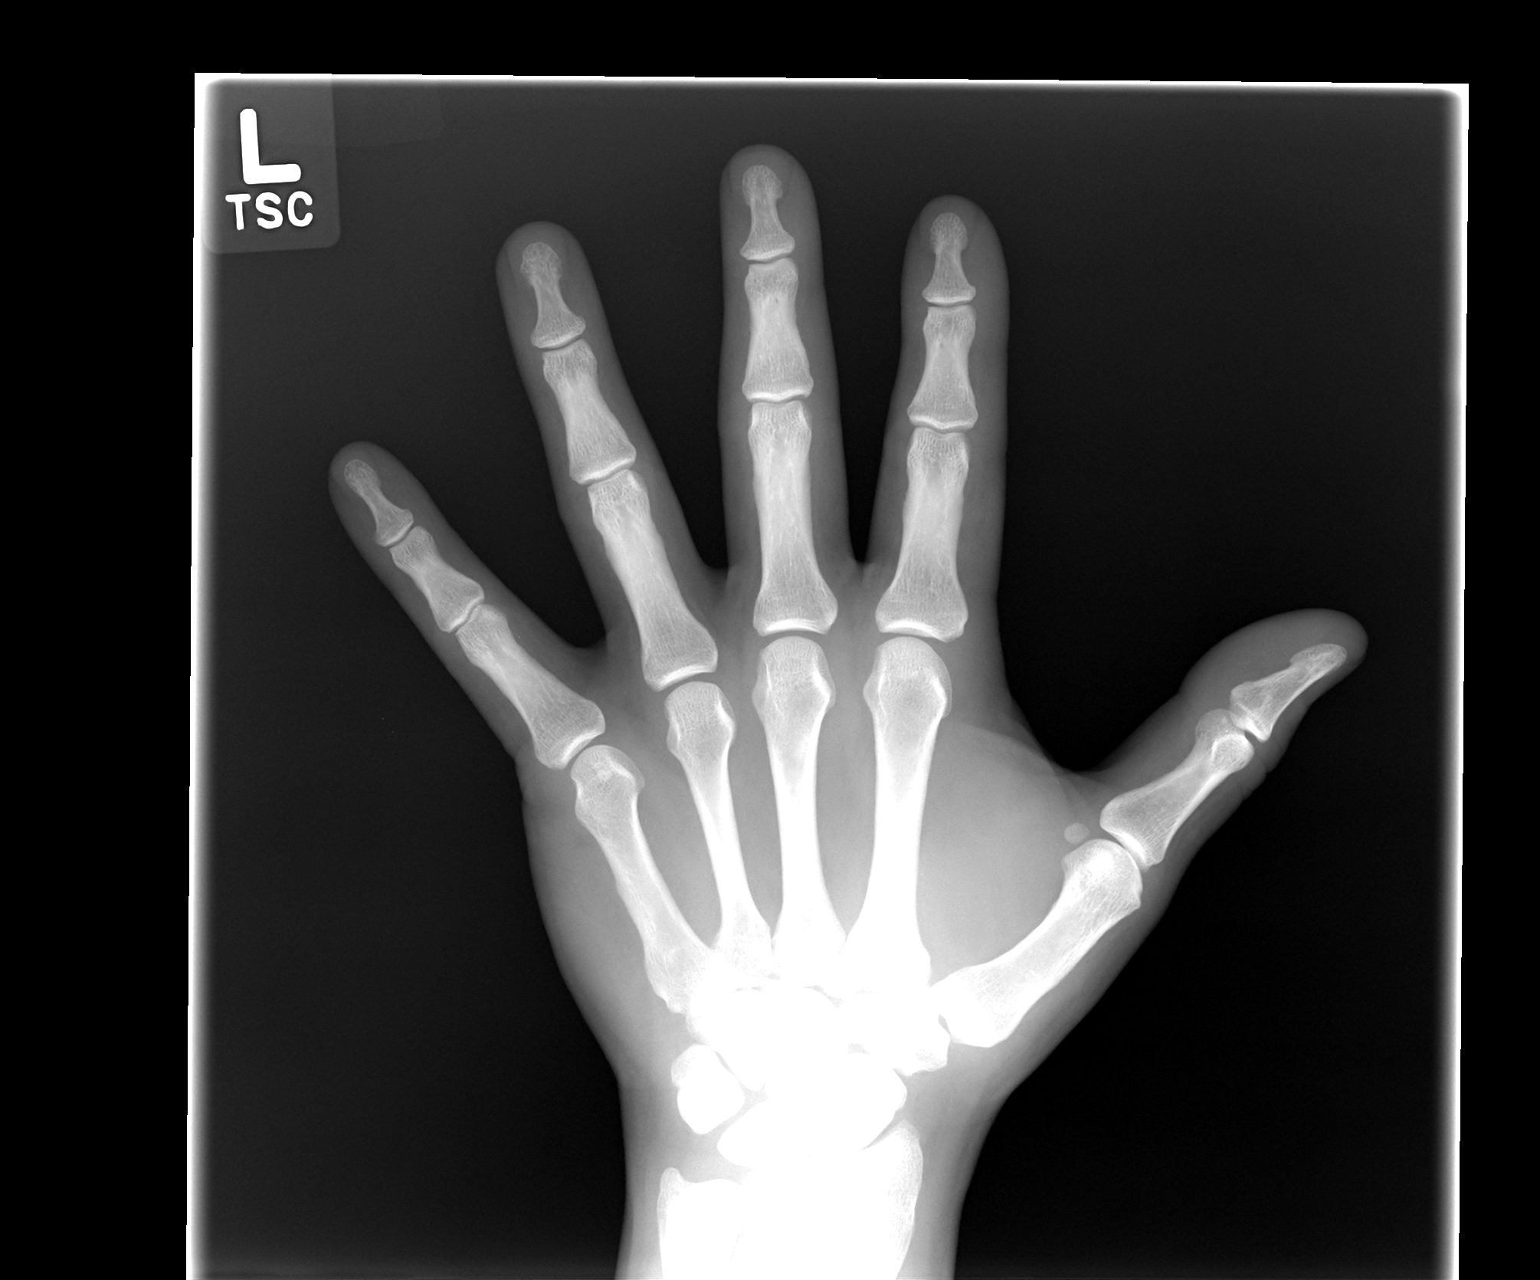

[view not recorded (2 of 3)]
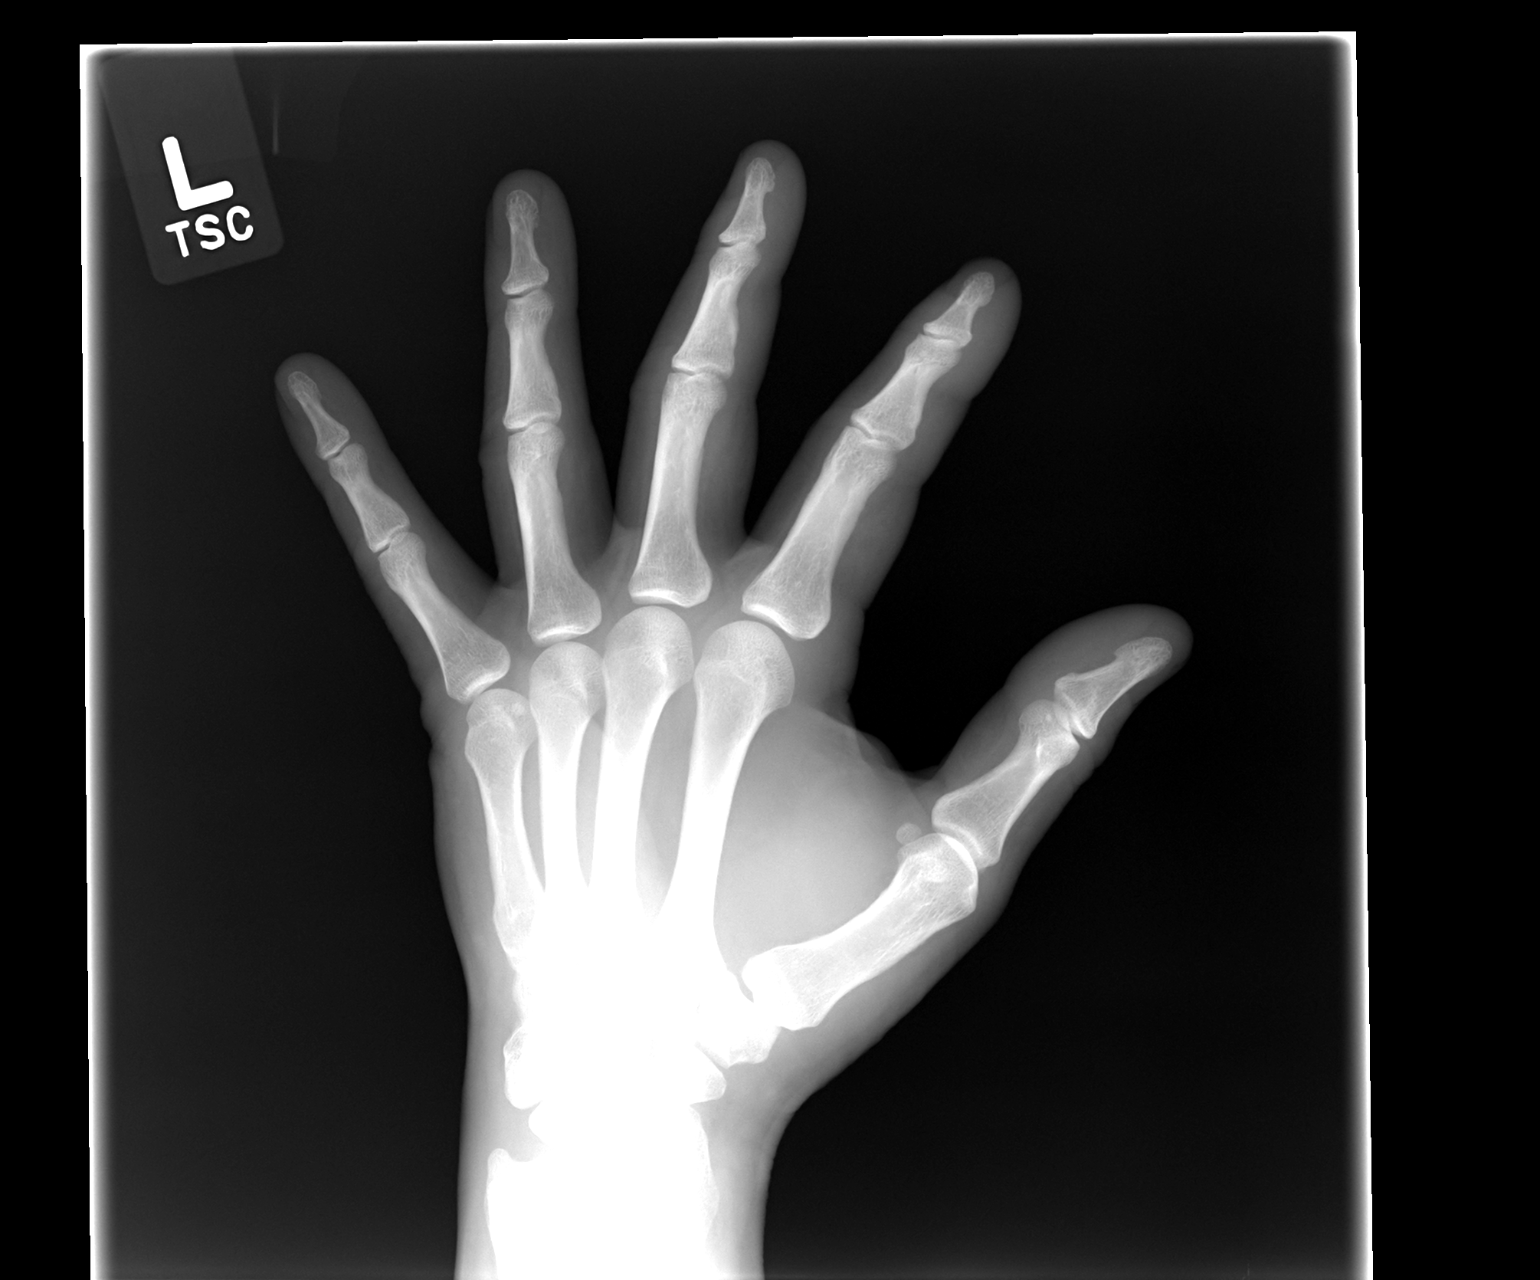

[view not recorded (3 of 3)]
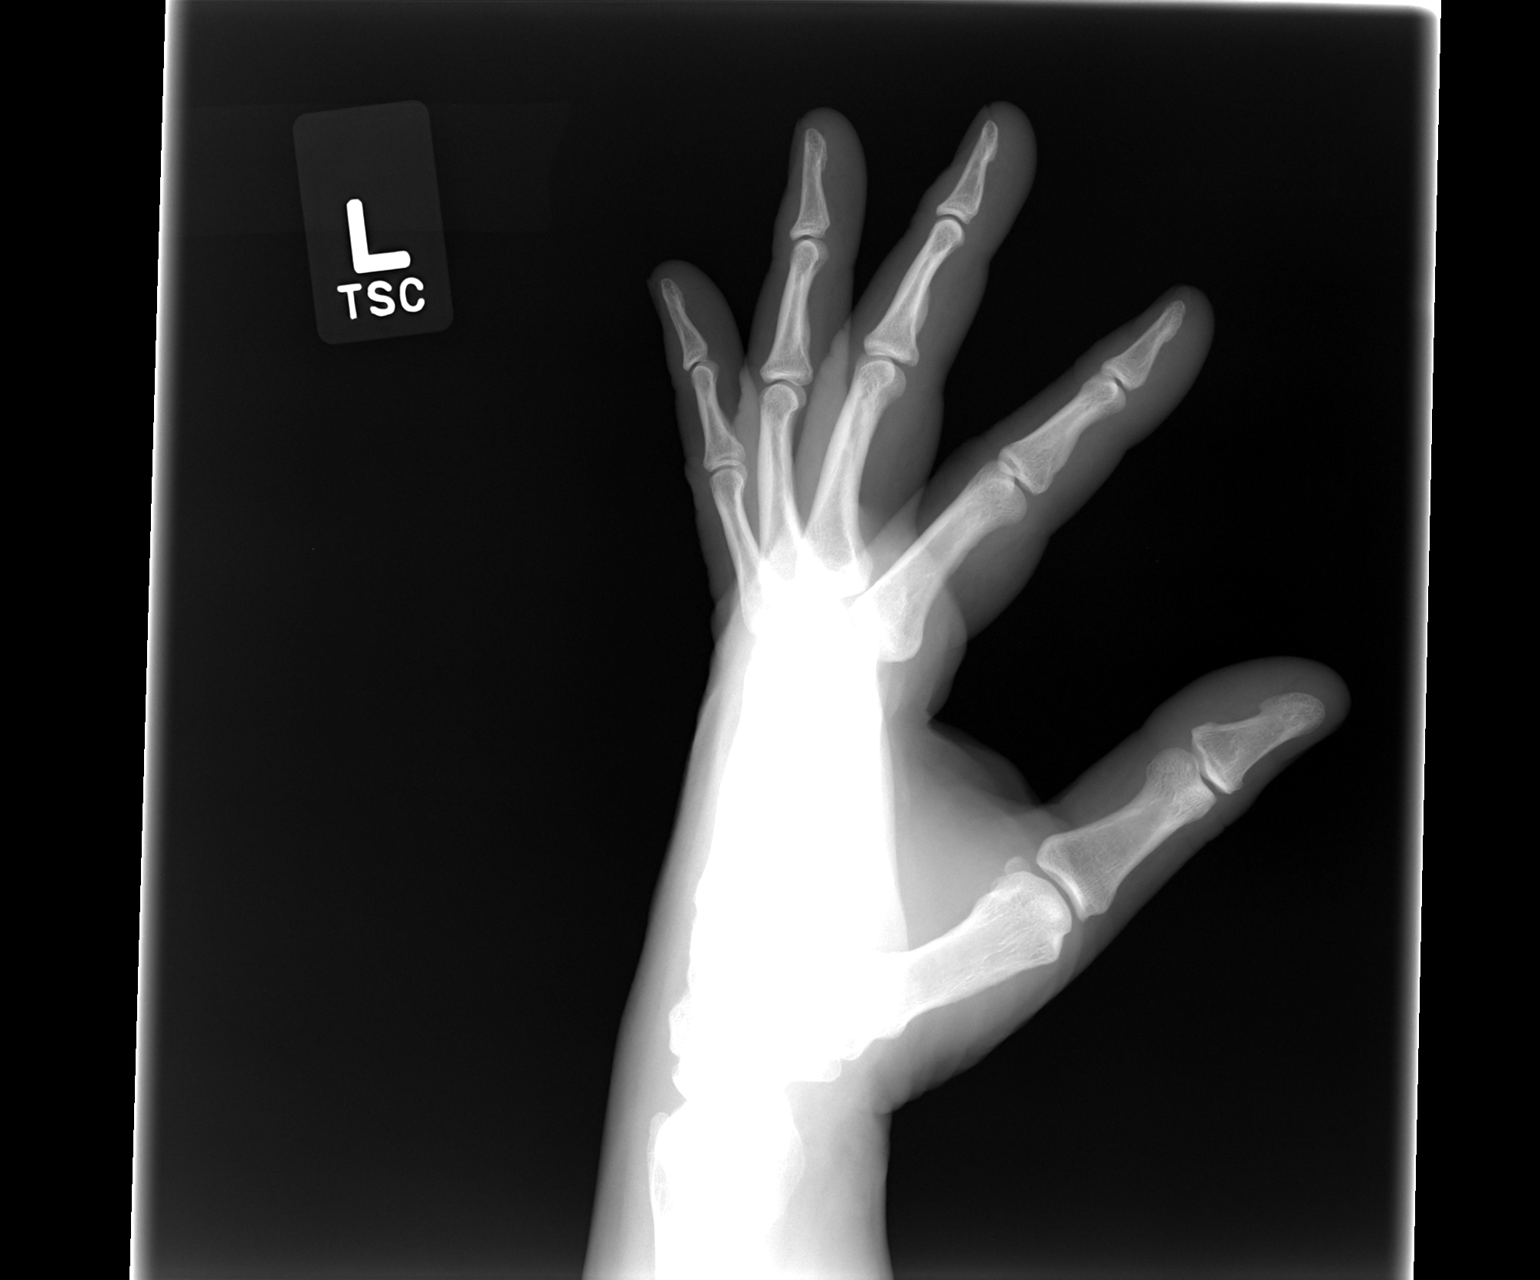

[3 of 3 positions shown; findings below may reference images not displayed]

FINDINGS: Frontal, oblique, and lateral views were obtained. There is no
fracture or dislocation. Joint spaces appear intact. No erosive
change.
IMPRESSION: No abnormality noted.

## 2014-05-12 ENCOUNTER — Ambulatory Visit: Payer: BC Managed Care – PPO

## 2014-05-14 ENCOUNTER — Ambulatory Visit (INDEPENDENT_AMBULATORY_CARE_PROVIDER_SITE_OTHER): Payer: BC Managed Care – PPO | Admitting: Family

## 2014-05-14 DIAGNOSIS — I81 Portal vein thrombosis: Secondary | ICD-10-CM

## 2014-05-14 LAB — POCT INR: INR: 2.1

## 2014-05-14 NOTE — Patient Instructions (Signed)
Continue to take 6mg  daily.  Re-check in 6 weeks at [facility] office.   Anticoagulation Dose Instructions as of 05/14/2014      Glynis SmilesSun Mon Tue Wed Thu Fri Sat   New Dose 6 mg 6 mg 6 mg 6 mg 6 mg 6 mg 6 mg    Description        Continue to take 6mg  daily.  Re-check in 6 weeks at [facility] office.

## 2014-06-25 ENCOUNTER — Ambulatory Visit (INDEPENDENT_AMBULATORY_CARE_PROVIDER_SITE_OTHER): Payer: BC Managed Care – PPO

## 2014-06-25 DIAGNOSIS — I81 Portal vein thrombosis: Secondary | ICD-10-CM

## 2014-06-25 LAB — POCT INR: INR: 2

## 2014-06-25 NOTE — Progress Notes (Signed)
No change in warfarin dose  Re check in a year

## 2014-06-25 NOTE — Progress Notes (Signed)
Sorry- I meant a month

## 2014-07-01 ENCOUNTER — Other Ambulatory Visit: Payer: Self-pay | Admitting: Family Medicine

## 2014-07-30 ENCOUNTER — Telehealth: Payer: Self-pay | Admitting: Family Medicine

## 2014-07-30 NOTE — Telephone Encounter (Signed)
Pt called he is having surgery with dr Durward Fortescollins [facility].  Dr Thomasena Ediscollins wants dr tower to take mr Hiltner off coumadin and bridge with Lovenox..  Pt has instruction for do collins does he need to bring this in.  Surgery has not be scheduled yet

## 2014-07-30 NOTE — Telephone Encounter (Signed)
Spoke with Mr. [name] [name].  His surgery is not scheduled yet due to waiting on clearance from Dr. Milinda Antisower.  He is having his Left Knee scoped.  He is not sure if this will be outpatient or if he will have to stay the night.  He has not ever given himself injections but thinks he can figure it out.  Patient actually has a pre-operative clearance form with him.  When ask that last night he had seen Dr. Milinda Antisower, he thought is had been close to a year.  Office visit schedule for 08/04/2014 at 3:00pm with Dr. Milinda Antisower for surgical clearance and he will bring form with him to office visit.

## 2014-07-30 NOTE — Telephone Encounter (Signed)
Please let me know when his surgery is scheduled / what he is having done/ how long he will stay in the hospital ?  Does he know how to do his own injections? Thanks   Please get last note from his doctor as well

## 2014-07-30 NOTE — Telephone Encounter (Signed)
We will go ahead and review everything at that visit - and discuss the process for lovenox  Thanks

## 2014-07-31 NOTE — Telephone Encounter (Signed)
Office note requested from Dr. Thomasena Edisollins office.

## 2014-08-04 ENCOUNTER — Ambulatory Visit (INDEPENDENT_AMBULATORY_CARE_PROVIDER_SITE_OTHER): Payer: BLUE CROSS/BLUE SHIELD | Admitting: Family Medicine

## 2014-08-04 ENCOUNTER — Encounter: Payer: Self-pay | Admitting: Family Medicine

## 2014-08-04 VITALS — BP 116/78 | HR 105 | Temp 97.8°F | Ht 73.0 in | Wt 240.8 lb

## 2014-08-04 DIAGNOSIS — Z01818 Encounter for other preprocedural examination: Secondary | ICD-10-CM

## 2014-08-04 DIAGNOSIS — Z7901 Long term (current) use of anticoagulants: Secondary | ICD-10-CM

## 2014-08-04 DIAGNOSIS — I81 Portal vein thrombosis: Secondary | ICD-10-CM

## 2014-08-04 DIAGNOSIS — Z5181 Encounter for therapeutic drug level monitoring: Secondary | ICD-10-CM

## 2014-08-04 LAB — POCT INR: INR: 2.2

## 2014-08-04 NOTE — Patient Instructions (Signed)
No restrictions for knee surgery  You will need to stop coumadin 5 days prior to the operation and start lovenox 3 days before surgery and stop 24 hours before surgery, then resume 1 day after surgery (IF ok with your surgeon) Then we will get back up to speed on coumadin   I will get you px for lovenox and more details when you or Dr Thomasena Edisollins gives us a surgery date

## 2014-08-04 NOTE — Assessment & Plan Note (Signed)
On chronic coumadin  Due to INR -will get today  Will need lovenox bridge for surgery (stop coumadin 5 d prior - take lovenox 2 d after that and stop 24 hours before surgery- then resume 24 hours after if no bleeding until coumadin is therapeutic)

## 2014-08-04 NOTE — Progress Notes (Signed)
Pre visit review using our clinic review tool, if applicable. No additional management support is needed unless otherwise documented below in the visit note. 
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
## 2014-08-04 NOTE — Assessment & Plan Note (Signed)
No restrictions for knee arthroscopic surgery  Will bridge lovenox for hx of chronic anticoag for portal vein thrombosis   EKG nl  No cardiac or pulm hx  No drug allergies or problems with aneth

## 2014-08-05 ENCOUNTER — Telehealth: Payer: Self-pay | Admitting: *Deleted

## 2014-08-05 NOTE — Telephone Encounter (Signed)
Fax[facility] [phone].

## 2014-08-18 ENCOUNTER — Telehealth: Payer: Self-pay | Admitting: Family Medicine

## 2014-08-18 MED ORDER — ENOXAPARIN SODIUM 100 MG/ML ~~LOC~~ SOLN: 100.0000 mg | Freq: Two times a day (BID) | SUBCUTANEOUS | Status: DC

## 2014-08-18 NOTE — Telephone Encounter (Signed)
Please let pt know I got word that his surgey date is 2/18 Start holding coumadin on 2/13 Start lovenox 100 mg [place] bid on 2/15 Stop the lovenox as close to 24 hours before procedure as you can   Re start lovenox and coumadin when surgeon says it is safe  Take your regular coumadin dose along with lovenox  Get INR checked about 3 days later - and we will go from there  Let me know if he has any questions  I printed lovenox px -in IN box for pick up or fax  Thanks

## 2014-08-18 NOTE — Telephone Encounter (Signed)
notified patient of Dr Royden Purlower's comments. Sent a letter with info as well. Per patient request [facility]

## 2014-09-01 ENCOUNTER — Telehealth: Payer: Self-pay | Admitting: Radiology

## 2014-09-01 ENCOUNTER — Ambulatory Visit (INDEPENDENT_AMBULATORY_CARE_PROVIDER_SITE_OTHER): Payer: BLUE CROSS/BLUE SHIELD | Admitting: Family Medicine

## 2014-09-01 ENCOUNTER — Encounter: Payer: Self-pay | Admitting: Family Medicine

## 2014-09-01 VITALS — BP 125/80 | HR 103 | Temp 98.2°F | Ht 73.0 in | Wt 240.8 lb

## 2014-09-01 DIAGNOSIS — I81 Portal vein thrombosis: Secondary | ICD-10-CM

## 2014-09-01 DIAGNOSIS — Z5181 Encounter for therapeutic drug level monitoring: Secondary | ICD-10-CM

## 2014-09-01 DIAGNOSIS — Z7901 Long term (current) use of anticoagulants: Secondary | ICD-10-CM | POA: Insufficient documentation

## 2014-09-01 LAB — POCT INR: INR: 1.4

## 2014-09-01 MED ORDER — WARFARIN SODIUM 5 MG PO TABS: ORAL_TABLET | ORAL | Status: DC

## 2014-09-01 MED ORDER — ENOXAPARIN SODIUM 100 MG/ML ~~LOC~~ SOLN: 100.0000 mg | Freq: Two times a day (BID) | SUBCUTANEOUS | Status: DC

## 2014-09-01 NOTE — Telephone Encounter (Signed)
Sent that now as well-thanks

## 2014-09-01 NOTE — Patient Instructions (Addendum)
INR today If not at goal we will need to continue lovenox until it is

## 2014-09-01 NOTE — Assessment & Plan Note (Signed)
Here for re check of INR after surgery  He did not continue lovenox past Saturday as asked  No abd pain or s/s of clot  Check INR today and if not at goal (2-3)- will need to resume the lovenox until it is Finishing abx

## 2014-09-01 NOTE — Telephone Encounter (Signed)
Please call in another RX of Lovenox, [place]. [facility]
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
## 2014-09-01 NOTE — Telephone Encounter (Signed)
Also needs injections, left his out of town

## 2014-09-01 NOTE — Telephone Encounter (Signed)
I sent px for the 5 mg pills  Thanks

## 2014-09-01 NOTE — Assessment & Plan Note (Signed)
On 6 mg daily now after surgery-doing well / no bleeding or bruising  Did not continue lovenox after sat as asked  Goal 2-3  Will need to continue lovenox bridge if not at goal yet Finishing last day of abx (thinks it is keflex?) Knee surgery went very well

## 2014-09-04 ENCOUNTER — Other Ambulatory Visit: Payer: BLUE CROSS/BLUE SHIELD

## 2014-09-04 ENCOUNTER — Ambulatory Visit (INDEPENDENT_AMBULATORY_CARE_PROVIDER_SITE_OTHER): Payer: BLUE CROSS/BLUE SHIELD | Admitting: General Practice

## 2014-09-04 DIAGNOSIS — I81 Portal vein thrombosis: Secondary | ICD-10-CM

## 2014-09-04 DIAGNOSIS — Z7901 Long term (current) use of anticoagulants: Secondary | ICD-10-CM

## 2014-09-04 DIAGNOSIS — Z5181 Encounter for therapeutic drug level monitoring: Secondary | ICD-10-CM

## 2014-09-04 LAB — POCT INR: INR: 1.7

## 2014-09-04 NOTE — Progress Notes (Signed)
Pre visit review using our clinic review tool, if applicable. No additional management support is needed unless otherwise documented below in the visit note. 

## 2014-09-18 ENCOUNTER — Ambulatory Visit: Payer: BLUE CROSS/BLUE SHIELD

## 2014-09-18 ENCOUNTER — Ambulatory Visit (INDEPENDENT_AMBULATORY_CARE_PROVIDER_SITE_OTHER): Payer: BLUE CROSS/BLUE SHIELD | Admitting: Family

## 2014-09-18 DIAGNOSIS — I81 Portal vein thrombosis: Secondary | ICD-10-CM

## 2014-09-18 DIAGNOSIS — Z5181 Encounter for therapeutic drug level monitoring: Secondary | ICD-10-CM

## 2014-09-18 DIAGNOSIS — Z7901 Long term (current) use of anticoagulants: Secondary | ICD-10-CM

## 2014-09-18 LAB — POCT INR: INR: 2.3

## 2014-10-13 ENCOUNTER — Ambulatory Visit (INDEPENDENT_AMBULATORY_CARE_PROVIDER_SITE_OTHER): Payer: BLUE CROSS/BLUE SHIELD | Admitting: General Practice

## 2014-10-13 DIAGNOSIS — I81 Portal vein thrombosis: Secondary | ICD-10-CM

## 2014-10-13 LAB — POCT INR: INR: 2.2

## 2014-10-13 NOTE — Progress Notes (Signed)
Pre visit review using our clinic review tool, if applicable. No additional management support is needed unless otherwise documented below in the visit note. 

## 2014-10-14 ENCOUNTER — Other Ambulatory Visit: Payer: Self-pay | Admitting: Family Medicine

## 2014-11-10 ENCOUNTER — Ambulatory Visit: Payer: BLUE CROSS/BLUE SHIELD

## 2014-11-13 ENCOUNTER — Ambulatory Visit (INDEPENDENT_AMBULATORY_CARE_PROVIDER_SITE_OTHER): Payer: BLUE CROSS/BLUE SHIELD | Admitting: General Practice

## 2014-11-13 DIAGNOSIS — I81 Portal vein thrombosis: Secondary | ICD-10-CM

## 2014-11-13 DIAGNOSIS — Z7901 Long term (current) use of anticoagulants: Secondary | ICD-10-CM

## 2014-11-13 DIAGNOSIS — Z5181 Encounter for therapeutic drug level monitoring: Secondary | ICD-10-CM

## 2014-11-13 LAB — POCT INR: INR: 2.6

## 2014-11-13 NOTE — Progress Notes (Signed)
Pre visit review using our clinic review tool, if applicable. No additional management support is needed unless otherwise documented below in the visit note. 

## 2014-12-25 ENCOUNTER — Ambulatory Visit (INDEPENDENT_AMBULATORY_CARE_PROVIDER_SITE_OTHER): Payer: BLUE CROSS/BLUE SHIELD | Admitting: General Practice

## 2014-12-25 DIAGNOSIS — Z5181 Encounter for therapeutic drug level monitoring: Secondary | ICD-10-CM

## 2014-12-25 DIAGNOSIS — Z7901 Long term (current) use of anticoagulants: Secondary | ICD-10-CM

## 2014-12-25 DIAGNOSIS — I81 Portal vein thrombosis: Secondary | ICD-10-CM | POA: Diagnosis not present

## 2014-12-25 LAB — POCT INR: INR: 1.9

## 2014-12-25 NOTE — Progress Notes (Signed)
Pre visit review using our clinic review tool, if applicable. No additional management support is needed unless otherwise documented below in the visit note. 

## 2015-01-22 LAB — POCT INR: INR: 2.7

## 2015-01-29 ENCOUNTER — Ambulatory Visit (INDEPENDENT_AMBULATORY_CARE_PROVIDER_SITE_OTHER): Payer: BLUE CROSS/BLUE SHIELD | Admitting: General Practice

## 2015-01-29 DIAGNOSIS — I81 Portal vein thrombosis: Secondary | ICD-10-CM

## 2015-01-29 DIAGNOSIS — Z7901 Long term (current) use of anticoagulants: Secondary | ICD-10-CM

## 2015-01-29 DIAGNOSIS — Z5181 Encounter for therapeutic drug level monitoring: Secondary | ICD-10-CM | POA: Diagnosis not present

## 2015-01-29 NOTE — Progress Notes (Signed)
Pre visit review using our clinic review tool, if applicable. No additional management support is needed unless otherwise documented below in the visit note. 

## 2015-03-12 ENCOUNTER — Ambulatory Visit (INDEPENDENT_AMBULATORY_CARE_PROVIDER_SITE_OTHER): Payer: BLUE CROSS/BLUE SHIELD | Admitting: General Practice

## 2015-03-12 DIAGNOSIS — I81 Portal vein thrombosis: Secondary | ICD-10-CM | POA: Diagnosis not present

## 2015-03-12 DIAGNOSIS — Z7901 Long term (current) use of anticoagulants: Secondary | ICD-10-CM | POA: Diagnosis not present

## 2015-03-12 DIAGNOSIS — Z5181 Encounter for therapeutic drug level monitoring: Secondary | ICD-10-CM

## 2015-03-12 LAB — POCT INR: INR: 3.6

## 2015-03-12 NOTE — Progress Notes (Signed)
Pre visit review using our clinic review tool, if applicable. No additional management support is needed unless otherwise documented below in the visit note. 

## 2015-04-09 ENCOUNTER — Ambulatory Visit: Payer: BLUE CROSS/BLUE SHIELD

## 2015-04-16 ENCOUNTER — Ambulatory Visit: Payer: BLUE CROSS/BLUE SHIELD

## 2015-04-21 ENCOUNTER — Ambulatory Visit: Payer: BLUE CROSS/BLUE SHIELD

## 2015-04-30 ENCOUNTER — Ambulatory Visit: Payer: BLUE CROSS/BLUE SHIELD

## 2015-11-06 ENCOUNTER — Other Ambulatory Visit: Payer: Self-pay | Admitting: Family Medicine

## 2015-11-06 ENCOUNTER — Other Ambulatory Visit: Payer: Self-pay | Admitting: General Practice

## 2015-11-06 MED ORDER — WARFARIN SODIUM 6 MG PO TABS: 6.0000 mg | ORAL_TABLET | Freq: Every day | ORAL | Status: DC

## 2015-11-06 NOTE — Telephone Encounter (Signed)
[facility], [facility]

## 2015-12-23 ENCOUNTER — Other Ambulatory Visit: Payer: Self-pay | Admitting: General Practice

## 2015-12-23 ENCOUNTER — Ambulatory Visit (INDEPENDENT_AMBULATORY_CARE_PROVIDER_SITE_OTHER): Payer: BLUE CROSS/BLUE SHIELD | Admitting: General Practice

## 2015-12-23 DIAGNOSIS — I81 Portal vein thrombosis: Secondary | ICD-10-CM

## 2015-12-23 LAB — POCT INR: INR: 2.6

## 2015-12-23 MED ORDER — WARFARIN SODIUM 6 MG PO TABS: 6.0000 mg | ORAL_TABLET | Freq: Every day | ORAL | Status: DC

## 2015-12-23 NOTE — Progress Notes (Signed)
Pre visit review using our clinic review tool, if applicable. No additional management support is needed unless otherwise documented below in the visit note. 

## 2015-12-26 NOTE — Progress Notes (Signed)
I agree with this plan.

## 2016-02-15 ENCOUNTER — Ambulatory Visit: Payer: BLUE CROSS/BLUE SHIELD

## 2016-02-15 ENCOUNTER — Telehealth: Payer: Self-pay | Admitting: Family Medicine

## 2016-02-15 NOTE — Telephone Encounter (Signed)
eror/ltd

## 2016-02-17 ENCOUNTER — Ambulatory Visit (INDEPENDENT_AMBULATORY_CARE_PROVIDER_SITE_OTHER): Payer: BLUE CROSS/BLUE SHIELD | Admitting: General Practice

## 2016-02-17 DIAGNOSIS — Z5181 Encounter for therapeutic drug level monitoring: Secondary | ICD-10-CM | POA: Diagnosis not present

## 2016-02-17 DIAGNOSIS — Z7901 Long term (current) use of anticoagulants: Secondary | ICD-10-CM

## 2016-02-17 DIAGNOSIS — I81 Portal vein thrombosis: Secondary | ICD-10-CM

## 2016-02-17 LAB — POCT INR: INR: 2.5

## 2016-02-17 NOTE — Progress Notes (Signed)
I agree with this plan.

## 2016-03-20 ENCOUNTER — Other Ambulatory Visit: Payer: Self-pay | Admitting: Family Medicine

## 2016-03-21 NOTE — Telephone Encounter (Signed)
Electronic refill request please advise  

## 2016-03-21 NOTE — Telephone Encounter (Signed)
Please refill times 3 

## 2016-04-13 ENCOUNTER — Ambulatory Visit: Payer: BLUE CROSS/BLUE SHIELD

## 2016-04-13 ENCOUNTER — Ambulatory Visit (INDEPENDENT_AMBULATORY_CARE_PROVIDER_SITE_OTHER): Payer: BLUE CROSS/BLUE SHIELD | Admitting: General Practice

## 2016-04-13 DIAGNOSIS — I81 Portal vein thrombosis: Secondary | ICD-10-CM

## 2016-04-13 DIAGNOSIS — Z5181 Encounter for therapeutic drug level monitoring: Secondary | ICD-10-CM | POA: Diagnosis not present

## 2016-04-13 DIAGNOSIS — Z7901 Long term (current) use of anticoagulants: Secondary | ICD-10-CM

## 2016-04-13 LAB — POCT INR: INR: 3.1

## 2016-04-14 NOTE — Progress Notes (Signed)
I agree with this plan.

## 2016-06-08 ENCOUNTER — Ambulatory Visit (INDEPENDENT_AMBULATORY_CARE_PROVIDER_SITE_OTHER): Payer: BLUE CROSS/BLUE SHIELD | Admitting: General Practice

## 2016-06-08 DIAGNOSIS — Z7901 Long term (current) use of anticoagulants: Secondary | ICD-10-CM

## 2016-06-08 DIAGNOSIS — I81 Portal vein thrombosis: Secondary | ICD-10-CM | POA: Diagnosis not present

## 2016-06-08 DIAGNOSIS — Z5181 Encounter for therapeutic drug level monitoring: Secondary | ICD-10-CM

## 2016-06-08 LAB — POCT INR: INR: 2.8

## 2016-06-08 NOTE — Patient Instructions (Signed)
Pre visit review using our clinic review tool, if applicable. No additional management support is needed unless otherwise documented below in the visit note. 

## 2016-06-08 NOTE — Progress Notes (Signed)
I agree with this plan.

## 2016-08-03 ENCOUNTER — Ambulatory Visit (INDEPENDENT_AMBULATORY_CARE_PROVIDER_SITE_OTHER): Payer: BLUE CROSS/BLUE SHIELD | Admitting: General Practice

## 2016-08-03 DIAGNOSIS — I81 Portal vein thrombosis: Secondary | ICD-10-CM

## 2016-08-03 DIAGNOSIS — Z5181 Encounter for therapeutic drug level monitoring: Secondary | ICD-10-CM

## 2016-08-03 DIAGNOSIS — Z7901 Long term (current) use of anticoagulants: Secondary | ICD-10-CM

## 2016-08-03 LAB — POCT INR: INR: 1.6

## 2016-08-03 NOTE — Progress Notes (Signed)
I agree with this plan.

## 2016-08-03 NOTE — Patient Instructions (Signed)
Pre visit review using our clinic review tool, if applicable. No additional management support is needed unless otherwise documented below in the visit note. 

## 2016-09-13 ENCOUNTER — Ambulatory Visit (INDEPENDENT_AMBULATORY_CARE_PROVIDER_SITE_OTHER): Payer: BLUE CROSS/BLUE SHIELD | Admitting: General Practice

## 2016-09-13 DIAGNOSIS — Z5181 Encounter for therapeutic drug level monitoring: Secondary | ICD-10-CM

## 2016-09-13 DIAGNOSIS — I81 Portal vein thrombosis: Secondary | ICD-10-CM

## 2016-09-13 LAB — POCT INR: INR: 2.9

## 2016-09-13 NOTE — Progress Notes (Signed)
I have reviewed and agree with the plan. 

## 2016-09-13 NOTE — Patient Instructions (Signed)
Pre visit review using our clinic review tool, if applicable. No additional management support is needed unless otherwise documented below in the visit note. 

## 2016-09-14 ENCOUNTER — Ambulatory Visit: Payer: BLUE CROSS/BLUE SHIELD

## 2016-10-26 ENCOUNTER — Ambulatory Visit (INDEPENDENT_AMBULATORY_CARE_PROVIDER_SITE_OTHER): Payer: BLUE CROSS/BLUE SHIELD | Admitting: General Practice

## 2016-10-26 DIAGNOSIS — I81 Portal vein thrombosis: Secondary | ICD-10-CM | POA: Diagnosis not present

## 2016-10-26 LAB — POCT INR: INR: 2.5

## 2016-10-26 NOTE — Progress Notes (Signed)
I have reviewed and agree with the plan. 

## 2016-10-26 NOTE — Patient Instructions (Signed)
Pre visit review using our clinic review tool, if applicable. No additional management support is needed unless otherwise documented below in the visit note. 

## 2016-11-30 ENCOUNTER — Ambulatory Visit (INDEPENDENT_AMBULATORY_CARE_PROVIDER_SITE_OTHER): Payer: BLUE CROSS/BLUE SHIELD | Admitting: General Practice

## 2016-11-30 DIAGNOSIS — I81 Portal vein thrombosis: Secondary | ICD-10-CM | POA: Diagnosis not present

## 2016-11-30 DIAGNOSIS — Z5181 Encounter for therapeutic drug level monitoring: Secondary | ICD-10-CM | POA: Insufficient documentation

## 2016-11-30 LAB — POCT INR: INR: 2.9

## 2016-11-30 NOTE — Progress Notes (Signed)
I have reviewed and agree with the plan. 

## 2016-11-30 NOTE — Patient Instructions (Signed)
Pre visit review using our clinic review tool, if applicable. No additional management support is needed unless otherwise documented below in the visit note. 

## 2016-12-22 ENCOUNTER — Other Ambulatory Visit: Payer: Self-pay | Admitting: Family Medicine

## 2016-12-22 ENCOUNTER — Other Ambulatory Visit: Payer: Self-pay | Admitting: General Practice

## 2016-12-22 MED ORDER — WARFARIN SODIUM 6 MG PO TABS: 6.0000 mg | ORAL_TABLET | Freq: Every day | ORAL | 1 refills | Status: DC

## 2016-12-22 NOTE — Telephone Encounter (Signed)
Routing to coumadin nurse who manages pt's coumadin

## 2017-01-18 ENCOUNTER — Ambulatory Visit (INDEPENDENT_AMBULATORY_CARE_PROVIDER_SITE_OTHER): Payer: BLUE CROSS/BLUE SHIELD | Admitting: General Practice

## 2017-01-18 DIAGNOSIS — I81 Portal vein thrombosis: Secondary | ICD-10-CM

## 2017-01-18 DIAGNOSIS — Z5181 Encounter for therapeutic drug level monitoring: Secondary | ICD-10-CM

## 2017-01-18 LAB — POCT INR: INR: 5.2

## 2017-01-18 NOTE — Patient Instructions (Signed)
Pre visit review using our clinic review tool, if applicable. No additional management support is needed unless otherwise documented below in the visit note. 

## 2017-01-18 NOTE — Progress Notes (Signed)
I have reviewed and agree with the plan. 

## 2017-02-15 ENCOUNTER — Ambulatory Visit (INDEPENDENT_AMBULATORY_CARE_PROVIDER_SITE_OTHER): Payer: BLUE CROSS/BLUE SHIELD | Admitting: General Practice

## 2017-02-15 DIAGNOSIS — I81 Portal vein thrombosis: Secondary | ICD-10-CM

## 2017-02-15 DIAGNOSIS — Z5181 Encounter for therapeutic drug level monitoring: Secondary | ICD-10-CM | POA: Diagnosis not present

## 2017-02-15 LAB — POCT INR: INR: 3.6

## 2017-02-15 NOTE — Patient Instructions (Signed)
Pre visit review using our clinic review tool, if applicable. No additional management support is needed unless otherwise documented below in the visit note. 

## 2017-02-15 NOTE — Progress Notes (Signed)
I have reviewed and agree with the plan. 

## 2017-03-15 ENCOUNTER — Ambulatory Visit (INDEPENDENT_AMBULATORY_CARE_PROVIDER_SITE_OTHER): Payer: BLUE CROSS/BLUE SHIELD | Admitting: General Practice

## 2017-03-15 DIAGNOSIS — Z5181 Encounter for therapeutic drug level monitoring: Secondary | ICD-10-CM | POA: Diagnosis not present

## 2017-03-15 DIAGNOSIS — I81 Portal vein thrombosis: Secondary | ICD-10-CM

## 2017-03-15 LAB — POCT INR: INR: 3.6

## 2017-03-15 NOTE — Progress Notes (Signed)
I have reviewed and agree with the plan. 

## 2017-03-15 NOTE — Patient Instructions (Signed)
Pre visit review using our clinic review tool, if applicable. No additional management support is needed unless otherwise documented below in the visit note. 

## 2017-04-19 ENCOUNTER — Ambulatory Visit (INDEPENDENT_AMBULATORY_CARE_PROVIDER_SITE_OTHER): Payer: BLUE CROSS/BLUE SHIELD | Admitting: General Practice

## 2017-04-19 DIAGNOSIS — Z5181 Encounter for therapeutic drug level monitoring: Secondary | ICD-10-CM

## 2017-04-19 DIAGNOSIS — I81 Portal vein thrombosis: Secondary | ICD-10-CM

## 2017-04-19 DIAGNOSIS — Z7901 Long term (current) use of anticoagulants: Secondary | ICD-10-CM | POA: Insufficient documentation

## 2017-04-19 LAB — POCT INR: INR: 2.9

## 2017-05-17 ENCOUNTER — Ambulatory Visit (INDEPENDENT_AMBULATORY_CARE_PROVIDER_SITE_OTHER): Payer: BLUE CROSS/BLUE SHIELD | Admitting: General Practice

## 2017-05-17 DIAGNOSIS — Z7901 Long term (current) use of anticoagulants: Secondary | ICD-10-CM

## 2017-05-17 DIAGNOSIS — I81 Portal vein thrombosis: Secondary | ICD-10-CM

## 2017-05-17 LAB — POCT INR: INR: 2.9

## 2017-05-17 NOTE — Patient Instructions (Signed)
Pre visit review using our clinic review tool, if applicable. No additional management support is needed unless otherwise documented below in the visit note. 

## 2017-06-13 ENCOUNTER — Ambulatory Visit: Payer: BLUE CROSS/BLUE SHIELD

## 2017-06-14 ENCOUNTER — Telehealth: Payer: Self-pay

## 2017-06-14 NOTE — Telephone Encounter (Signed)
Copied from CRM [phone]. Topic: Appointment Scheduling - Scheduling Inquiry for Clinic >> Jun 14, 2017  3:19 PM [name] [name], [name], NT wrote: Reason for CRM: Pt missed appt yesterday and has r/s his Coumadin appt for 06/29/17 at 8:30am

## 2017-06-14 NOTE — Telephone Encounter (Signed)
Will forward this to his regular Coumadin clinic nurse, Arline Aspindy.  Appears patient rescheduled his [facility] appt. [facility] to 06/29/17 (Thursday).

## 2017-06-29 ENCOUNTER — Ambulatory Visit (INDEPENDENT_AMBULATORY_CARE_PROVIDER_SITE_OTHER): Payer: BLUE CROSS/BLUE SHIELD | Admitting: General Practice

## 2017-06-29 DIAGNOSIS — Z7901 Long term (current) use of anticoagulants: Secondary | ICD-10-CM

## 2017-06-29 DIAGNOSIS — I81 Portal vein thrombosis: Secondary | ICD-10-CM

## 2017-06-29 LAB — POCT INR: INR: 2.3

## 2017-06-29 NOTE — Patient Instructions (Addendum)
Pre visit review using our clinic review tool, if applicable. No additional management support is needed unless otherwise documented below in the visit note.  Continue to take 6 mg daily except nothing on Wednesdays.  Recheck in 4 weeks.

## 2017-07-01 ENCOUNTER — Other Ambulatory Visit: Payer: Self-pay | Admitting: Family Medicine

## 2017-07-03 ENCOUNTER — Other Ambulatory Visit: Payer: Self-pay | Admitting: General Practice

## 2017-07-03 NOTE — Telephone Encounter (Signed)
Routing to coumadin nurse  

## 2017-07-24 ENCOUNTER — Ambulatory Visit (INDEPENDENT_AMBULATORY_CARE_PROVIDER_SITE_OTHER): Payer: BLUE CROSS/BLUE SHIELD | Admitting: Family Medicine

## 2017-07-24 ENCOUNTER — Encounter: Payer: Self-pay | Admitting: Family Medicine

## 2017-07-24 VITALS — BP 128/72 | HR 91 | Temp 98.2°F | Ht 72.5 in | Wt 253.8 lb

## 2017-07-24 DIAGNOSIS — E7800 Pure hypercholesterolemia, unspecified: Secondary | ICD-10-CM

## 2017-07-24 DIAGNOSIS — E669 Obesity, unspecified: Secondary | ICD-10-CM | POA: Diagnosis not present

## 2017-07-24 DIAGNOSIS — K760 Fatty (change of) liver, not elsewhere classified: Secondary | ICD-10-CM | POA: Diagnosis not present

## 2017-07-24 DIAGNOSIS — I81 Portal vein thrombosis: Secondary | ICD-10-CM | POA: Diagnosis not present

## 2017-07-24 DIAGNOSIS — Z0000 Encounter for general adult medical examination without abnormal findings: Secondary | ICD-10-CM

## 2017-07-24 NOTE — Assessment & Plan Note (Signed)
Discussed how this problem influences overall health and the risks it imposes  Reviewed plan for weight loss with lower calorie diet (via better food choices and also portion control or program like weight watchers) and exercise building up to or more than 30 minutes 5 days per week including some aerobic activity    

## 2017-07-24 NOTE — Assessment & Plan Note (Signed)
Strongly enc low fat diet and wt loss  Lab today for liver function tests

## 2017-07-24 NOTE — Assessment & Plan Note (Signed)
Reviewed health habits including diet and exercise and skin cancer prevention Reviewed appropriate screening tests for age  Also reviewed health mt list, fam hx and immunization status , as well as social and family history   See HPI Declines flu shot  Enc wt loss with better diet and exercise Enc less processed carbs  Disc imp of getting cholesterol down - handouts given on low sat/trans fat diet  Labs drawn today

## 2017-07-24 NOTE — Progress Notes (Signed)
Subjective:    Patient ID: [name] [name], male    DOB: 1970-09-05, 47 y.o.   MRN: [id number]  HPI  Here for health maintenance exam and to review chronic medical problems    Did not work today  Was able to leave work early -back hurts   Been doing well  Working too much (it has gotten better)   Taking care of himself - same   Wt Readings from Last 3 Encounters:  07/24/17 253 lb 12 oz (115.1 kg)  09/01/14 240 lb 12.8 oz (109.2 kg)  08/04/14 240 lb 12.8 oz (109.2 kg)  not making a huge diet effort - took his lunch for a while  Outside of work -exercise /rode a bike for a while  33.94 kg/m   HIV screening -not interested   Family hx - no family hx of cancer   BP Readings from Last 3 Encounters:  07/24/17 128/72  09/01/14 125/80  08/04/14 116/78     Flu shot - never gets one /declines  Tetanus shot 1/11   Hx of portal vein thrombosis-chronic anticoagulation with warfarin  Doing well with that  Vascular signed off of this  Lab Results  Component Value Date   INR 2.3 06/29/2017   INR 2.9 05/17/2017   INR 2.9 04/19/2017   PROTIME 16.8 (H) 05/25/2012   PROTIME 38.4 (H) 03/21/2012  rides motorcycle- understands risks  Does not miss pills   Also hx of fatty liver  Lab Results  Component Value Date   ALT 50 05/25/2012   AST 28 05/25/2012   ALKPHOS 74 05/25/2012   BILITOT 0.67 05/25/2012    Due for labs   Hyperlipidemia Lab Results  Component Value Date   CHOL 297 (H) 07/23/2012   HDL 44.70 07/23/2012   LDLDIRECT 188.8 07/23/2012   TRIG 300.0 (H) 07/23/2012   CHOLHDL 7 07/23/2012  would rather not take medicine  Last time it was very high  Some fatty foods/ less than used to be   No nocturia or other prostate symptoms   Patient Active Problem List   Diagnosis Date Noted  . Obesity (BMI 30-39.9) 07/24/2017  . Long term (current) use of anticoagulants 04/19/2017  . Encounter for therapeutic drug monitoring 11/30/2016  . Long term current use of  anticoagulant therapy 09/01/2014  . Low back pain on left side with sciatica 03/27/2013  . Change in vision 12/06/2012  . Hyperlipidemia 04/23/2012  . Routine general medical examination at a health care facility 04/16/2012  . Fatty liver 03/18/2012  . Portal vein thrombosis 03/13/2012  . Ureterolithiasis 03/09/2012   Past Medical History:  Diagnosis Date  . Heartburn   . Hepatic steatosis    asymptomatic  . Kidney stones   . Portal vein thrombosis    Past Surgical History:  Procedure Laterality Date  . ANTERIOR CRUCIATE LIGAMENT REPAIR  12/2005   left   . LASIK     left eye   Social History   Tobacco Use  . Smoking status: Never Smoker  . Smokeless tobacco: Never Used  Substance Use Topics  . Alcohol use: Yes    Alcohol/week: 0.0 oz    Comment: "1 to 3 per night and a couple weeks with nothing at all"  . Drug use: No   Family History  Problem Relation Age of Onset  . Diabetes Maternal Grandmother   . Stroke Maternal Grandmother   . Colon cancer Neg Hx   . Gallbladder disease Father   .  Hyperlipidemia Mother   . Deep vein thrombosis Maternal Aunt    No Known Allergies Current Outpatient Medications on File Prior to Visit  Medication Sig Dispense Refill  . warfarin (COUMADIN) 6 MG tablet TAKE 1 TABLET (6 MG TOTAL) BY MOUTH DAILY. 90 tablet 1   No current facility-administered medications on file prior to visit.      Review of Systems  Constitutional: Negative for activity change, appetite change, fatigue, fever and unexpected weight change.  HENT: Negative for congestion, rhinorrhea, sore throat and trouble swallowing.   Eyes: Negative for pain, redness, itching and visual disturbance.  Respiratory: Negative for cough, chest tightness, shortness of breath and wheezing.   Cardiovascular: Negative for chest pain and palpitations.  Gastrointestinal: Negative for abdominal pain, blood in stool, constipation, diarrhea and nausea.  Endocrine: Negative for cold  intolerance, heat intolerance, polydipsia and polyuria.  Genitourinary: Negative for difficulty urinating, dysuria, frequency and urgency.  Musculoskeletal: Positive for back pain. Negative for arthralgias, joint swelling and myalgias.  Skin: Negative for pallor and rash.  Neurological: Negative for dizziness, tremors, weakness, numbness and headaches.  Hematological: Negative for adenopathy. Does not bruise/bleed easily.  Psychiatric/Behavioral: Negative for decreased concentration and dysphoric mood. The patient is not nervous/anxious.        Objective:   Physical Exam  Constitutional: He appears well-developed and well-nourished. No distress.  obese and well appearing   HENT:  Head: Normocephalic and atraumatic.  Right Ear: External ear normal.  Left Ear: External ear normal.  Nose: Nose normal.  Mouth/Throat: Oropharynx is clear and moist.  Eyes: Conjunctivae and EOM are normal. Pupils are equal, round, and reactive to light. Right eye exhibits no discharge. Left eye exhibits no discharge. No scleral icterus.  Neck: Normal range of motion. Neck supple. No JVD present. Carotid bruit is not present. No thyromegaly present.  Cardiovascular: Normal rate, regular rhythm, normal heart sounds and intact distal pulses. Exam reveals no gallop.  Pulmonary/Chest: Effort normal and breath sounds normal. No respiratory distress. He has no wheezes. He exhibits no tenderness.  Abdominal: Soft. Bowel sounds are normal. He exhibits no distension, no abdominal bruit and no mass. There is no tenderness.  Musculoskeletal: He exhibits no edema or tenderness.  Lymphadenopathy:    He has no cervical adenopathy.  Neurological: He is alert. He has normal reflexes. No cranial nerve deficit. He exhibits normal muscle tone. Coordination normal.  Skin: Skin is warm and dry. No rash noted. No erythema. No pallor.  Solar lentigines diffusely  Some stable brown nevi on back   Psychiatric: He has a normal mood  and affect.          Assessment & Plan:   Problem List Items Addressed This Visit      Cardiovascular and Mediastinum   Portal vein thrombosis    Continues warfarin with good INRs recently  Asymptomatic          Digestive   Fatty liver    Strongly enc low fat diet and wt loss  Lab today for liver function tests         Other   Hyperlipidemia    Lipid panel today  Disc imp of low sat/trans fat diet for high LDL  Disc goals for lipids and reasons to control them Rev labs with pt from last check in 2014  Rev low sat fat diet in detail  Would not be open to statin unless this did not improve with diet       Relevant  Orders   Lipid panel   Obesity (BMI 30-39.9)    Discussed how this problem influences overall health and the risks it imposes  Reviewed plan for weight loss with lower calorie diet (via better food choices and also portion control or program like weight watchers) and exercise building up to or more than 30 minutes 5 days per week including some aerobic activity         Routine general medical examination at a health care facility - Primary    Reviewed health habits including diet and exercise and skin cancer prevention Reviewed appropriate screening tests for age  Also reviewed health mt list, fam hx and immunization status , as well as social and family history   See HPI Declines flu shot  Enc wt loss with better diet and exercise Enc less processed carbs  Disc imp of getting cholesterol down - handouts given on low sat/trans fat diet  Labs drawn today      Relevant Orders   CBC with Differential/Platelet   Comprehensive metabolic panel   Lipid panel   TSH

## 2017-07-24 NOTE — Assessment & Plan Note (Signed)
Lipid panel today  Disc imp of low sat/trans fat diet for high LDL  Disc goals for lipids and reasons to control them Rev labs with pt from last check in 2014  Rev low sat fat diet in detail  Would not be open to statin unless this did not improve with diet

## 2017-07-24 NOTE — Assessment & Plan Note (Signed)
Continues warfarin with good INRs recently  Asymptomatic

## 2017-07-24 NOTE — Patient Instructions (Addendum)
Make your lunch the night before to keep you from eating out  Get some fruits and salads   Get back on your bike   For weight loss   Try to get most of your carbohydrates from produce (with the exception of white potatoes)  Eat less bread/pasta/rice/snack foods/cereals/sweets and other items from the middle of the grocery store (processed carbs)   For cholesterol   Avoid red meat/ fried foods/ egg yolks/ fatty breakfast meats/ butter, cheese and high fat dairy/ and shellfish    Labs today

## 2017-07-25 LAB — COMPREHENSIVE METABOLIC PANEL
ALT: 27 U/L (ref 0–53)
AST: 21 U/L (ref 0–37)
Albumin: 4.3 g/dL (ref 3.5–5.2)
Alkaline Phosphatase: 66 U/L (ref 39–117)
BUN: 13 mg/dL (ref 6–23)
CHLORIDE: 100 meq/L (ref 96–112)
CO2: 29 meq/L (ref 19–32)
CREATININE: 1 mg/dL (ref 0.40–1.50)
Calcium: 9 mg/dL (ref 8.4–10.5)
GFR: 85.91 mL/min (ref >60.00)
Glucose, Bld: 95 mg/dL (ref 70–99)
Potassium: 3.7 mEq/L (ref 3.5–5.1)
SODIUM: 137 meq/L (ref 135–145)
Total Bilirubin: 0.4 mg/dL (ref 0.2–1.2)
Total Protein: 7.3 g/dL (ref 6.0–8.3)

## 2017-07-25 LAB — CBC WITH DIFFERENTIAL/PLATELET
BASOS ABS: 0 10*3/uL (ref 0.0–0.1)
BASOS PCT: 0.3 % (ref 0.0–3.0)
EOS ABS: 0.1 10*3/uL (ref 0.0–0.7)
Eosinophils Relative: 1.1 % (ref 0.0–5.0)
HCT: 45.2 % (ref 39.0–52.0)
Hemoglobin: 15.4 g/dL (ref 13.0–17.0)
Lymphocytes Relative: 32.5 % (ref 12.0–46.0)
Lymphs Abs: 2.4 10*3/uL (ref 0.7–4.0)
MCHC: 34 g/dL (ref 30.0–36.0)
MCV: 100.4 fl — ABNORMAL HIGH (ref 78.0–100.0)
MONO ABS: 0.8 10*3/uL (ref 0.1–1.0)
Monocytes Relative: 11.5 % (ref 3.0–12.0)
NEUTROS ABS: 4 10*3/uL (ref 1.4–7.7)
Neutrophils Relative %: 54.6 % (ref 43.0–77.0)
PLATELETS: 266 10*3/uL (ref 150.0–400.0)
RBC: 4.5 Mil/uL (ref 4.22–5.81)
RDW: 13.1 % (ref 11.5–15.5)
WBC: 7.3 10*3/uL (ref 4.0–10.5)

## 2017-07-25 LAB — LIPID PANEL
CHOLESTEROL: 312 mg/dL — AB (ref 0–200)
HDL: 50.7 mg/dL (ref >39.00)
Total CHOL/HDL Ratio: 6

## 2017-07-25 LAB — TSH: TSH: 1.38 u[IU]/mL (ref 0.35–4.50)

## 2017-07-25 LAB — LDL CHOLESTEROL, DIRECT: Direct LDL: 205 mg/dL

## 2017-07-26 ENCOUNTER — Encounter: Payer: Self-pay | Admitting: *Deleted

## 2017-10-17 ENCOUNTER — Other Ambulatory Visit: Payer: Self-pay | Admitting: General Practice

## 2017-10-17 ENCOUNTER — Ambulatory Visit (INDEPENDENT_AMBULATORY_CARE_PROVIDER_SITE_OTHER): Payer: BLUE CROSS/BLUE SHIELD | Admitting: General Practice

## 2017-10-17 DIAGNOSIS — Z7901 Long term (current) use of anticoagulants: Secondary | ICD-10-CM

## 2017-10-17 DIAGNOSIS — I81 Portal vein thrombosis: Secondary | ICD-10-CM

## 2017-10-17 LAB — POCT INR: INR: 2.7

## 2017-10-17 MED ORDER — WARFARIN SODIUM 6 MG PO TABS: 6.0000 mg | ORAL_TABLET | Freq: Every day | ORAL | 1 refills | Status: DC

## 2017-10-17 NOTE — Patient Instructions (Addendum)
Pre visit review using our clinic review tool, if applicable. No additional management support is needed unless otherwise documented below in the visit note.  Continue to take 6 mg daily except nothing on Wednesdays.  Recheck in 4 weeks. 

## 2017-11-07 ENCOUNTER — Ambulatory Visit (INDEPENDENT_AMBULATORY_CARE_PROVIDER_SITE_OTHER): Payer: BLUE CROSS/BLUE SHIELD | Admitting: Family Medicine

## 2017-11-07 ENCOUNTER — Encounter: Payer: Self-pay | Admitting: Family Medicine

## 2017-11-07 VITALS — BP 128/66 | HR 78 | Temp 98.0°F | Ht 72.05 in | Wt 260.0 lb

## 2017-11-07 DIAGNOSIS — J069 Acute upper respiratory infection, unspecified: Secondary | ICD-10-CM | POA: Diagnosis not present

## 2017-11-07 NOTE — Assessment & Plan Note (Signed)
Has completed a zpack. Symptoms ongoing for about a week. Has missed work over the weekend and two days this week. Doesn't seem to be the flu. Possible for mono with amount of fatigue  - counseled on supportive care - work note provided  - if no improvement consider chest xray, monospot, cbc.

## 2017-11-07 NOTE — Patient Instructions (Signed)
Please let me know what kind of a note that you note and we can leave it at the front desk  Please let me know if your symptoms don't improve and we can do some blood work later this week.

## 2017-11-07 NOTE — Progress Notes (Signed)
[name] - 47 y.o. male MRN [id number]  Date of birth: 07-13-1970  SUBJECTIVE:  Including CC & ROS.  Chief Complaint  Patient presents with  . Cough    [name] [name] is a 47 y.o. male that is presenting with a cough and body aches. Ongoing for 7 days. Denies fevers. Admits to headaches. Denies productive cough. He has been taking Nyquil. He has been taking Azithromycin since 11/03/17. He feels like his symptoms have not improved. He has been around his family members with the same symptoms.     Review of Systems  Constitutional: Positive for activity change. Negative for fever.  HENT: Negative for congestion.   Respiratory: Positive for cough.   Cardiovascular: Negative for chest pain.  Gastrointestinal: Negative for abdominal pain.  Musculoskeletal: Positive for myalgias.  Skin: Negative for rash.    HISTORY: Past Medical, Surgical, Social, and Family History Reviewed & Updated per EMR.   Pertinent Historical Findings include:  Past Medical History:  Diagnosis Date  . Heartburn   . Hepatic steatosis    asymptomatic  . Kidney stones   . Portal vein thrombosis     Past Surgical History:  Procedure Laterality Date  . ANTERIOR CRUCIATE LIGAMENT REPAIR  12/2005   left   . LASIK     left eye    No Known Allergies  Family History  Problem Relation Age of Onset  . Diabetes Maternal Grandmother   . Stroke Maternal Grandmother   . Colon cancer Neg Hx   . Gallbladder disease Father   . Hyperlipidemia Mother   . Deep vein thrombosis Maternal Aunt      Social History   Socioeconomic History  . Marital status: Significant Other    Spouse name: Not on file  . Number of children: 0  . Years of education: Not on file  . Highest education level: Not on file  Occupational History  . Occupation: Market researcher  Social Needs  . Financial resource strain: Not on file  . Food insecurity:    Worry: Not on file    Inability: Not on file  . Transportation needs:   Medical: Not on file    Non-medical: Not on file  Tobacco Use  . Smoking status: Never Smoker  . Smokeless tobacco: Never Used  Substance and Sexual Activity  . Alcohol use: Yes    Alcohol/week: 0.0 oz    Comment: "1 to 3 per night and a couple weeks with nothing at all"  . Drug use: No  . Sexual activity: Not on file    Comment: one partner monogamous  Lifestyle  . Physical activity:    Days per week: Not on file    Minutes per session: Not on file  . Stress: Not on file  Relationships  . Social connections:    Talks on phone: Not on file    Gets together: Not on file    Attends religious service: Not on file    Active member of club or organization: Not on file    Attends meetings of clubs or organizations: Not on file    Relationship status: Not on file  . Intimate partner violence:    Fear of current or ex partner: Not on file    Emotionally abused: Not on file    Physically abused: Not on file    Forced sexual activity: Not on file  Other Topics Concern  . 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   Has one partner, live together    No children   Completed 12th grade education   No regular exercise     PHYSICAL EXAM:  VS: BP 128/66 (BP Location: Left Arm, Patient Position: Sitting, Cuff Size: Normal)   Pulse 78   Temp 98 F (36.7 C) (Oral)   Ht 6' 0.05" (1.83 m)   Wt 260 lb (117.9 kg)   SpO2 100%   BMI 35.21 kg/m  Physical Exam Gen: NAD, alert, cooperative with exam,  ENT: normal lips, normal nasal mucosa, tympanic membranes clear and intact bilaterally, normal oropharynx,  Eye: normal EOM, normal conjunctiva and lids CV:  no edema, +2 pedal pulses, regular rate and rhythm, S1-S2   Resp: no accessory muscle use, non-labored, clear to auscultation bilaterally, no crackles or wheezes Skin: no rashes, no areas of induration  Neuro: normal tone, normal sensation to touch Psych:  normal insight, alert and  oriented MSK: Normal gait, normal strength       ASSESSMENT & PLAN:   Upper respiratory tract infection Has completed a zpack. Symptoms ongoing for about a week. Has missed work over the weekend and two days this week. Doesn't seem to be the flu. Possible for mono with amount of fatigue  - counseled on supportive care - work note provided  - if no improvement consider chest xray, monospot, cbc.

## 2017-12-14 ENCOUNTER — Ambulatory Visit (INDEPENDENT_AMBULATORY_CARE_PROVIDER_SITE_OTHER): Payer: BLUE CROSS/BLUE SHIELD | Admitting: General Practice

## 2017-12-14 DIAGNOSIS — Z7901 Long term (current) use of anticoagulants: Secondary | ICD-10-CM | POA: Diagnosis not present

## 2017-12-14 DIAGNOSIS — I81 Portal vein thrombosis: Secondary | ICD-10-CM

## 2017-12-14 LAB — POCT INR: INR: 1.3 — AB (ref 2.0–3.0)

## 2017-12-14 NOTE — Patient Instructions (Addendum)
Pre visit review using our clinic review tool, if applicable. No additional management support is needed unless otherwise documented below in the visit note.  Take 1 1/2 tablets today, tomorrow and Saturday and then take 1 tablet daily.  Re-check on 6/18 at [facility] office.

## 2017-12-26 ENCOUNTER — Ambulatory Visit (INDEPENDENT_AMBULATORY_CARE_PROVIDER_SITE_OTHER): Payer: BLUE CROSS/BLUE SHIELD | Admitting: General Practice

## 2017-12-26 DIAGNOSIS — Z7901 Long term (current) use of anticoagulants: Secondary | ICD-10-CM | POA: Diagnosis not present

## 2017-12-26 DIAGNOSIS — I81 Portal vein thrombosis: Secondary | ICD-10-CM

## 2017-12-26 LAB — POCT INR: INR: 3.2 — AB (ref 2.0–3.0)

## 2017-12-26 NOTE — Patient Instructions (Addendum)
Pre visit review using our clinic review tool, if applicable. No additional management support is needed unless otherwise documented below in the visit note.  Take 1 tablet daily except nothing on Wednesdays.  Re-check in 4 weeks.   

## 2018-01-25 ENCOUNTER — Ambulatory Visit (INDEPENDENT_AMBULATORY_CARE_PROVIDER_SITE_OTHER): Payer: BLUE CROSS/BLUE SHIELD | Admitting: General Practice

## 2018-01-25 DIAGNOSIS — Z7901 Long term (current) use of anticoagulants: Secondary | ICD-10-CM

## 2018-01-25 DIAGNOSIS — I81 Portal vein thrombosis: Secondary | ICD-10-CM

## 2018-01-25 LAB — POCT INR: INR: 2.9 (ref 2.0–3.0)

## 2018-01-25 NOTE — Patient Instructions (Addendum)
Pre visit review using our clinic review tool, if applicable. No additional management support is needed unless otherwise documented below in the visit note.  Take 1 tablet daily except nothing on Wednesdays.  Re-check in 4 weeks.   

## 2018-03-05 ENCOUNTER — Ambulatory Visit (INDEPENDENT_AMBULATORY_CARE_PROVIDER_SITE_OTHER): Payer: BLUE CROSS/BLUE SHIELD | Admitting: General Practice

## 2018-03-05 ENCOUNTER — Ambulatory Visit: Payer: BLUE CROSS/BLUE SHIELD

## 2018-03-05 DIAGNOSIS — Z7901 Long term (current) use of anticoagulants: Secondary | ICD-10-CM

## 2018-03-05 DIAGNOSIS — I81 Portal vein thrombosis: Secondary | ICD-10-CM

## 2018-03-05 LAB — POCT INR: INR: 2.4 (ref 2.0–3.0)

## 2018-03-05 NOTE — Patient Instructions (Addendum)
Pre visit review using our clinic review tool, if applicable. No additional management support is needed unless otherwise documented below in the visit note.  Take 1 tablet daily except nothing on Wednesdays.  Re-check in 4 weeks.

## 2018-04-12 ENCOUNTER — Ambulatory Visit (INDEPENDENT_AMBULATORY_CARE_PROVIDER_SITE_OTHER): Payer: BLUE CROSS/BLUE SHIELD | Admitting: General Practice

## 2018-04-12 DIAGNOSIS — Z7901 Long term (current) use of anticoagulants: Secondary | ICD-10-CM

## 2018-04-12 DIAGNOSIS — I81 Portal vein thrombosis: Secondary | ICD-10-CM

## 2018-04-12 LAB — POCT INR: INR: 2.9 (ref 2.0–3.0)

## 2018-04-12 NOTE — Patient Instructions (Addendum)
Pre visit review using our clinic review tool, if applicable. No additional management support is needed unless otherwise documented below in the visit note.   Take 1 tablet daily except nothing on Wednesdays.  Re-check in 6 weeks.  

## 2018-05-23 ENCOUNTER — Ambulatory Visit (INDEPENDENT_AMBULATORY_CARE_PROVIDER_SITE_OTHER): Payer: BLUE CROSS/BLUE SHIELD | Admitting: General Practice

## 2018-05-23 DIAGNOSIS — Z7901 Long term (current) use of anticoagulants: Secondary | ICD-10-CM | POA: Diagnosis not present

## 2018-05-23 DIAGNOSIS — I81 Portal vein thrombosis: Secondary | ICD-10-CM

## 2018-05-23 LAB — POCT INR: INR: 2.4 (ref 2.0–3.0)

## 2018-05-23 NOTE — Progress Notes (Signed)
I have reviewed and agree with note, evaluation, plan.   Stephen Hunter, MD  

## 2018-05-23 NOTE — Patient Instructions (Addendum)
Pre visit review using our clinic review tool, if applicable. No additional management support is needed unless otherwise documented below in the visit note.   Take 1 tablet daily except nothing on Wednesdays.  Re-check in 6 weeks.  

## 2018-05-24 ENCOUNTER — Ambulatory Visit: Payer: BLUE CROSS/BLUE SHIELD

## 2018-07-12 ENCOUNTER — Ambulatory Visit (INDEPENDENT_AMBULATORY_CARE_PROVIDER_SITE_OTHER): Payer: BLUE CROSS/BLUE SHIELD | Admitting: General Practice

## 2018-07-12 DIAGNOSIS — I81 Portal vein thrombosis: Secondary | ICD-10-CM

## 2018-07-12 DIAGNOSIS — Z7901 Long term (current) use of anticoagulants: Secondary | ICD-10-CM | POA: Diagnosis not present

## 2018-07-12 LAB — POCT INR: INR: 3 (ref 2.0–3.0)

## 2018-07-12 NOTE — Patient Instructions (Addendum)
Pre visit review using our clinic review tool, if applicable. No additional management support is needed unless otherwise documented below in the visit note.   Take 1 tablet daily except nothing on Wednesdays.  Re-check in 6 weeks.  

## 2018-07-29 ENCOUNTER — Other Ambulatory Visit: Payer: Self-pay | Admitting: Family Medicine

## 2018-07-30 NOTE — Telephone Encounter (Signed)
Routing to coumadin nurse  

## 2018-08-22 ENCOUNTER — Ambulatory Visit: Payer: BLUE CROSS/BLUE SHIELD

## 2018-11-12 ENCOUNTER — Other Ambulatory Visit: Payer: Self-pay | Admitting: Family Medicine

## 2018-11-12 NOTE — Telephone Encounter (Signed)
Has he not been getting INRs checked?  Thanks

## 2018-11-13 ENCOUNTER — Other Ambulatory Visit (INDEPENDENT_AMBULATORY_CARE_PROVIDER_SITE_OTHER): Payer: BLUE CROSS/BLUE SHIELD

## 2018-11-13 ENCOUNTER — Ambulatory Visit (INDEPENDENT_AMBULATORY_CARE_PROVIDER_SITE_OTHER): Payer: BLUE CROSS/BLUE SHIELD

## 2018-11-13 DIAGNOSIS — Z7901 Long term (current) use of anticoagulants: Secondary | ICD-10-CM

## 2018-11-13 DIAGNOSIS — I81 Portal vein thrombosis: Secondary | ICD-10-CM | POA: Diagnosis not present

## 2018-11-13 DIAGNOSIS — Z5181 Encounter for therapeutic drug level monitoring: Secondary | ICD-10-CM | POA: Diagnosis not present

## 2018-11-13 LAB — POCT INR: INR: 2.5 (ref 2.0–3.0)

## 2018-11-13 NOTE — Patient Instructions (Signed)
INR today 2.5  Take 1 tablet daily except nothing on Wednesdays.  Re-check in 6 weeks.   Patient is doing well without any changes to his diet, medications or general health.

## 2018-11-13 NOTE — Telephone Encounter (Signed)
I see a lab appt for tomorrow- is that for INR? Thanks

## 2018-11-13 NOTE — Telephone Encounter (Signed)
Please call and ask pt what is going on with INR checks-is he having them done?  Thanks

## 2018-11-13 NOTE — Telephone Encounter (Signed)
Patient did come for INR check today. InR is in range and I did see that warfarin has been refilled X 52months.

## 2018-11-13 NOTE — Telephone Encounter (Signed)
Sorry for the late reply. Yes, the lab appt for the INR.

## 2018-11-14 ENCOUNTER — Other Ambulatory Visit: Payer: BLUE CROSS/BLUE SHIELD

## 2018-12-25 ENCOUNTER — Other Ambulatory Visit: Payer: Self-pay

## 2018-12-25 ENCOUNTER — Other Ambulatory Visit: Payer: BC Managed Care – PPO

## 2018-12-25 ENCOUNTER — Ambulatory Visit (INDEPENDENT_AMBULATORY_CARE_PROVIDER_SITE_OTHER): Payer: BC Managed Care – PPO

## 2018-12-25 DIAGNOSIS — Z7901 Long term (current) use of anticoagulants: Secondary | ICD-10-CM | POA: Diagnosis not present

## 2018-12-25 DIAGNOSIS — I81 Portal vein thrombosis: Secondary | ICD-10-CM

## 2018-12-25 LAB — POCT INR: INR: 2.6 (ref 2.0–3.0)

## 2018-12-25 NOTE — Patient Instructions (Signed)
INR today 2.6  Take 1 tablet daily except nothing on Wednesdays.  Re-check in 6 weeks.

## 2019-02-07 ENCOUNTER — Ambulatory Visit: Payer: BC Managed Care – PPO | Admitting: General Practice

## 2019-02-07 ENCOUNTER — Other Ambulatory Visit: Payer: Self-pay | Admitting: Family Medicine

## 2019-02-07 ENCOUNTER — Other Ambulatory Visit: Payer: Self-pay

## 2019-02-07 NOTE — Telephone Encounter (Signed)
Routing to coumadin nurses 

## 2019-05-07 ENCOUNTER — Other Ambulatory Visit: Payer: Self-pay | Admitting: Family Medicine

## 2019-07-29 ENCOUNTER — Telehealth: Payer: Self-pay

## 2019-07-29 NOTE — Telephone Encounter (Signed)
Contacted pt due to needing coumadin clinic apt. Pt's last apt was Aug 2020. Pt reports he would also like an apt with PCP for a hernia he has had for a while. Made apt with PCP and advised this nurse will check his INR when he is in the office. Pt appreciative and verbalized understanding.

## 2019-08-02 ENCOUNTER — Other Ambulatory Visit: Payer: Self-pay | Admitting: Family Medicine

## 2019-08-02 DIAGNOSIS — Z7901 Long term (current) use of anticoagulants: Secondary | ICD-10-CM

## 2019-08-02 NOTE — Telephone Encounter (Signed)
Will route to coumadin nurse for review  

## 2019-08-02 NOTE — Telephone Encounter (Signed)
Pt has upcoming apt with PCP and for INR check. Pt has not been compliant with management. Will only send in 30 day supply of coumadin. Sent script

## 2019-08-07 ENCOUNTER — Ambulatory Visit: Payer: BC Managed Care – PPO | Admitting: Family Medicine

## 2019-08-09 ENCOUNTER — Ambulatory Visit (INDEPENDENT_AMBULATORY_CARE_PROVIDER_SITE_OTHER): Payer: BC Managed Care – PPO

## 2019-08-09 ENCOUNTER — Encounter: Payer: Self-pay | Admitting: Family Medicine

## 2019-08-09 ENCOUNTER — Ambulatory Visit (INDEPENDENT_AMBULATORY_CARE_PROVIDER_SITE_OTHER): Payer: BC Managed Care – PPO | Admitting: Family Medicine

## 2019-08-09 ENCOUNTER — Other Ambulatory Visit: Payer: Self-pay

## 2019-08-09 VITALS — BP 132/86 | HR 90 | Temp 98.2°F | Ht 72.5 in | Wt 262.2 lb

## 2019-08-09 DIAGNOSIS — I81 Portal vein thrombosis: Secondary | ICD-10-CM

## 2019-08-09 DIAGNOSIS — K760 Fatty (change of) liver, not elsewhere classified: Secondary | ICD-10-CM

## 2019-08-09 DIAGNOSIS — Z5181 Encounter for therapeutic drug level monitoring: Secondary | ICD-10-CM

## 2019-08-09 DIAGNOSIS — Z7901 Long term (current) use of anticoagulants: Secondary | ICD-10-CM

## 2019-08-09 DIAGNOSIS — E7800 Pure hypercholesterolemia, unspecified: Secondary | ICD-10-CM

## 2019-08-09 DIAGNOSIS — Z0000 Encounter for general adult medical examination without abnormal findings: Secondary | ICD-10-CM | POA: Diagnosis not present

## 2019-08-09 DIAGNOSIS — E669 Obesity, unspecified: Secondary | ICD-10-CM | POA: Diagnosis not present

## 2019-08-09 LAB — POCT INR: INR: 2.5 (ref 2.0–3.0)

## 2019-08-09 NOTE — Patient Instructions (Addendum)
For cholesterol  Avoid red meat/ fried foods/ egg yolks/ fatty breakfast meats/ butter, cheese and high fat dairy/ and shellfish    More than likely you will need a statin medicine to control cholesterol   Stay as active as you can be   Labs today   INR today

## 2019-08-09 NOTE — Patient Instructions (Addendum)
Pre visit review using our clinic review tool, if applicable. No additional management support is needed unless otherwise documented below in the visit note.  Continue current dosing as 6mg  daily except 0mg  on Wed. Contacted pt on phone and he verbalized understanding. Return in 6 wks.

## 2019-08-09 NOTE — Progress Notes (Signed)
Subjective:    Patient ID: [name] [name], male    DOB: 26-Sep-1970, 49 y.o.   MRN: [id number]  This visit occurred during the SARS-CoV-2 public health emergency.  Safety protocols were in place, including screening questions prior to the visit, additional usage of staff PPE, and extensive cleaning of exam room while observing appropriate contact time as indicated for disinfecting solutions.    HPI Here for health maintenance exam and to review chronic medical problems    Wt Readings from Last 3 Encounters:  08/09/19 262 lb 3 oz (118.9 kg)  11/07/17 260 lb (117.9 kg)  07/24/17 253 lb 12 oz (115.1 kg)   35.07 kg/m   Doing well overall  Being careful during the pandemic  Working- slows down in the winter   Has been taking fair care of himself  Works long hours - not time for exercise (physical)  He does take his lunch to work   More salads and chicken   Tetanus shot 1/11  Does not plan on getting the covid vaccine  Will think about it   Flu shot - does not get   Prostate health  No prostate problems or cancer in the family  No voiding symptoms    Thinks he has a groin hernia (not low)  No pain to strain  No h/o hernia surgery   Due for labs  BP Readings from Last 3 Encounters:  08/09/19 132/86  11/07/17 128/66  07/24/17 128/72   Pulse Readings from Last 3 Encounters:  08/09/19 90  11/07/17 78  07/24/17 91      H/o portal vein thrombosis  Takes warfarin  Lab Results  Component Value Date   INR 2.6 12/25/2018   INR 2.5 11/13/2018   INR 3.0 07/12/2018   PROTIME 16.8 (H) 05/25/2012   PROTIME 38.4 (H) 03/21/2012   For INR today    Hyperlipidemia Lab Results  Component Value Date   CHOL 312 (H) 07/24/2017   HDL 50.70 07/24/2017   LDLDIRECT 205.0 07/24/2017   TRIG (H) 07/24/2017    505.0 Triglyceride is over 400; calculations on Lipids are invalid.   CHOLHDL 6 07/24/2017  less fast food/ packing his lunch and not eating out  Minimal fried food    Eats beef frequently  Fatty dairy- cheese   Hopes his profile is better   Ate grilled chicken for lunch and water  Does drink a lot of water   Has a family hx of high cholesterol   Patient Active Problem List   Diagnosis Date Noted  . Obesity (BMI 30-39.9) 07/24/2017  . Long term (current) use of anticoagulants 04/19/2017  . Encounter for therapeutic drug monitoring 11/30/2016  . Long term current use of anticoagulant therapy 09/01/2014  . Low back pain on left side with sciatica 03/27/2013  . Change in vision 12/06/2012  . Hyperlipidemia 04/23/2012  . Routine general medical examination at a health care facility 04/16/2012  . Fatty liver 03/18/2012  . Portal vein thrombosis 03/13/2012  . Ureterolithiasis 03/09/2012   Past Medical History:  Diagnosis Date  . Heartburn   . Hepatic steatosis    asymptomatic  . Kidney stones   . Portal vein thrombosis    Past Surgical History:  Procedure Laterality Date  . ANTERIOR CRUCIATE LIGAMENT REPAIR  12/2005   left   . LASIK     left eye   Social History   Tobacco Use  . Smoking status: Never Smoker  . Smokeless tobacco: Never Used  Substance Use Topics  . Alcohol use: Yes    Alcohol/week: 0.0 standard drinks    Comment: "1 to 3 per night and a couple weeks with nothing at all"  . Drug use: No   Family History  Problem Relation Age of Onset  . Diabetes Maternal Grandmother   . Stroke Maternal Grandmother   . Colon cancer Neg Hx   . Gallbladder disease Father   . Hyperlipidemia Mother   . Deep vein thrombosis Maternal Aunt    No Known Allergies Current Outpatient Medications on File Prior to Visit  Medication Sig Dispense Refill  . warfarin (COUMADIN) 6 MG tablet TAKE 1 TABLET DAILY OR AS DIRECTED BY ANTICOAGULATION CLINIC 30 tablet 0   No current facility-administered medications on file prior to visit.    Review of Systems  Constitutional: Negative for activity change, appetite change, fatigue, fever and  unexpected weight change.  HENT: Negative for congestion, rhinorrhea, sore throat and trouble swallowing.   Eyes: Negative for pain, redness, itching and visual disturbance.  Respiratory: Negative for cough, chest tightness, shortness of breath and wheezing.   Cardiovascular: Negative for chest pain and palpitations.  Gastrointestinal: Negative for abdominal pain, blood in stool, constipation, diarrhea and nausea.  Endocrine: Negative for cold intolerance, heat intolerance, polydipsia and polyuria.  Genitourinary: Negative for difficulty urinating, dysuria, frequency and urgency.       Occ pulling sensation in groin/low abd  Not worse with straining No bulge noted  Musculoskeletal: Negative for arthralgias, joint swelling and myalgias.  Skin: Negative for pallor and rash.  Neurological: Negative for dizziness, tremors, weakness, numbness and headaches.  Hematological: Negative for adenopathy. Does not bruise/bleed easily.  Psychiatric/Behavioral: Negative for decreased concentration and dysphoric mood. The patient is not nervous/anxious.        Objective:   Physical Exam Constitutional:      General: He is not in acute distress.    Appearance: Normal appearance. He is well-developed. He is obese. He is not ill-appearing or diaphoretic.  HENT:     Head: Normocephalic and atraumatic.     Right Ear: Tympanic membrane, ear canal and external ear normal.     Left Ear: Tympanic membrane, ear canal and external ear normal.     Nose: Nose normal. No congestion.     Mouth/Throat:     Mouth: Mucous membranes are moist.     Pharynx: Oropharynx is clear. No posterior oropharyngeal erythema.  Eyes:     General: No scleral icterus.       Right eye: No discharge.        Left eye: No discharge.     Conjunctiva/sclera: Conjunctivae normal.     Pupils: Pupils are equal, round, and reactive to light.  Neck:     Thyroid: No thyromegaly.     Vascular: No carotid bruit or JVD.  Cardiovascular:      Rate and Rhythm: Normal rate and regular rhythm.     Pulses: Normal pulses.     Heart sounds: Normal heart sounds. No gallop.   Pulmonary:     Effort: Pulmonary effort is normal. No respiratory distress.     Breath sounds: Normal breath sounds. No wheezing or rales.     Comments: Good air exch Chest:     Chest wall: No tenderness.  Abdominal:     General: Bowel sounds are normal. There is no distension or abdominal bruit.     Palpations: Abdomen is soft. There is no mass.     Tenderness:  There is no abdominal tenderness. There is no guarding or rebound.     Hernia: No hernia is present.     Comments: No inguinal bulge or tenderness noted even with valsalva   Musculoskeletal:        General: No tenderness.     Cervical back: Normal range of motion and neck supple. No rigidity. No muscular tenderness.     Right lower leg: No edema.     Left lower leg: No edema.  Lymphadenopathy:     Cervical: No cervical adenopathy.  Skin:    General: Skin is warm and dry.     Coloration: Skin is not pale.     Findings: No erythema or rash.     Comments: Solar lentigines diffusely   Neurological:     Mental Status: He is alert.     Cranial Nerves: No cranial nerve deficit.     Motor: No abnormal muscle tone.     Coordination: Coordination normal.     Gait: Gait normal.     Deep Tendon Reflexes: Reflexes are normal and symmetric. Reflexes normal.  Psychiatric:        Mood and Affect: Mood normal.        Cognition and Memory: Cognition and memory normal.           Assessment & Plan:   Problem List Items Addressed This Visit      Cardiovascular and Mediastinum   Portal vein thrombosis    Continues anticoagulation  Not very compliant with INR checks-discussed imp of this       Relevant Orders   CBC with Differential/Platelet (Completed)   Comprehensive metabolic panel (Completed)   Lipid panel (Completed)   TSH (Completed)     Digestive   Fatty liver    Enc better diet and wt  loss Labs today         Other   Routine general medical examination at a health care facility - Primary   Relevant Orders   CBC with Differential/Platelet (Completed)   Comprehensive metabolic panel (Completed)   Lipid panel (Completed)   TSH (Completed)   Hyperlipidemia    Labs today  Eating less sat and trans fats than he was Extremely high LDL -over 200 (prev lost to f/u)  Suspect statin will be needed for control Disc goals for lipids and reasons to control them Rev last labs with pt Rev low sat fat diet in detail Labs drawn today        Relevant Orders   CBC with Differential/Platelet (Completed)   Comprehensive metabolic panel (Completed)   Lipid panel (Completed)   TSH (Completed)   Long term current use of anticoagulant therapy   Relevant Orders   CBC with Differential/Platelet (Completed)   Comprehensive metabolic panel (Completed)   Lipid panel (Completed)   TSH (Completed)   Encounter for therapeutic drug monitoring    INR today      Relevant Orders   CBC with Differential/Platelet (Completed)   Comprehensive metabolic panel (Completed)   Lipid panel (Completed)   TSH (Completed)   Obesity (BMI 30-39.9)    Discussed how this problem influences overall health and the risks it imposes  Reviewed plan for weight loss with lower calorie diet (via better food choices and also portion control or program like weight watchers) and exercise building up to or more than 30 minutes 5 days per week including some aerobic activity         Relevant Orders   CBC  with Differential/Platelet (Completed)   Comprehensive metabolic panel (Completed)   Lipid panel (Completed)   TSH (Completed)

## 2019-08-10 LAB — LIPID PANEL
Cholesterol: 304 mg/dL — ABNORMAL HIGH (ref <200)
HDL: 49 mg/dL (ref >=40)
LDL Cholesterol (Calc): 202 mg/dL (calc) — ABNORMAL HIGH
Non-HDL Cholesterol (Calc): 255 mg/dL (calc) — ABNORMAL HIGH (ref <130)
Total CHOL/HDL Ratio: 6.2 (calc) — ABNORMAL HIGH (ref <5.0)
Triglycerides: 310 mg/dL — ABNORMAL HIGH (ref <150)

## 2019-08-10 LAB — CBC WITH DIFFERENTIAL/PLATELET
Absolute Monocytes: 1019 cells/uL — ABNORMAL HIGH (ref 200–950)
Basophils Absolute: 46 cells/uL (ref 0–200)
Basophils Relative: 0.5 %
Eosinophils Absolute: 137 cells/uL (ref 15–500)
Eosinophils Relative: 1.5 %
HCT: 44.5 % (ref 38.5–50.0)
Hemoglobin: 15.8 g/dL (ref 13.2–17.1)
Lymphs Abs: 2584 cells/uL (ref 850–3900)
MCH: 33.5 pg — ABNORMAL HIGH (ref 27.0–33.0)
MCHC: 35.5 g/dL (ref 32.0–36.0)
MCV: 94.3 fL (ref 80.0–100.0)
MPV: 10.6 fL (ref 7.5–12.5)
Monocytes Relative: 11.2 %
Neutro Abs: 5314 cells/uL (ref 1500–7800)
Neutrophils Relative %: 58.4 %
Platelets: 262 10*3/uL (ref 140–400)
RBC: 4.72 10*6/uL (ref 4.20–5.80)
RDW: 12.1 % (ref 11.0–15.0)
Total Lymphocyte: 28.4 %
WBC: 9.1 10*3/uL (ref 3.8–10.8)

## 2019-08-10 LAB — COMPREHENSIVE METABOLIC PANEL
AG Ratio: 1.6 (calc) (ref 1.0–2.5)
ALT: 39 U/L (ref 9–46)
AST: 21 U/L (ref 10–40)
Albumin: 4.4 g/dL (ref 3.6–5.1)
Alkaline phosphatase (APISO): 62 U/L (ref 36–130)
BUN: 18 mg/dL (ref 7–25)
CO2: 23 mmol/L (ref 20–32)
Calcium: 9.2 mg/dL (ref 8.6–10.3)
Chloride: 104 mmol/L (ref 98–110)
Creat: 1.02 mg/dL (ref 0.60–1.35)
Globulin: 2.7 g/dL (calc) (ref 1.9–3.7)
Glucose, Bld: 89 mg/dL (ref 65–99)
Potassium: 4 mmol/L (ref 3.5–5.3)
Sodium: 137 mmol/L (ref 135–146)
Total Bilirubin: 0.5 mg/dL (ref 0.2–1.2)
Total Protein: 7.1 g/dL (ref 6.1–8.1)

## 2019-08-10 LAB — TSH: TSH: 1.83 mIU/L (ref 0.40–4.50)

## 2019-08-11 NOTE — Assessment & Plan Note (Signed)
Enc better diet and wt loss Labs today

## 2019-08-11 NOTE — Assessment & Plan Note (Signed)
Labs today  Eating less sat and trans fats than he was Extremely high LDL -over 200 (prev lost to f/u)  Suspect statin will be needed for control Disc goals for lipids and reasons to control them Rev last labs with pt Rev low sat fat diet in detail Labs drawn today

## 2019-08-11 NOTE — Assessment & Plan Note (Signed)
Discussed how this problem influences overall health and the risks it imposes  Reviewed plan for weight loss with lower calorie diet (via better food choices and also portion control or program like weight watchers) and exercise building up to or more than 30 minutes 5 days per week including some aerobic activity    

## 2019-08-11 NOTE — Assessment & Plan Note (Signed)
Continues anticoagulation  Not very compliant with INR checks-discussed imp of this

## 2019-08-11 NOTE — Assessment & Plan Note (Signed)
INR today.

## 2019-08-12 ENCOUNTER — Telehealth: Payer: Self-pay | Admitting: Family Medicine

## 2019-08-12 DIAGNOSIS — E7800 Pure hypercholesterolemia, unspecified: Secondary | ICD-10-CM

## 2019-08-12 MED ORDER — ROSUVASTATIN CALCIUM 10 MG PO TABS: 10.0000 mg | ORAL_TABLET | Freq: Every day | ORAL | 11 refills | Status: DC

## 2019-08-12 NOTE — Telephone Encounter (Signed)
I sent it  If any side effects or problems please let me know  Schedule fasting lab in 6 wk to re check cholesterol

## 2019-08-12 NOTE — Telephone Encounter (Signed)
-----   Message from Shon Millet, [place] sent at 08/12/2019 12:33 PM EST ----- Pt notified of lab results and Dr. Royden Purl instructions. Pt is opened to trying Crestor, pt uses [facility] [address]

## 2019-08-12 NOTE — Telephone Encounter (Signed)
Pt notified of lab results and Dr. Royden Purl comments. Pt wasn't sure of his work schedule 6 weeks out so he will call back to schedule lab appt once he knows his schedule

## 2019-08-25 ENCOUNTER — Other Ambulatory Visit: Payer: Self-pay | Admitting: Family Medicine

## 2019-08-25 DIAGNOSIS — Z7901 Long term (current) use of anticoagulants: Secondary | ICD-10-CM

## 2019-08-26 NOTE — Telephone Encounter (Signed)
Last OV 08/09/19 Next coumadin clinic apt: 09-17-19 Last refill: 08/02/19  Pt has been compliant with coumadin management. Sent in script with 2 refills.

## 2019-09-16 ENCOUNTER — Telehealth: Payer: Self-pay

## 2019-09-16 NOTE — Telephone Encounter (Signed)
Pt has coumadin clinic apt tomorrow. Pt needs screened for covid. LVM

## 2019-09-17 ENCOUNTER — Ambulatory Visit: Payer: BC Managed Care – PPO

## 2019-09-24 NOTE — Telephone Encounter (Signed)
RS apt for 3/25 and also labs for cholesterol check. Pt verbalized understanding.

## 2019-10-02 ENCOUNTER — Other Ambulatory Visit: Payer: BC Managed Care – PPO

## 2019-10-03 ENCOUNTER — Other Ambulatory Visit: Payer: BC Managed Care – PPO

## 2019-10-03 ENCOUNTER — Ambulatory Visit (INDEPENDENT_AMBULATORY_CARE_PROVIDER_SITE_OTHER): Payer: BC Managed Care – PPO

## 2019-10-03 ENCOUNTER — Other Ambulatory Visit: Payer: Self-pay

## 2019-10-03 ENCOUNTER — Other Ambulatory Visit (INDEPENDENT_AMBULATORY_CARE_PROVIDER_SITE_OTHER): Payer: BC Managed Care – PPO

## 2019-10-03 DIAGNOSIS — E7800 Pure hypercholesterolemia, unspecified: Secondary | ICD-10-CM | POA: Diagnosis not present

## 2019-10-03 DIAGNOSIS — Z7901 Long term (current) use of anticoagulants: Secondary | ICD-10-CM | POA: Diagnosis not present

## 2019-10-03 LAB — LIPID PANEL
Cholesterol: 203 mg/dL — ABNORMAL HIGH (ref 0–200)
HDL: 57.2 mg/dL (ref >39.00)
NonHDL: 146.24
Total CHOL/HDL Ratio: 4
Triglycerides: 202 mg/dL — ABNORMAL HIGH (ref 0.0–149.0)
VLDL: 40.4 mg/dL — ABNORMAL HIGH (ref 0.0–40.0)

## 2019-10-03 LAB — AST: AST: 26 U/L (ref 0–37)

## 2019-10-03 LAB — LDL CHOLESTEROL, DIRECT: Direct LDL: 125 mg/dL

## 2019-10-03 LAB — POCT INR: INR: 3.1 — AB (ref 2.0–3.0)

## 2019-10-03 LAB — ALT: ALT: 38 U/L (ref 0–53)

## 2019-10-03 NOTE — Patient Instructions (Addendum)
Pre visit review using our clinic review tool, if applicable. No additional management support is needed unless otherwise documented below in the visit note.  Pt will eat some greens today and then continue current dosing as 6mg  daily except 0mg  on Wed.Return in 6 wks.

## 2019-10-04 ENCOUNTER — Encounter: Payer: Self-pay | Admitting: *Deleted

## 2019-11-14 ENCOUNTER — Ambulatory Visit: Payer: BC Managed Care – PPO

## 2019-11-21 ENCOUNTER — Other Ambulatory Visit: Payer: Self-pay

## 2019-11-21 ENCOUNTER — Ambulatory Visit (INDEPENDENT_AMBULATORY_CARE_PROVIDER_SITE_OTHER): Payer: BC Managed Care – PPO

## 2019-11-21 DIAGNOSIS — Z7901 Long term (current) use of anticoagulants: Secondary | ICD-10-CM | POA: Diagnosis not present

## 2019-11-21 LAB — POCT INR: INR: 3.4 — AB (ref 2.0–3.0)

## 2019-11-21 NOTE — Patient Instructions (Addendum)
Pre visit review using our clinic review tool, if applicable. No additional management support is needed unless otherwise documented below in the visit note.  Hold dose today the change weekly dose to 6mg  daily except 0mg  on Wed an d 3 mg Sat.Return in 3 wks.

## 2019-12-06 ENCOUNTER — Other Ambulatory Visit: Payer: Self-pay | Admitting: Family Medicine

## 2019-12-06 DIAGNOSIS — Z7901 Long term (current) use of anticoagulants: Secondary | ICD-10-CM

## 2019-12-06 NOTE — Telephone Encounter (Signed)
Routing to coumadin nurse  

## 2019-12-06 NOTE — Telephone Encounter (Signed)
Pt compliant with coumadin management. Sent in script 

## 2019-12-12 ENCOUNTER — Telehealth: Payer: Self-pay

## 2019-12-12 ENCOUNTER — Ambulatory Visit: Payer: BC Managed Care – PPO

## 2019-12-12 NOTE — Telephone Encounter (Signed)
Pt had to cancel apt today and needs to RS. Can place pt on Tues, 6/8, after 4pm or on 6/10 after 2pm. LVM

## 2019-12-12 NOTE — Telephone Encounter (Signed)
[facility] Night - Client Nonclinical Telephone Record AccessNurse Client [facility] Night - Client Client Site [facility] - Night Physician Tower, Idamae Schuller - MD Contact Type Call Who Is Calling Patient / Member / Family / Caregiver Caller Name [name] [name] Caller Phone Number [phone] Call Type Message Only Information Provided Reason for Call Returning a Call from the Office Initial Comment Caller states that he has an appt tomorrow at 12:15 and is calling to confirm that appt Additional Comment Caller states that he has an appt tomorrow at 12:15 and is calling to confirm that appt. Caller states that he is experiencing symptoms of a head cold and would like to speak to the office about that first thing in the morning to find out about coming in still for his scheduled Coumadin check. Office hours provided Disp. Time Disposition Final User 12/11/2019 5:00:23 PM General Information Provided Yes Joselyn Glassman Call Closed By: Joselyn Glassman Transaction Date/Time: 12/11/2019 4:56:47 PM (ET)

## 2019-12-13 NOTE — Telephone Encounter (Signed)
LVM

## 2019-12-24 ENCOUNTER — Ambulatory Visit (INDEPENDENT_AMBULATORY_CARE_PROVIDER_SITE_OTHER): Payer: BC Managed Care – PPO

## 2019-12-24 ENCOUNTER — Other Ambulatory Visit: Payer: Self-pay

## 2019-12-24 DIAGNOSIS — Z7901 Long term (current) use of anticoagulants: Secondary | ICD-10-CM

## 2019-12-24 LAB — POCT INR: INR: 1.6 — AB (ref 2.0–3.0)

## 2019-12-24 NOTE — Patient Instructions (Addendum)
Pre visit review using our clinic review tool, if applicable. No additional management support is needed unless otherwise documented below in the visit note.  Add 6 mg to schedule for tomorrow only then continue weekly dose to take  6mg  daily except 0mg  on Wed an d 3 mg Sat.

## 2020-01-16 ENCOUNTER — Other Ambulatory Visit: Payer: Self-pay

## 2020-01-16 ENCOUNTER — Ambulatory Visit (INDEPENDENT_AMBULATORY_CARE_PROVIDER_SITE_OTHER): Payer: BC Managed Care – PPO

## 2020-01-16 DIAGNOSIS — Z7901 Long term (current) use of anticoagulants: Secondary | ICD-10-CM | POA: Diagnosis not present

## 2020-01-16 LAB — POCT INR: INR: 3.2 — AB (ref 2.0–3.0)

## 2020-01-16 NOTE — Patient Instructions (Addendum)
Pre visit review using our clinic review tool, if applicable. No additional management support is needed unless otherwise documented below in the visit note.  Take 1.2 tablet today then continue weekly dose to take  6mg  daily except 0mg  on Wed and 3 mg Sat. Return in 4 wks

## 2020-02-13 ENCOUNTER — Ambulatory Visit: Payer: BC Managed Care – PPO

## 2020-02-25 ENCOUNTER — Telehealth: Payer: Self-pay

## 2020-02-25 NOTE — Telephone Encounter (Signed)
Pt missed coumadin apt on 8/5. Pt was diagnosed with covid on 8/10. He cannot be in office until after 8/25.    Contacted pt and he reported he will need to call back because he was taking his father to the ER at the moment.

## 2020-02-27 NOTE — Telephone Encounter (Signed)
Aware, thanks!

## 2020-02-27 NOTE — Telephone Encounter (Signed)
Contacted pt who was covid positive on 8/10. He missed his coumadin apt on 8/17. He cannot be seen in office until after 8/24.  Pt reports he is doing ok concerning symptoms. he does have a cough and congestion. Advised he can use OTC cough medications and tylenol if needed. Pt is not immunized. Scheduled pt for next available coumadin apt which is 8/26. Advised pt if he still has symptoms at that time to contact office of if symptoms worsen to contact office. Pt verbalized understanding.

## 2020-03-01 ENCOUNTER — Other Ambulatory Visit: Payer: Self-pay | Admitting: Family Medicine

## 2020-03-01 DIAGNOSIS — Z7901 Long term (current) use of anticoagulants: Secondary | ICD-10-CM

## 2020-03-02 NOTE — Telephone Encounter (Signed)
Pt compliant with coumadin management. Sent in script 

## 2020-03-02 NOTE — Telephone Encounter (Signed)
Routing to coumadin nurse  

## 2020-03-05 ENCOUNTER — Ambulatory Visit: Payer: BC Managed Care – PPO

## 2020-03-11 NOTE — Telephone Encounter (Signed)
Contacted pt to RS. Pt is scheduled for tomorrow

## 2020-03-12 ENCOUNTER — Other Ambulatory Visit: Payer: Self-pay

## 2020-03-12 ENCOUNTER — Ambulatory Visit (INDEPENDENT_AMBULATORY_CARE_PROVIDER_SITE_OTHER): Payer: BC Managed Care – PPO

## 2020-03-12 DIAGNOSIS — Z7901 Long term (current) use of anticoagulants: Secondary | ICD-10-CM

## 2020-03-12 DIAGNOSIS — I81 Portal vein thrombosis: Secondary | ICD-10-CM | POA: Diagnosis not present

## 2020-03-12 LAB — POCT INR: INR: 4.8 — AB (ref 2.0–3.0)

## 2020-03-12 NOTE — Patient Instructions (Addendum)
Pre visit review using our clinic review tool, if applicable. No additional management support is needed unless otherwise documented below in the visit note.   Hold dose today and tomorrow and then continue weekly dose to take  6mg  daily except 0mg  on Wed and 3 mg Sat. Return in 2 wks.

## 2020-03-16 NOTE — Progress Notes (Signed)
Agree. Thanks

## 2020-03-26 ENCOUNTER — Other Ambulatory Visit: Payer: Self-pay

## 2020-03-26 ENCOUNTER — Ambulatory Visit (INDEPENDENT_AMBULATORY_CARE_PROVIDER_SITE_OTHER): Payer: BC Managed Care – PPO

## 2020-03-26 ENCOUNTER — Ambulatory Visit: Payer: BC Managed Care – PPO

## 2020-03-26 DIAGNOSIS — Z7901 Long term (current) use of anticoagulants: Secondary | ICD-10-CM

## 2020-03-26 LAB — POCT INR: INR: 2.6 (ref 2.0–3.0)

## 2020-03-26 NOTE — Patient Instructions (Addendum)
Pre visit review using our clinic review tool, if applicable. No additional management support is needed unless otherwise documented below in the visit note.  Continue taking 6mg  daily except taking 0mg  on Wed and 3mg  on Sat and recheck in 4 wks.

## 2020-04-02 ENCOUNTER — Ambulatory Visit: Payer: BC Managed Care – PPO

## 2020-04-23 ENCOUNTER — Other Ambulatory Visit: Payer: Self-pay

## 2020-04-23 ENCOUNTER — Ambulatory Visit (INDEPENDENT_AMBULATORY_CARE_PROVIDER_SITE_OTHER): Payer: BC Managed Care – PPO

## 2020-04-23 DIAGNOSIS — Z7901 Long term (current) use of anticoagulants: Secondary | ICD-10-CM

## 2020-04-23 LAB — POCT INR: INR: 3.1 — AB (ref 2.0–3.0)

## 2020-04-23 NOTE — Patient Instructions (Addendum)
Pre visit review using our clinic review tool, if applicable. No additional management support is needed unless otherwise documented below in the visit note.  Take 3mg  today then continue taking 6mg  daily except taking 0mg  on Wed and 3mg  on Sat and recheck in 5 wks.

## 2020-05-28 ENCOUNTER — Ambulatory Visit: Payer: BC Managed Care – PPO

## 2020-05-31 ENCOUNTER — Other Ambulatory Visit: Payer: Self-pay | Admitting: Family Medicine

## 2020-05-31 DIAGNOSIS — Z7901 Long term (current) use of anticoagulants: Secondary | ICD-10-CM

## 2020-06-01 NOTE — Telephone Encounter (Signed)
Pt is compliant with coumadin management. Sent in refill 

## 2020-06-11 ENCOUNTER — Other Ambulatory Visit: Payer: Self-pay

## 2020-06-11 ENCOUNTER — Ambulatory Visit (INDEPENDENT_AMBULATORY_CARE_PROVIDER_SITE_OTHER): Payer: BC Managed Care – PPO

## 2020-06-11 DIAGNOSIS — Z7901 Long term (current) use of anticoagulants: Secondary | ICD-10-CM

## 2020-06-11 LAB — POCT INR: INR: 2.5 (ref 2.0–3.0)

## 2020-06-11 NOTE — Patient Instructions (Addendum)
Pre visit review using our clinic review tool, if applicable. No additional management support is needed unless otherwise documented below in the visit note.  Continue taking 6mg  daily except taking 0mg  on Wed and 3mg  on Sat and recheck in 5 wks.

## 2020-07-16 ENCOUNTER — Ambulatory Visit: Payer: BC Managed Care – PPO

## 2020-07-22 ENCOUNTER — Telehealth: Payer: Self-pay

## 2020-07-22 NOTE — Telephone Encounter (Signed)
Pt missed apt last week for coumadin clinic. LVM for pt to call and RS.

## 2020-08-05 NOTE — Telephone Encounter (Signed)
Contacted pt to schedule coumadin clinic apt. Pt reports he has to look at his schedule. Advised importance of having INR monitoring and that it has been 7 wks since last test. Pt voiced understanding and said he would call back as soon as he could get his new schedule

## 2020-08-18 ENCOUNTER — Other Ambulatory Visit: Payer: Self-pay | Admitting: Family Medicine

## 2020-08-18 NOTE — Telephone Encounter (Signed)
Please schedule PE and refill until then  

## 2020-08-18 NOTE — Telephone Encounter (Signed)
CPE was over a year ago and no future appts

## 2020-08-18 NOTE — Telephone Encounter (Signed)
Med refilled once and Carrie will reach out to pt to try and schedule appt 

## 2020-08-25 ENCOUNTER — Other Ambulatory Visit: Payer: Self-pay | Admitting: Family Medicine

## 2020-08-25 DIAGNOSIS — Z7901 Long term (current) use of anticoagulants: Secondary | ICD-10-CM

## 2020-08-25 NOTE — Telephone Encounter (Signed)
Pharmacy requests refill on: Warfarin 6 mg   LAST REFILL: 06/01/2020 (Q-90, R-0) LAST OV: 06/11/2020 NEXT OV: Not Scheduled  PHARMACY: [id number] [place], [place]

## 2020-08-28 NOTE — Telephone Encounter (Signed)
Called and LVM for patient to return call to office to schedule coumadin clinic appointment.

## 2020-09-03 NOTE — Telephone Encounter (Signed)
LVM

## 2020-09-08 ENCOUNTER — Ambulatory Visit (INDEPENDENT_AMBULATORY_CARE_PROVIDER_SITE_OTHER): Payer: BC Managed Care – PPO

## 2020-09-08 ENCOUNTER — Other Ambulatory Visit: Payer: Self-pay

## 2020-09-08 DIAGNOSIS — Z7901 Long term (current) use of anticoagulants: Secondary | ICD-10-CM

## 2020-09-08 LAB — POCT INR: INR: 2.4 (ref 2.0–3.0)

## 2020-09-08 NOTE — Patient Instructions (Addendum)
Pre visit review using our clinic review tool, if applicable. No additional management support is needed unless otherwise documented below in the visit note.  Continue taking 6mg  daily except taking 0mg  on Wed and 3mg  on Sat and recheck in 5 wks per patient request.

## 2020-09-09 NOTE — Progress Notes (Signed)
Agree. Thanks

## 2020-09-12 ENCOUNTER — Other Ambulatory Visit: Payer: Self-pay | Admitting: Family Medicine

## 2020-09-21 ENCOUNTER — Other Ambulatory Visit: Payer: Self-pay | Admitting: Family Medicine

## 2020-09-21 DIAGNOSIS — Z7901 Long term (current) use of anticoagulants: Secondary | ICD-10-CM

## 2020-09-21 NOTE — Telephone Encounter (Signed)
Pt has not been compliant with apts for coumadin management in the past so only sending in 30 day supply.

## 2020-10-13 ENCOUNTER — Ambulatory Visit: Payer: BC Managed Care – PPO

## 2020-10-15 ENCOUNTER — Telehealth: Payer: Self-pay

## 2020-10-15 NOTE — Telephone Encounter (Signed)
Called and LVM for patient to call office back and make appointment for INR check.

## 2020-10-19 NOTE — Telephone Encounter (Signed)
Called and spoke with patient to get him scheduled for INR appointment. Patient stated that he would have to call back tomorrow to try to set up an appointment. Educated patient on the importance of having INR checked regularly. Patient verbalized understanding.

## 2020-10-20 ENCOUNTER — Telehealth: Payer: Self-pay | Admitting: Family Medicine

## 2020-10-20 DIAGNOSIS — Z7901 Long term (current) use of anticoagulants: Secondary | ICD-10-CM

## 2020-10-20 NOTE — Telephone Encounter (Signed)
Will route to coumadin nurse, pt no-showed last appt with them and also has been told multiple times by phone to schedule a f/u with PCP (hasn't been seen in over a year) and no future appts have been made for either coumadin nurse or PCP

## 2020-10-21 ENCOUNTER — Other Ambulatory Visit: Payer: Self-pay

## 2020-10-21 ENCOUNTER — Ambulatory Visit (INDEPENDENT_AMBULATORY_CARE_PROVIDER_SITE_OTHER): Payer: BC Managed Care – PPO

## 2020-10-21 DIAGNOSIS — Z7901 Long term (current) use of anticoagulants: Secondary | ICD-10-CM | POA: Diagnosis not present

## 2020-10-21 LAB — POCT INR: INR: 2.3 (ref 2.0–3.0)

## 2020-10-21 NOTE — Telephone Encounter (Signed)
Called and spoke with patient in regards to setting up appointment for coumadin clinic. Patient will be coming in today to have INR checked at 12:15. Will have patient schedule appointment with PCP at this time.

## 2020-10-21 NOTE — Telephone Encounter (Signed)
Error

## 2020-10-21 NOTE — Patient Instructions (Addendum)
Pre visit review using our clinic review tool, if applicable. No additional management support is needed unless otherwise documented below in the visit note.  Continue taking 6mg  daily except taking 0mg  on Wed and 3mg  on Sat and recheck in 5 wks per patient request.

## 2020-10-21 NOTE — Telephone Encounter (Signed)
Patient came for Coumadin Clinic appointment and INR was 2.3. Instructed patient that he needed to make an appointment with Dr. Milinda Antis. Patient verbalized understanding and state that he would call back to schedule appointment.

## 2020-10-21 NOTE — Telephone Encounter (Signed)
Received a refill request for coumadin. PCP requested pt schedule a f/u with her also. Very important he make apt with PCP and coumadin clinic for further refills.

## 2020-10-22 NOTE — Telephone Encounter (Signed)
Pharmacy requests refill on: Warfarin 6 mg   LAST REFILL: 09/21/2020 (Q-30, R-0) LAST OV: 10/21/2020 NEXT OV: 11/24/2020 PHARMACY: [id number], [place]   Patient still needs to make an appointment with Dr. Milinda Antis. Please call and schedule appointment.

## 2020-10-22 NOTE — Telephone Encounter (Signed)
Patient scheduled follow up appointment with Dr.Tower on 11/04/20.

## 2020-11-04 ENCOUNTER — Ambulatory Visit (INDEPENDENT_AMBULATORY_CARE_PROVIDER_SITE_OTHER): Payer: BC Managed Care – PPO | Admitting: Family Medicine

## 2020-11-04 ENCOUNTER — Other Ambulatory Visit: Payer: Self-pay

## 2020-11-04 ENCOUNTER — Encounter: Payer: Self-pay | Admitting: Family Medicine

## 2020-11-04 VITALS — BP 122/78 | HR 69 | Temp 96.9°F | Ht 72.5 in | Wt 262.4 lb

## 2020-11-04 DIAGNOSIS — E669 Obesity, unspecified: Secondary | ICD-10-CM

## 2020-11-04 DIAGNOSIS — E7800 Pure hypercholesterolemia, unspecified: Secondary | ICD-10-CM

## 2020-11-04 DIAGNOSIS — I81 Portal vein thrombosis: Secondary | ICD-10-CM

## 2020-11-04 DIAGNOSIS — K760 Fatty (change of) liver, not elsewhere classified: Secondary | ICD-10-CM | POA: Diagnosis not present

## 2020-11-04 LAB — COMPREHENSIVE METABOLIC PANEL
ALT: 40 U/L (ref 0–53)
AST: 26 U/L (ref 0–37)
Albumin: 4.5 g/dL (ref 3.5–5.2)
Alkaline Phosphatase: 59 U/L (ref 39–117)
BUN: 17 mg/dL (ref 6–23)
CO2: 25 mEq/L (ref 19–32)
Calcium: 9.3 mg/dL (ref 8.4–10.5)
Chloride: 102 mEq/L (ref 96–112)
Creatinine, Ser: 1.03 mg/dL (ref 0.40–1.50)
GFR: 86.08 mL/min (ref >60.00)
Glucose, Bld: 95 mg/dL (ref 70–99)
Potassium: 4.1 mEq/L (ref 3.5–5.1)
Sodium: 136 mEq/L (ref 135–145)
Total Bilirubin: 0.6 mg/dL (ref 0.2–1.2)
Total Protein: 7.5 g/dL (ref 6.0–8.3)

## 2020-11-04 LAB — LIPID PANEL
Cholesterol: 286 mg/dL — ABNORMAL HIGH (ref 0–200)
HDL: 52.3 mg/dL (ref >39.00)
NonHDL: 233.58
Total CHOL/HDL Ratio: 5
Triglycerides: 260 mg/dL — ABNORMAL HIGH (ref 0.0–149.0)
VLDL: 52 mg/dL — ABNORMAL HIGH (ref 0.0–40.0)

## 2020-11-04 LAB — LDL CHOLESTEROL, DIRECT: Direct LDL: 205 mg/dL

## 2020-11-04 MED ORDER — ROSUVASTATIN CALCIUM 5 MG PO TABS: ORAL_TABLET | ORAL | 3 refills | Status: DC

## 2020-11-04 NOTE — Progress Notes (Signed)
Subjective:    Patient ID: [name] [name], male    DOB: June 13, 1971, 50 y.o.   MRN: [id number]  This visit occurred during the SARS-CoV-2 public health emergency.  Safety protocols were in place, including screening questions prior to the visit, additional usage of staff PPE, and extensive cleaning of exam room while observing appropriate contact time as indicated for disinfecting solutions.    HPI Pt presents for f/u of chronic health problems   Wt Readings from Last 3 Encounters:  11/04/20 262 lb 6 oz (119 kg)  08/09/19 262 lb 3 oz (118.9 kg)  11/07/17 260 lb (117.9 kg)   35.10 kg/m   Working  Taking fair care of himself  Not a lot of exercise -too tired / small child also  New job upcoming-closer to home (still [employer] but closer)    BP Readings from Last 3 Encounters:  11/04/20 122/78  08/09/19 132/86  11/07/17 128/66   Pulse Readings from Last 3 Encounters:  11/04/20 69  08/09/19 90  11/07/17 78    H/o fatty liver  Due for labs Lab Results  Component Value Date   ALT 38 10/03/2019   AST 26 10/03/2019   ALKPHOS 66 07/24/2017   BILITOT 0.5 08/09/2019   Hyperlipidemia  Lab Results  Component Value Date   CHOL 203 (H) 10/03/2019   HDL 57.20 10/03/2019   LDLCALC 202 (H) 08/09/2019   LDLDIRECT 125.0 10/03/2019   TRIG 202.0 (H) 10/03/2019   CHOLHDL 4 10/03/2019   Was taking crestor 10 mg  Ran out and noted that body aches got better   Diet is fair  Tries to avoid fried food  Some red meat and sausage and bacon     Chronic anticoagulation for h/o portal vein thrombosis Lab Results  Component Value Date   INR 2.3 10/21/2020   INR 2.4 09/08/2020   INR 2.5 06/11/2020   PROTIME 16.8 (H) 05/25/2012   PROTIME 38.4 (H) 03/21/2012    Patient Active Problem List   Diagnosis Date Noted  . Obesity (BMI 30-39.9) 07/24/2017  . Long term (current) use of anticoagulants 04/19/2017  . Encounter for therapeutic drug monitoring 11/30/2016  . Long term  current use of anticoagulant therapy 09/01/2014  . Low back pain on left side with sciatica 03/27/2013  . Change in vision 12/06/2012  . Hyperlipidemia 04/23/2012  . Routine general medical examination at a health care facility 04/16/2012  . Fatty liver 03/18/2012  . Portal vein thrombosis 03/13/2012  . Ureterolithiasis 03/09/2012   Past Medical History:  Diagnosis Date  . Heartburn   . Hepatic steatosis    asymptomatic  . Kidney stones   . Portal vein thrombosis    Past Surgical History:  Procedure Laterality Date  . ANTERIOR CRUCIATE LIGAMENT REPAIR  12/2005   left   . LASIK     left eye   Social History   Tobacco Use  . Smoking status: Never Smoker  . Smokeless tobacco: Never Used  Substance Use Topics  . Alcohol use: Yes    Alcohol/week: 0.0 standard drinks    Comment: "1 to 3 per night and a couple weeks with nothing at all"  . Drug use: No   Family History  Problem Relation Age of Onset  . Diabetes Maternal Grandmother   . Stroke Maternal Grandmother   . Colon cancer Neg Hx   . Gallbladder disease Father   . Hyperlipidemia Mother   . Deep vein thrombosis Maternal Aunt  No Known Allergies Current Outpatient Medications on File Prior to Visit  Medication Sig Dispense Refill  . warfarin (COUMADIN) 6 MG tablet TAKE 1 TABLET DAILY EXCEPT TAKE ZERO TABLETS ON WEDNESDAYS OR AS DIRECTED BY ANTICOAGULATION CLINIC 30 tablet 0   No current facility-administered medications on file prior to visit.    Review of Systems  Constitutional: Positive for fatigue. Negative for activity change, appetite change, fever and unexpected weight change.       Fatigue from busy schedule   HENT: Negative for congestion, rhinorrhea, sore throat and trouble swallowing.   Eyes: Negative for pain, redness, itching and visual disturbance.  Respiratory: Negative for cough, chest tightness, shortness of breath and wheezing.   Cardiovascular: Negative for chest pain and palpitations.   Gastrointestinal: Negative for abdominal pain, blood in stool, constipation, diarrhea and nausea.  Endocrine: Negative for cold intolerance, heat intolerance, polydipsia and polyuria.  Genitourinary: Negative for difficulty urinating, dysuria, frequency and urgency.  Musculoskeletal: Negative for arthralgias, joint swelling and myalgias.  Skin: Negative for pallor and rash.  Neurological: Negative for dizziness, tremors, weakness, numbness and headaches.  Hematological: Negative for adenopathy. Does not bruise/bleed easily.  Psychiatric/Behavioral: Negative for decreased concentration and dysphoric mood. The patient is not nervous/anxious.        Objective:   Physical Exam Constitutional:      General: He is not in acute distress.    Appearance: Normal appearance. He is well-developed. He is obese. He is not ill-appearing.  HENT:     Head: Normocephalic and atraumatic.  Eyes:     Conjunctiva/sclera: Conjunctivae normal.     Pupils: Pupils are equal, round, and reactive to light.  Neck:     Thyroid: No thyromegaly.     Vascular: No carotid bruit or JVD.  Cardiovascular:     Rate and Rhythm: Normal rate and regular rhythm.     Pulses: Normal pulses.     Heart sounds: Normal heart sounds. No gallop.   Pulmonary:     Effort: Pulmonary effort is normal. No respiratory distress.     Breath sounds: Normal breath sounds. No wheezing or rales.  Abdominal:     General: Bowel sounds are normal. There is no distension or abdominal bruit.     Palpations: Abdomen is soft. There is no mass.     Tenderness: There is no abdominal tenderness. There is no guarding or rebound.  Musculoskeletal:     Cervical back: Normal range of motion and neck supple.     Right lower leg: No edema.     Left lower leg: No edema.  Lymphadenopathy:     Cervical: No cervical adenopathy.  Skin:    General: Skin is warm and dry.     Coloration: Skin is not jaundiced or pale.     Findings: No erythema or rash.   Neurological:     Mental Status: He is alert.     Coordination: Coordination normal.     Deep Tendon Reflexes: Reflexes are normal and symmetric. Reflexes normal.  Psychiatric:        Mood and Affect: Mood normal.           Assessment & Plan:   Problem List Items Addressed This Visit      Cardiovascular and Mediastinum   Portal vein thrombosis    Pt continues to do well with anticoagulation  No complaints       Relevant Medications   rosuvastatin (CRESTOR) 5 MG tablet     Digestive  Fatty liver    Labs today  Encouraged lower fat diet and wt loss effort        Other   Hyperlipidemia - Primary    Disc goals for lipids and reasons to control them Rev last labs with pt Rev low sat fat diet in detail Re check today -no medication currently  Will then try crestor 5 mg twice weekly with co enzyme q 10 to reduce side eff (if not helpful inst to update and stop medicine)        Relevant Medications   rosuvastatin (CRESTOR) 5 MG tablet   Other Relevant Orders   Lipid panel (Completed)   Comprehensive metabolic panel (Completed)   Obesity (BMI 30-39.9)    Discussed how this problem influences overall health and the risks it imposes  Reviewed plan for weight loss with lower calorie diet (via better food choices and also portion control or program like weight watchers) and exercise building up to or more than 30 minutes 5 days per week including some aerobic activity

## 2020-11-04 NOTE — Assessment & Plan Note (Signed)
Pt continues to do well with anticoagulation  No complaints

## 2020-11-04 NOTE — Assessment & Plan Note (Signed)
Disc goals for lipids and reasons to control them Rev last labs with pt Rev low sat fat diet in detail Re check today -no medication currently  Will then try crestor 5 mg twice weekly with co enzyme q 10 to reduce side eff (if not helpful inst to update and stop medicine)

## 2020-11-04 NOTE — Assessment & Plan Note (Signed)
Labs today  Encouraged lower fat diet and wt loss effort

## 2020-11-04 NOTE — Assessment & Plan Note (Signed)
Discussed how this problem influences overall health and the risks it imposes  Reviewed plan for weight loss with lower calorie diet (via better food choices and also portion control or program like weight watchers) and exercise building up to or more than 30 minutes 5 days per week including some aerobic activity    

## 2020-11-04 NOTE — Patient Instructions (Addendum)
Co enzyme Q-10 over the counter helps with statin pain  Let's try 5 mg of crestor twice weekly  Let me know if any body pain returns   Avoid red meat/ fried foods/ egg yolks/ fatty breakfast meats/ butter, cheese and high fat dairy/ and shellfish    Get exercise when you can

## 2020-11-18 ENCOUNTER — Other Ambulatory Visit: Payer: Self-pay | Admitting: Family Medicine

## 2020-11-18 DIAGNOSIS — Z7901 Long term (current) use of anticoagulants: Secondary | ICD-10-CM

## 2020-11-18 NOTE — Telephone Encounter (Signed)
Pharmacy requests refill on: Warfarin 6 mg   LAST REFILL: 10/22/2020 (Q-30, R-0) LAST OV: 10/21/2020 NEXT OV: 11/24/2020 PHARMACY: [id number] [place], [place]

## 2020-11-24 ENCOUNTER — Ambulatory Visit: Payer: BC Managed Care – PPO

## 2020-11-26 ENCOUNTER — Telehealth: Payer: Self-pay

## 2020-11-26 NOTE — Telephone Encounter (Signed)
Called and LVM for patient to call office back to re-schedule INR check with Coumadin Clinic since he missed his appointment on 5/17. Awaiting call back.

## 2020-12-08 NOTE — Telephone Encounter (Signed)
LVM for pt to call to [facility]

## 2020-12-11 ENCOUNTER — Other Ambulatory Visit: Payer: Self-pay | Admitting: Family Medicine

## 2020-12-11 DIAGNOSIS — Z7901 Long term (current) use of anticoagulants: Secondary | ICD-10-CM

## 2020-12-11 NOTE — Telephone Encounter (Signed)
Will route to coumadin nurse for review  

## 2020-12-14 NOTE — Telephone Encounter (Signed)
Contacted pt who said he will return call to schedule but he may need to go to another location. Advised he would need to see Arline Asp if he went to another location. He said he would call back to discuss this.

## 2020-12-18 NOTE — Telephone Encounter (Signed)
Contacted pt who would not make an apt for coumadin clinic but said he does have a lab apt on 6/20 and asked for INR to be checked at that apt. Advised this nurse will not be in the office then but would see if that was possible and if so they could check it at that lab apt. Pt was appreciative. Will send msg to RN that will be in office at that time. Will also send msg to lab staff that a POCT INR will need to be done that day and the results sent to [facility].

## 2020-12-18 NOTE — Telephone Encounter (Signed)
Pt is coming in for lab apt on 6/20. Sent in script

## 2020-12-27 ENCOUNTER — Telehealth: Payer: Self-pay | Admitting: Family Medicine

## 2020-12-27 DIAGNOSIS — E7800 Pure hypercholesterolemia, unspecified: Secondary | ICD-10-CM

## 2020-12-27 NOTE — Telephone Encounter (Signed)
-----   Message from Aquilla Solian, RT sent at 12/14/2020 11:27 AM EDT ----- Regarding: Lab Orders for Monday 6.20.2022 Please place lab orders for Monday 6.20.2022, appt notes state "fasting labs, lipids 6 weeks" Thank you, Jones Bales RT(R)

## 2020-12-28 ENCOUNTER — Other Ambulatory Visit (INDEPENDENT_AMBULATORY_CARE_PROVIDER_SITE_OTHER): Payer: BC Managed Care – PPO

## 2020-12-28 ENCOUNTER — Telehealth: Payer: Self-pay

## 2020-12-28 ENCOUNTER — Ambulatory Visit (INDEPENDENT_AMBULATORY_CARE_PROVIDER_SITE_OTHER): Payer: BC Managed Care – PPO

## 2020-12-28 ENCOUNTER — Other Ambulatory Visit: Payer: Self-pay

## 2020-12-28 DIAGNOSIS — E7800 Pure hypercholesterolemia, unspecified: Secondary | ICD-10-CM

## 2020-12-28 DIAGNOSIS — I81 Portal vein thrombosis: Secondary | ICD-10-CM

## 2020-12-28 DIAGNOSIS — Z7901 Long term (current) use of anticoagulants: Secondary | ICD-10-CM

## 2020-12-28 LAB — LDL CHOLESTEROL, DIRECT: Direct LDL: 203 mg/dL

## 2020-12-28 LAB — AST: AST: 29 U/L (ref 0–37)

## 2020-12-28 LAB — LIPID PANEL
Cholesterol: 303 mg/dL — ABNORMAL HIGH (ref 0–200)
HDL: 49.4 mg/dL (ref >39.00)
NonHDL: 253.54
Total CHOL/HDL Ratio: 6
Triglycerides: 346 mg/dL — ABNORMAL HIGH (ref 0.0–149.0)
VLDL: 69.2 mg/dL — ABNORMAL HIGH (ref 0.0–40.0)

## 2020-12-28 LAB — ALT: ALT: 43 U/L (ref 0–53)

## 2020-12-28 LAB — POCT INR: INR: 1.7 — AB (ref 2.0–3.0)

## 2020-12-28 NOTE — Telephone Encounter (Signed)
Sorry- I just saw the result note on that and asked if he was taking it . Cholesterol is extremely high  I would like to refer him to the lipid specialist w/in cardiology for help with management  Let me know if agreeable

## 2020-12-28 NOTE — Telephone Encounter (Signed)
Patient is requesting to transfer to either [facility] or [facility] coumadin clinic under Bailey Mech, RN due to travel convenience.   I checked his INR today at 1.7 and gave him dosing instructions.  We need to recheck him in 3 weeks.  He would like to start with the new clinic at that time.   I will route this to [facility] to see if she is able to take patient on and ask that she reach out to get him scheduled for his next check, if able.   Note: patient recently stopped his rosuvastatin in the past week due to increased myalgias.  I will also copy this to Dr. Milinda Antis as an Lorain Childes as he stopped this medication on his own.   Thanks.

## 2020-12-28 NOTE — Patient Instructions (Signed)
NR today 1.7.  He reports that his statin was discontinued 1 week ago which could require that we increase his dosing long term; however, since this is his first time subtherapeutic, will boost today (6/20) taking an extra 1/2 (9mg ) and then resume his prior dosing 6mg  daily EXCEPT 0mg  WED and 3mg  Saturdays.  Will recheck in 3 weeks.   He is requesting to change to either the [facility] or [place] locations as this is more convenient for him.  I will reach out to , RN to contact patient to get on her schedule for 3 weeks at one of these locations.

## 2020-12-29 NOTE — Telephone Encounter (Signed)
The referral is in.

## 2020-12-29 NOTE — Addendum Note (Signed)
Addended by: Roxy Manns A on: 12/29/2020 08:45 PM   Modules accepted: Orders

## 2020-12-29 NOTE — Telephone Encounter (Signed)
Patient called and he said he is agreeable but he was a little hesitant with when they may schedule him. But he would like to see the lipid specialist w/in cardiology.

## 2021-01-01 NOTE — Telephone Encounter (Signed)
Please refer to coag encounter for visit info and directions.   Note: patient is going to transfer to [facility] for INR management moving forward under Bailey Mech, RN as it is closer.  I did confirm that it appears he has next INR scheduled for 01/19/21.   FYI to Sherrie George, RN [facility] Coumadin clinic nurse.

## 2021-01-04 NOTE — Telephone Encounter (Signed)
Noted  

## 2021-01-19 ENCOUNTER — Ambulatory Visit (INDEPENDENT_AMBULATORY_CARE_PROVIDER_SITE_OTHER): Payer: BC Managed Care – PPO | Admitting: General Practice

## 2021-01-19 DIAGNOSIS — I81 Portal vein thrombosis: Secondary | ICD-10-CM

## 2021-01-19 DIAGNOSIS — Z7901 Long term (current) use of anticoagulants: Secondary | ICD-10-CM | POA: Diagnosis not present

## 2021-01-19 LAB — POCT INR: INR: 2.2 (ref 2.0–3.0)

## 2021-01-19 NOTE — Progress Notes (Signed)
Medical screening examination/treatment/procedure(s) were performed by non-physician practitioner and as supervising physician I was immediately available for consultation/collaboration. I agree with above. Mandalyn Pasqua, MD   

## 2021-01-19 NOTE — Patient Instructions (Signed)
Pre visit review using our clinic review tool, if applicable. No additional management support is needed unless otherwise documented below in the visit note.   Take 1 tablet daily except nothing on Wednesdays.  Re-check in 6 weeks.  

## 2021-01-20 ENCOUNTER — Other Ambulatory Visit: Payer: Self-pay | Admitting: Family Medicine

## 2021-01-20 DIAGNOSIS — Z7901 Long term (current) use of anticoagulants: Secondary | ICD-10-CM

## 2021-01-20 NOTE — Telephone Encounter (Signed)
Routing to coumadin nurse for review

## 2021-01-22 NOTE — Telephone Encounter (Signed)
Pt compliant with coumadin management. Sent in script 

## 2021-02-18 ENCOUNTER — Other Ambulatory Visit: Payer: Self-pay | Admitting: Family Medicine

## 2021-02-18 DIAGNOSIS — Z7901 Long term (current) use of anticoagulants: Secondary | ICD-10-CM

## 2021-02-18 NOTE — Telephone Encounter (Signed)
Will route to coumadin nurse who saw pt last

## 2021-02-23 NOTE — Telephone Encounter (Signed)
[facility] reports there is still one refill on script sent in on 01/22/21. They are unsure why the request was sent.  Denied refill request.

## 2021-03-05 NOTE — Progress Notes (Signed)
Cardiology Office Note   Date:  03/08/2021   ID:  [name] [name], [name] [name] Nov 22, 1970, MRN [id number]  PCP:  Judy Pimple, MD  Cardiologist:   Jacarra Bobak [place], MD   Chief Complaint  Patient presents with   Hyperlipidemia      History of Present Illness: [name] [name] is a 50 y.o. male who is seen at the request of Dr Milinda Antis for evaluation of CV risk and hyperlipidemia. He has a history of portal vein thrombosis and is on chronic Coumadin. History of obesity and hepatic steatosis. He reports he has gained 30 lbs in the last 2-3 years. Not very active. Eats too many desserts. Cholesterol has been high for some time with LDL from 176-200 over the past 8 years and elevated triglycerides as well. Was placed on Crestor but even at 5 mg twice a week he had myalgias and stopped the medication. He has no known history of vascular disease. No family history of premature vascular disease. Denies any chest pain or dyspnea.    Past Medical History:  Diagnosis Date   Heartburn    Hepatic steatosis    asymptomatic   Kidney stones    Portal vein thrombosis     Past Surgical History:  Procedure Laterality Date   ANTERIOR CRUCIATE LIGAMENT REPAIR  12/2005   left    LASIK     left eye     Current Outpatient Medications  Medication Sig Dispense Refill   pravastatin (PRAVACHOL) 20 MG tablet Take 0.5 tablets (10 mg total) by mouth daily. 30 tablet 11   warfarin (COUMADIN) 6 MG tablet TAKE 1 TABLET DAILY EXCEPT TAKE ZERO TABLETS ON WEDNESDAYS OR AS DIRECTED BY ANTICOAGULATION CLINIC 30 tablet 1   No current facility-administered medications for this visit.    Allergies:   Patient has no known allergies.    Social History:  The patient  reports that he has never smoked. He has never used smokeless tobacco. He reports current alcohol use. He reports that he does not use drugs.   Family History:  The patient's family history includes Deep vein thrombosis in his maternal aunt; Dementia in his  father; Diabetes in his maternal grandmother; Gallbladder disease in his father; Hyperlipidemia in his mother; Stroke in his maternal grandmother.    ROS:  Please see the history of present illness.   Otherwise, review of systems are positive for none.   All other systems are reviewed and negative.    PHYSICAL EXAM: VS:  BP 100/70   Pulse 83   Ht 6' (1.829 m)   Wt 264 lb (119.7 kg)   SpO2 97%   BMI 35.80 kg/m  , BMI Body mass index is 35.8 kg/m. GEN: Well nourished, obese in no acute distress HEENT: normal Neck: no JVD, carotid bruits, or masses Cardiac: RRR; no murmurs, rubs, or gallops,no edema  Respiratory:  clear to auscultation bilaterally, normal work of breathing GI: soft, nontender, nondistended, + BS MS: no deformity or atrophy Skin: warm and dry, no rash Neuro:  Strength and sensation are intact Psych: euthymic mood, full affect   EKG:  EKG is ordered today. The ekg ordered today demonstrates NSR rate 83. Normal. I have personally reviewed and interpreted this study.    Recent Labs: 11/04/2020: BUN 17; Creatinine, Ser 1.03; Potassium 4.1; Sodium 136 12/28/2020: ALT 43    Lipid Panel    Component Value Date/Time   CHOL 303 (H) 12/28/2020 0915   TRIG 346.0 (H) 12/28/2020  0915   HDL 49.40 12/28/2020 0915   CHOLHDL 6 12/28/2020 0915   VLDL 69.2 (H) 12/28/2020 0915   LDLCALC 202 (H) 08/09/2019 1630   LDLDIRECT 203.0 12/28/2020 0915      Wt Readings from Last 3 Encounters:  03/08/21 264 lb (119.7 kg)  11/04/20 262 lb 6 oz (119 kg)  08/09/19 262 lb 3 oz (118.9 kg)      Other studies Reviewed: Additional studies/ records that were reviewed today include: none. Review of the above records demonstrates: N/A   ASSESSMENT AND PLAN:  1.  Mixed hyperlipidemia with hepatic steatosis. Intolerance to low dose Crestor. Discussed essential component of lifestyle modification. This includes dietary modification with elimination of concentrated sweets and  greasy/fried foods. More of a Mediterranean style diet. Needs 30-45 minutes of aerobic activity daily. Will try Pravastatin 10 mg daily and arrange follow up in our lipid clinic.  2. History of portal vein thrombosis. On chronic coumadin.   Current medicines are reviewed at length with the patient today.  The patient does not have concerns regarding medicines.  The following changes have been made:  add Pravastatin 10 mg daily  Labs/ tests ordered today include: none  Orders Placed This Encounter  Procedures   EKG 12-Lead     Disposition:   FU with lipid clinic in 2 months. Follow up with provider in 6 months.  Signed, Isel Skufca [place], MD  03/08/2021 1:32 PM    [facility] [address], [place], [place], [address] Phone [phone], Fax [phone]

## 2021-03-08 ENCOUNTER — Ambulatory Visit (INDEPENDENT_AMBULATORY_CARE_PROVIDER_SITE_OTHER): Payer: BC Managed Care – PPO | Admitting: Cardiology

## 2021-03-08 ENCOUNTER — Encounter: Payer: Self-pay | Admitting: Cardiology

## 2021-03-08 ENCOUNTER — Other Ambulatory Visit: Payer: Self-pay

## 2021-03-08 VITALS — BP 100/70 | HR 83 | Ht 72.0 in | Wt 264.0 lb

## 2021-03-08 DIAGNOSIS — E782 Mixed hyperlipidemia: Secondary | ICD-10-CM

## 2021-03-08 DIAGNOSIS — K760 Fatty (change of) liver, not elsewhere classified: Secondary | ICD-10-CM

## 2021-03-08 MED ORDER — PRAVASTATIN SODIUM 20 MG PO TABS: 10.0000 mg | ORAL_TABLET | Freq: Every day | ORAL | 11 refills | Status: DC

## 2021-03-08 NOTE — Patient Instructions (Signed)
Add Pravastatin 10 mg daily  We will arrange follow up in our lipid clinic in 2 months.

## 2021-03-16 ENCOUNTER — Ambulatory Visit: Payer: BC Managed Care – PPO

## 2021-03-17 ENCOUNTER — Other Ambulatory Visit: Payer: Self-pay | Admitting: Family Medicine

## 2021-03-17 DIAGNOSIS — Z7901 Long term (current) use of anticoagulants: Secondary | ICD-10-CM

## 2021-03-17 NOTE — Telephone Encounter (Signed)
Contacted pt to RS his missed apt from yesterday. Pt has been RS for next wk for coumadin clinic. Last PCP apt 11/04/20 Sent in script

## 2021-03-23 ENCOUNTER — Ambulatory Visit: Payer: BC Managed Care – PPO

## 2021-03-25 ENCOUNTER — Ambulatory Visit: Payer: BC Managed Care – PPO

## 2021-04-26 ENCOUNTER — Other Ambulatory Visit: Payer: Self-pay | Admitting: Family Medicine

## 2021-04-26 DIAGNOSIS — Z7901 Long term (current) use of anticoagulants: Secondary | ICD-10-CM

## 2021-04-26 NOTE — Telephone Encounter (Signed)
Will route to coumadin nurse  

## 2021-04-26 NOTE — Telephone Encounter (Signed)
Pt is 5 wks over due for coumadin clinic apt and has NS the last several that have been scheduled. LVM

## 2021-04-28 NOTE — Telephone Encounter (Signed)
LVM Sent in coumadin supply for 2 wks of dosing

## 2021-05-10 ENCOUNTER — Ambulatory Visit: Payer: BC Managed Care – PPO

## 2021-05-11 NOTE — Telephone Encounter (Signed)
LVM for pt to call to RS. Pt has not been seen since 7/12 and has NS 3 apts since that apt.

## 2021-05-19 NOTE — Telephone Encounter (Signed)
Contacted pt and advised importance of coumadin clinic apt. He reports it is hard with his work schedule. Advised this nurse could get him in before 8am or as late as 430 if that helped. Pt agreed to come to [facility] tomorrow at 830. He said he would do his best. Advised PCP will not continue to refill medication if he does not keep apts. Pt verbalized understanding.

## 2021-05-20 ENCOUNTER — Other Ambulatory Visit: Payer: Self-pay

## 2021-05-20 ENCOUNTER — Telehealth: Payer: Self-pay

## 2021-05-20 ENCOUNTER — Ambulatory Visit (INDEPENDENT_AMBULATORY_CARE_PROVIDER_SITE_OTHER): Payer: BC Managed Care – PPO

## 2021-05-20 DIAGNOSIS — Z7901 Long term (current) use of anticoagulants: Secondary | ICD-10-CM

## 2021-05-20 LAB — POCT INR: INR: 2.5 (ref 2.0–3.0)

## 2021-05-20 MED ORDER — WARFARIN SODIUM 6 MG PO TABS: ORAL_TABLET | ORAL | 0 refills | Status: DC

## 2021-05-20 NOTE — Progress Notes (Signed)
Take 1 tablet daily except nothing on Wednesdays.  Re-check in 6 weeks.

## 2021-05-20 NOTE — Telephone Encounter (Signed)
Pt in today for coumadin clinic apt. Discussed trying to get pt a home INR machine through his insurance. Pt would like to move forward with looking into that. Advised pt [employer] will be contacting him concerning the machine. Pt verbalized undestanding.  [employer] order form completed and scanned in to email. Emailed form to Dr. Royden Purl CMA, [place], and form is pending signature. [place] will email back once Dr. Milinda Antis has signed it.

## 2021-05-20 NOTE — Patient Instructions (Addendum)
Pre visit review using our clinic review tool, if applicable. No additional management support is needed unless otherwise documented below in the visit note.   Take 1 tablet daily except nothing on Wednesdays.  Re-check in 6 weeks.

## 2021-05-21 NOTE — Telephone Encounter (Signed)
Order has been signed. Faxed to number on form. Added pt demographics and insurance card.

## 2021-05-31 NOTE — Telephone Encounter (Signed)
Contacted [employer] rep, Cathy, for update to order. She is not sure but will check into it and f/u with this nurse.

## 2021-06-15 NOTE — Telephone Encounter (Addendum)
Per [employer], they contacted pt oin 11/29 and gave pt insurance info. Pt requested a breakdown of what his insurance will pay and what he has to pay to be emailed to him. Pt is to respond to them when ready. [employer] said they will f/u with the pt if they do not hear from the pt in a few days. Number for pt to reach [employer] is [phone], option 1.

## 2021-06-20 ENCOUNTER — Other Ambulatory Visit: Payer: Self-pay | Admitting: Family Medicine

## 2021-06-20 DIAGNOSIS — Z7901 Long term (current) use of anticoagulants: Secondary | ICD-10-CM

## 2021-06-21 NOTE — Telephone Encounter (Signed)
Pt has been compliant with coumadin dosing. Pt did make his last apt and is scheduled for 12/15 for next coumadin clinic apt. Sent in script

## 2021-06-24 ENCOUNTER — Other Ambulatory Visit: Payer: Self-pay

## 2021-06-24 ENCOUNTER — Ambulatory Visit (INDEPENDENT_AMBULATORY_CARE_PROVIDER_SITE_OTHER): Payer: BC Managed Care – PPO

## 2021-06-24 DIAGNOSIS — Z7901 Long term (current) use of anticoagulants: Secondary | ICD-10-CM

## 2021-06-24 LAB — POCT INR: INR: 1.4 — AB (ref 2.0–3.0)

## 2021-06-24 NOTE — Progress Notes (Signed)
Pt missed dose on Sun but took the dose he missed yesterday. Pt has been stable on this weekly dose so no changes to weekly dosing have been made. Pt was given instructions to boost dose today. Pt verbalized understanding.  Increase  dose today to take 1 1/2 tablets and then continue 1 tablet daily except nothing on Wednesdays.  Re-check in 4 weeks.

## 2021-06-24 NOTE — Patient Instructions (Addendum)
Pre visit review using our clinic review tool, if applicable. No additional management support is needed unless otherwise documented below in the visit note.   Increase  dose today to take 1 1/2 tablets and then continue 1 tablet daily except nothing on Wednesdays.  Re-check in 4 weeks.

## 2021-06-24 NOTE — Telephone Encounter (Signed)
Pt in today for INR check.  Pt reports [employer], home INR monitoring company, did contact him about a home INR machine but he asked them to email him further info about what the cost would be for him and how much the insurance company will pay for. He reports they did not explain that clearly over the phone so he asked for the info in an email. He reports he has not received the email and asked that this office f/u with [employer] for the email. Advised this nurse would contact them and advise you still need the email.   Sent an email to [employer] asking for assistance with the email for this pt.

## 2021-06-24 NOTE — Progress Notes (Signed)
Medical treatment/procedure(s) were performed by non-physician practitioner and as supervising physician I was immediately available for consultation/collaboration. I agree with above. Tracye Szuch A Aveline Daus, MD  

## 2021-07-20 ENCOUNTER — Ambulatory Visit (INDEPENDENT_AMBULATORY_CARE_PROVIDER_SITE_OTHER): Payer: BC Managed Care – PPO

## 2021-07-20 ENCOUNTER — Other Ambulatory Visit: Payer: Self-pay

## 2021-07-20 ENCOUNTER — Ambulatory Visit: Payer: BC Managed Care – PPO

## 2021-07-20 DIAGNOSIS — Z7901 Long term (current) use of anticoagulants: Secondary | ICD-10-CM | POA: Diagnosis not present

## 2021-07-20 LAB — POCT INR: INR: 2 (ref 2.0–3.0)

## 2021-07-20 NOTE — Patient Instructions (Addendum)
Pre visit review using our clinic review tool, if applicable. No additional management support is needed unless otherwise documented below in the visit note.  Continue 1 tablet daily except nothing on Wednesdays and 1/2 tablet on Saturdays.  Re-check in 6 weeks.

## 2021-07-20 NOTE — Progress Notes (Signed)
Medical treatment/procedure(s) were performed by non-physician practitioner and as supervising physician I was immediately available for consultation/collaboration. I agree with above. Mitzi Lilja A Sherran Margolis, MD  

## 2021-07-20 NOTE — Progress Notes (Addendum)
Continue 1 tablet daily except nothing on Wednesdays and take 1/2 tablet on Saturdays. Re-check in 6 weeks.

## 2021-07-23 ENCOUNTER — Other Ambulatory Visit: Payer: Self-pay | Admitting: Family Medicine

## 2021-07-23 DIAGNOSIS — Z7901 Long term (current) use of anticoagulants: Secondary | ICD-10-CM

## 2021-07-23 NOTE — Telephone Encounter (Signed)
Pt is compliant with coumadin management and PCP apts. Sent in refill.  

## 2021-08-24 ENCOUNTER — Other Ambulatory Visit: Payer: Self-pay | Admitting: Family Medicine

## 2021-08-24 DIAGNOSIS — Z7901 Long term (current) use of anticoagulants: Secondary | ICD-10-CM

## 2021-08-25 NOTE — Telephone Encounter (Signed)
Pt has been compliant with warfarin management and PCP apts. Sent in refill.

## 2021-08-31 ENCOUNTER — Other Ambulatory Visit: Payer: Self-pay

## 2021-08-31 ENCOUNTER — Ambulatory Visit (INDEPENDENT_AMBULATORY_CARE_PROVIDER_SITE_OTHER): Payer: BC Managed Care – PPO

## 2021-08-31 DIAGNOSIS — Z7901 Long term (current) use of anticoagulants: Secondary | ICD-10-CM | POA: Diagnosis not present

## 2021-08-31 LAB — POCT INR: INR: 2 (ref 2.0–3.0)

## 2021-08-31 NOTE — Patient Instructions (Addendum)
Pre visit review using our clinic review tool, if applicable. No additional management support is needed unless otherwise documented below in the visit note.   Change weekly dose to take 6mg  (1 tablet) daily except take 0 mg on Wednesdays.   Re-check in 4 weeks.

## 2021-08-31 NOTE — Progress Notes (Signed)
Pt has had last three INRs subtherapeutic, with the last 2 at 2.0 Advised pt it is best to be in the midrange of 2-3 and would recommend a change even though he is in range. Pt agreed and a change to weekly dose has been completed. Change weekly dose to take 6mg  (1 tablet) daily except take 0 mg on Wednesdays.   Re-check in 4 weeks.

## 2021-08-31 NOTE — Progress Notes (Signed)
Patient ID: RYE SNAPE, male   DOB: 07/28/1970, 51 y.o.   MRN: [id number]  Medical screening examination/treatment/procedure(s) were performed by non-physician practitioner and as supervising physician I was immediately available for consultation/collaboration.  I agree with above. Cathlean Cower, MD

## 2021-08-31 NOTE — Progress Notes (Unsigned)
Cardiology Office Note   Date:  08/31/2021   ID:  Julia, Kulzer 11-16-1970, MRN [id number]  PCP:  Judy Pimple, MD  Cardiologist:   Henretta Quist [place], MD   No chief complaint on file.     History of Present Illness: [name] [name] is a 51 y.o. male who is seen at the request of Dr Milinda Antis for evaluation of CV risk and hyperlipidemia. He has a history of portal vein thrombosis and is on chronic Coumadin. History of obesity and hepatic steatosis. He reports he has gained 30 lbs in the last 2-3 years. Not very active. Eats too many desserts. Cholesterol has been high for some time with LDL from 176-200 over the past 8 years and elevated triglycerides as well. Was placed on Crestor but even at 5 mg twice a week he had myalgias and stopped the medication. He has no known history of vascular disease. No family history of premature vascular disease. Denies any chest pain or dyspnea.    Past Medical History:  Diagnosis Date   Heartburn    Hepatic steatosis    asymptomatic   Kidney stones    Portal vein thrombosis     Past Surgical History:  Procedure Laterality Date   ANTERIOR CRUCIATE LIGAMENT REPAIR  12/2005   left    LASIK     left eye     Current Outpatient Medications  Medication Sig Dispense Refill   pravastatin (PRAVACHOL) 20 MG tablet Take 0.5 tablets (10 mg total) by mouth daily. 30 tablet 11   warfarin (COUMADIN) 6 MG tablet TAKE 1 TABLET DAILY BY MOUTH DAILY EXCEPT TAKE ZERO TABLETS ON WEDNESDAYS AND 1/2 TABLET ON SATURDAYS OR AS DIRECTED BY ANTICOAGULATION CLINIC 30 tablet 1   No current facility-administered medications for this visit.    Allergies:   Patient has no known allergies.    Social History:  The patient  reports that he has never smoked. He has never used smokeless tobacco. He reports current alcohol use. He reports that he does not use drugs.   Family History:  The patient's family history includes Deep vein thrombosis in his maternal aunt;  Dementia in his father; Diabetes in his maternal grandmother; Gallbladder disease in his father; Hyperlipidemia in his mother; Stroke in his maternal grandmother.    ROS:  Please see the history of present illness.   Otherwise, review of systems are positive for none.   All other systems are reviewed and negative.    PHYSICAL EXAM: VS:  There were no vitals taken for this visit. , BMI There is no height or weight on file to calculate BMI. GEN: Well nourished, obese in no acute distress HEENT: normal Neck: no JVD, carotid bruits, or masses Cardiac: RRR; no murmurs, rubs, or gallops,no edema  Respiratory:  clear to auscultation bilaterally, normal work of breathing GI: soft, nontender, nondistended, + BS MS: no deformity or atrophy Skin: warm and dry, no rash Neuro:  Strength and sensation are intact Psych: euthymic mood, full affect   EKG:  EKG is ordered today. The ekg ordered today demonstrates NSR rate 83. Normal. I have personally reviewed and interpreted this study.    Recent Labs: 11/04/2020: BUN 17; Creatinine, Ser 1.03; Potassium 4.1; Sodium 136 12/28/2020: ALT 43    Lipid Panel    Component Value Date/Time   CHOL 303 (H) 12/28/2020 0915   TRIG 346.0 (H) 12/28/2020 0915   HDL 49.40 12/28/2020 0915   CHOLHDL 6 12/28/2020 0915  VLDL 69.2 (H) 12/28/2020 0915   LDLCALC 202 (H) 08/09/2019 1630   LDLDIRECT 203.0 12/28/2020 0915      Wt Readings from Last 3 Encounters:  03/08/21 264 lb (119.7 kg)  11/04/20 262 lb 6 oz (119 kg)  08/09/19 262 lb 3 oz (118.9 kg)      Other studies Reviewed: Additional studies/ records that were reviewed today include: none. Review of the above records demonstrates: N/A   ASSESSMENT AND PLAN:  1.  Mixed hyperlipidemia with hepatic steatosis. Intolerance to low dose Crestor. Discussed essential component of lifestyle modification. This includes dietary modification with elimination of concentrated sweets and greasy/fried foods. More  of a Mediterranean style diet. Needs 30-45 minutes of aerobic activity daily. Will try Pravastatin 10 mg daily and arrange follow up in our lipid clinic.  2. History of portal vein thrombosis. On chronic coumadin.   Current medicines are reviewed at length with the patient today.  The patient does not have concerns regarding medicines.  The following changes have been made:  add Pravastatin 10 mg daily  Labs/ tests ordered today include: none  No orders of the defined types were placed in this encounter.    Disposition:   FU with lipid clinic in 2 months. Follow up with provider in 6 months.  Signed, Collin Rengel [place], MD  08/31/2021 7:44 AM    [facility] [address] [place], [place], [address] Phone [phone], Fax [phone]

## 2021-09-07 ENCOUNTER — Ambulatory Visit: Payer: BC Managed Care – PPO | Admitting: Cardiology

## 2021-10-01 ENCOUNTER — Other Ambulatory Visit: Payer: Self-pay

## 2021-10-01 ENCOUNTER — Ambulatory Visit (INDEPENDENT_AMBULATORY_CARE_PROVIDER_SITE_OTHER): Payer: BC Managed Care – PPO

## 2021-10-01 DIAGNOSIS — Z7901 Long term (current) use of anticoagulants: Secondary | ICD-10-CM | POA: Diagnosis not present

## 2021-10-01 LAB — POCT INR: INR: 1.7 — AB (ref 2.0–3.0)

## 2021-10-01 NOTE — Progress Notes (Signed)
Increase today to take 9 mg and then change weekly dose to take 6mg  (1 tablet) daily except take 0 mg on Wednesdays.   Re-check in 4 weeks.  ?

## 2021-10-01 NOTE — Patient Instructions (Addendum)
Pre visit review using our clinic review tool, if applicable. No additional management support is needed unless otherwise documented below in the visit note. ? ?Increase today to take 9 mg and then change weekly dose to take 6mg  (1 tablet) daily except take 0 mg on Wednesdays.   Re-check in 4 weeks.  ?

## 2021-10-29 ENCOUNTER — Ambulatory Visit (INDEPENDENT_AMBULATORY_CARE_PROVIDER_SITE_OTHER): Payer: BC Managed Care – PPO

## 2021-10-29 DIAGNOSIS — Z7901 Long term (current) use of anticoagulants: Secondary | ICD-10-CM

## 2021-10-29 LAB — POCT INR: INR: 1.6 — AB (ref 2.0–3.0)

## 2021-10-29 NOTE — Patient Instructions (Addendum)
Pre visit review using our clinic review tool, if applicable. No additional management support is needed unless otherwise documented below in the visit note. ? ?Increase today to take 9 mg  and increase dose tomorrow to to take 9 mg and then change weekly dose to take 6mg  (1 tablet) daily except take 3 mg on Wednesdays.   Re-check in 4 weeks.  ?

## 2021-10-29 NOTE — Progress Notes (Addendum)
Increase today to take 9 mg  and increase dose tomorrow to to take 9 mg and then change weekly dose to take 6mg  (1 tablet) daily except take 3 mg on Wednesdays.   Re-check in 4 weeks per pt request. ?

## 2021-11-26 ENCOUNTER — Ambulatory Visit: Payer: BC Managed Care – PPO

## 2021-11-30 ENCOUNTER — Ambulatory Visit (INDEPENDENT_AMBULATORY_CARE_PROVIDER_SITE_OTHER): Payer: BC Managed Care – PPO

## 2021-11-30 DIAGNOSIS — Z7901 Long term (current) use of anticoagulants: Secondary | ICD-10-CM

## 2021-11-30 LAB — POCT INR: INR: 2.3 (ref 2.0–3.0)

## 2021-11-30 NOTE — Patient Instructions (Addendum)
Pre visit review using our clinic review tool, if applicable. No additional management support is needed unless otherwise documented below in the visit note.  Continue  6mg  (1 tablet) daily except take 0 mg on Saturdays.   Re-check in 2 weeks per pt request.

## 2021-11-30 NOTE — Progress Notes (Addendum)
Pt reported he did not follow instructions given at last visit and thought he was supposed to take 6 mg daily except take 0 mg on Saturdays, but he is not sure, but knows he was not taking any 1/2 tablets. Pt is in range today so pt will continue what he was taking since last apt and recheck in 2 wks to assure he is still in range. Pt voiced understanding of instructions at this visit. Calendar has been updated. Continue  6mg  (1 tablet) daily except take 0 mg on Saturdays.   Re-check in 2 weeks.

## 2021-12-14 ENCOUNTER — Ambulatory Visit (INDEPENDENT_AMBULATORY_CARE_PROVIDER_SITE_OTHER): Payer: BC Managed Care – PPO

## 2021-12-14 DIAGNOSIS — Z7901 Long term (current) use of anticoagulants: Secondary | ICD-10-CM | POA: Diagnosis not present

## 2021-12-14 LAB — POCT INR: INR: 2.6 (ref 2.0–3.0)

## 2021-12-14 NOTE — Patient Instructions (Addendum)
Pre visit review using our clinic review tool, if applicable. No additional management support is needed unless otherwise documented below in the visit note.  Continue  6mg (1 tablet) daily except take 0 mg onvWednesdays. Re-check in 6 weeks. 

## 2021-12-14 NOTE — Progress Notes (Signed)
Continue  6mg (1 tablet) daily except take 0 mg onvWednesdays.   Re-check in 6 weeks. 

## 2022-01-25 ENCOUNTER — Ambulatory Visit (INDEPENDENT_AMBULATORY_CARE_PROVIDER_SITE_OTHER): Payer: BC Managed Care – PPO

## 2022-01-25 DIAGNOSIS — Z7901 Long term (current) use of anticoagulants: Secondary | ICD-10-CM | POA: Diagnosis not present

## 2022-01-25 LAB — POCT INR: INR: 2.7 (ref 2.0–3.0)

## 2022-01-25 NOTE — Progress Notes (Signed)
Continue  6mg  (1 tablet) daily except take 0 mg onvWednesdays.   Re-check in 6 weeks.

## 2022-01-25 NOTE — Patient Instructions (Addendum)
Pre visit review using our clinic review tool, if applicable. No additional management support is needed unless otherwise documented below in the visit note.  Continue  6mg (1 tablet) daily except take 0 mg onvWednesdays. Re-check in 6 weeks. 

## 2022-03-08 ENCOUNTER — Ambulatory Visit: Payer: BC Managed Care – PPO

## 2022-03-18 ENCOUNTER — Ambulatory Visit: Payer: BC Managed Care – PPO

## 2022-03-21 ENCOUNTER — Encounter: Payer: Self-pay | Admitting: Family Medicine

## 2022-03-21 ENCOUNTER — Telehealth: Payer: Self-pay | Admitting: Student

## 2022-03-21 ENCOUNTER — Ambulatory Visit (INDEPENDENT_AMBULATORY_CARE_PROVIDER_SITE_OTHER): Payer: BC Managed Care – PPO

## 2022-03-21 ENCOUNTER — Ambulatory Visit (INDEPENDENT_AMBULATORY_CARE_PROVIDER_SITE_OTHER): Payer: BC Managed Care – PPO | Admitting: Family Medicine

## 2022-03-21 VITALS — BP 132/88 | HR 115 | Temp 98.1°F | Wt 262.0 lb

## 2022-03-21 DIAGNOSIS — R062 Wheezing: Secondary | ICD-10-CM

## 2022-03-21 DIAGNOSIS — Z7901 Long term (current) use of anticoagulants: Secondary | ICD-10-CM

## 2022-03-21 DIAGNOSIS — R051 Acute cough: Secondary | ICD-10-CM

## 2022-03-21 DIAGNOSIS — T50905D Adverse effect of unspecified drugs, medicaments and biological substances, subsequent encounter: Secondary | ICD-10-CM | POA: Diagnosis not present

## 2022-03-21 DIAGNOSIS — J189 Pneumonia, unspecified organism: Secondary | ICD-10-CM

## 2022-03-21 DIAGNOSIS — I81 Portal vein thrombosis: Secondary | ICD-10-CM

## 2022-03-21 DIAGNOSIS — R000 Tachycardia, unspecified: Secondary | ICD-10-CM

## 2022-03-21 LAB — POCT INR: INR: 1.4 — AB (ref 2.0–3.0)

## 2022-03-21 MED ORDER — PREDNISONE 20 MG PO TABS: ORAL_TABLET | ORAL | 0 refills | Status: DC

## 2022-03-21 NOTE — Telephone Encounter (Signed)
Request transfer to another [facility] [place] [facility] PCP  Pt is requesting to transfer FROM:  Dr. Milinda Antis  Pt is requesting to transfer TO: Dr. Claiborne Billings  Reason for requested transfer: Relocated and closer to this location Best contact number:  [phone]

## 2022-03-21 NOTE — Telephone Encounter (Signed)
That is fine with me if ok with that provider

## 2022-03-21 NOTE — Progress Notes (Signed)
[name] [name] , [birth year], 51 y.o., male MRN: [id number] Patient Care Team    Relationship Specialty Notifications Start End  Tower, Audrie Gallus, MD PCP - General Family Medicine  03/16/12     Chief Complaint  Patient presents with   Pneumonia    Pt reports PNA x 2 weeks; pt c/o chest wall pain from coughing and was prescribed pred and abx; pt accidentally threw away the steroid after having reaction to first abx      Subjective: [name] [name] is a 51 y.o. male Pt presents for an OV with complaints of worsening cough of 2 weeks duration.  Associated symptoms include fatigue, dry cough and chills. He was seen in [facility] and dx with pna LLLL. No xray or labs collected. Doxy and prednisone initially prescribed and pt had hives with doxy use. Doxy switched to Augmentin and pt has been tolerating. He was prescribed prednisone, but accidentally threw it away instead of the doxy.  Patient reports he has been extremely fatigued over the last 2 weeks. He denies history of asthma or COPD.  He has never smoked. He does have a history of blood clot in which he takes warfarin daily.  He reports his warfarin is typically always at goal.  Per EMR review there was 2 occasions this year where it was just shy of goal.  He denies any missed doses of warfarin.  He does admit he has not been to the Coumadin clinic in a little over 2 weeks due to rescheduling of the appointments. He denies any lower extremity swelling, redness or pain.      11/04/2020    9:26 AM 08/09/2019    4:19 PM 07/24/2017    4:45 PM  Depression screen PHQ 2/9  Decreased Interest 0 0 0  Down, Depressed, Hopeless 0 0 0  PHQ - 2 Score 0 0 0  Altered sleeping 0 0   Tired, decreased energy 0 1   Change in appetite 0 1   Feeling bad or failure about yourself  0 0   Trouble concentrating 0 0   Moving slowly or fidgety/restless 0 0   Suicidal thoughts 0 0   PHQ-9 Score 0 2   Difficult doing work/chores Not difficult at all Not difficult  at all     Allergies  Allergen Reactions   Bee Venom Anaphylaxis   Doxycycline Hives   Social History   Social History Narrative   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   Has one partner, live together    No children   Completed 12th grade education   No regular exercise   Past Medical History:  Diagnosis Date   Heartburn    Hepatic steatosis    asymptomatic   Kidney stones    Portal vein thrombosis    Past Surgical History:  Procedure Laterality Date   ANTERIOR CRUCIATE LIGAMENT REPAIR  12/2005   left    LASIK     left eye   Family History  Problem Relation Age of Onset   Hyperlipidemia Mother    Gallbladder disease Father    Dementia Father    Deep vein thrombosis Maternal Aunt    Diabetes Maternal Grandmother    Stroke Maternal Grandmother    Colon cancer Neg Hx    Allergies as of 03/21/2022       Reactions   Bee Venom Anaphylaxis   Doxycycline Hives  Medication List        Accurate as of March 21, 2022  5:36 PM. If you have any questions, ask your nurse or doctor.          albuterol 108 (90 Base) MCG/ACT inhaler Commonly known as: VENTOLIN HFA Inhale 2 puffs into the lungs every 4 (four) hours as needed.   amoxicillin-clavulanate 875-125 MG tablet Commonly known as: AUGMENTIN Take 1 tablet by mouth 2 (two) times daily.   pravastatin 20 MG tablet Commonly known as: Pravachol Take 0.5 tablets (10 mg total) by mouth daily.   predniSONE 20 MG tablet Commonly known as: DELTASONE 60 mg x3d, 40 mg x3d, 20 mg x2d, 10 mg x2d Started by: Felix Pacini, DO   warfarin 6 MG tablet Commonly known as: COUMADIN Take as directed by the anticoagulation clinic. If you are unsure how to take this medication, talk to your nurse or doctor. Original instructions: TAKE 1 TABLET DAILY BY MOUTH DAILY EXCEPT TAKE ZERO TABLETS ON WEDNESDAYS AND 1/2 TABLET ON SATURDAYS OR AS DIRECTED BY ANTICOAGULATION CLINIC        All past medical  history, surgical history, allergies, family history, immunizations andmedications were updated in the EMR today and reviewed under the history and medication portions of their EMR.     ROS Negative, with the exception of above mentioned in HPI   Objective:  BP 132/88   Pulse (!) 115   Temp 98.1 F (36.7 C) (Oral)   Wt 262 lb (118.8 kg)   SpO2 98%   BMI 35.53 kg/m  Body mass index is 35.53 kg/m. Physical Exam Vitals and nursing note reviewed.  Constitutional:      General: He is not in acute distress.    Appearance: Normal appearance. He is not ill-appearing, toxic-appearing or diaphoretic.  HENT:     Head: Normocephalic and atraumatic.     Right Ear: Tympanic membrane and ear canal normal.     Left Ear: Tympanic membrane and ear canal normal.     Nose: Congestion present. No rhinorrhea.     Mouth/Throat:     Mouth: Mucous membranes are moist.     Pharynx: No oropharyngeal exudate or posterior oropharyngeal erythema.  Eyes:     General: No scleral icterus.       Right eye: No discharge.        Left eye: No discharge.     Extraocular Movements: Extraocular movements intact.     Pupils: Pupils are equal, round, and reactive to light.  Cardiovascular:     Rate and Rhythm: Regular rhythm. Tachycardia present.  Pulmonary:     Effort: No respiratory distress.     Breath sounds: No stridor. Wheezing and rhonchi present.  Chest:     Chest wall: Tenderness present.  Musculoskeletal:     Cervical back: Neck supple. No tenderness.     Right lower leg: No edema.     Left lower leg: No edema.  Lymphadenopathy:     Cervical: No cervical adenopathy.  Skin:    General: Skin is warm and dry.     Coloration: Skin is not jaundiced or pale.     Findings: No rash.  Neurological:     Mental Status: He is alert and oriented to person, place, and time. Mental status is at baseline.  Psychiatric:        Mood and Affect: Mood normal.        Behavior: Behavior normal.        Thought  Content: Thought content normal.        Judgment: Judgment normal.     No results found. No results found. No results found for this or any previous visit (from the past 24 hour(s)).  Assessment/Plan: [name] [name] is a 51 y.o. male present for OV for  Wheezing -DuoNeb treatment provided today - DG Chest 2 View; Future  Adverse effect of drug, subsequent encounter Hives develop secondary to doxycycline use. Added doxycycline to his drug intolerance today.  Tachycardia/portal vein thrombosis/long-term use of could not to coagulate Patient is significantly tachycardic on exam today and during urgent care visits per EMR review. Concerning tachycardic with lower oxygen levels than baseline with his history. - D-dimer, quantitative - Protime-INR> had been on Doxy few times this week. Acute cough-pneumonia - DG Chest 2 View; Future - CBC w/Diff Oxygen saturation lower than his normal at 94% today. > Duoneb tx>98% temporarily, but continued to drop to 92% on occasion. Rest, hydrate.  mucinex (DM if cough) Continue augmentin Restart prednisone taper Finish augmentin prescribed by UC.   Continue albuterol 1 to 2 puffs every 4-6 hours as needed for wheezing/chest tightness. Cxr ordered today. CBC, PT/INR and D-dimer collected today Patient will be called with results and further plan discussed at that time  Reviewed expectations re: course of current medical issues. Discussed self-management of symptoms. Outlined signs and symptoms indicating need for more acute intervention. Patient verbalized understanding and all questions were answered. Patient received an After-Visit Summary.    Orders Placed This Encounter  Procedures   DG Chest 2 View   D-dimer, quantitative   Protime-INR   CBC w/Diff   Meds ordered this encounter  Medications   predniSONE (DELTASONE) 20 MG tablet    Sig: 60 mg x3d, 40 mg x3d, 20 mg x2d, 10 mg x2d    Dispense:  18 tablet    Refill:  0    Referral Orders  No referral(s) requested today     Note is dictated utilizing voice recognition software. Although note has been proof read prior to signing, occasional typographical errors still can be missed. If any questions arise, please do not hesitate to call for verification.   electronically signed by:  Felix Pacini, DO  [facility] [place]

## 2022-03-22 ENCOUNTER — Ambulatory Visit: Payer: BC Managed Care – PPO

## 2022-03-22 ENCOUNTER — Telehealth: Payer: Self-pay | Admitting: Family Medicine

## 2022-03-22 ENCOUNTER — Ambulatory Visit (INDEPENDENT_AMBULATORY_CARE_PROVIDER_SITE_OTHER): Payer: BC Managed Care – PPO

## 2022-03-22 DIAGNOSIS — Z7901 Long term (current) use of anticoagulants: Secondary | ICD-10-CM

## 2022-03-22 LAB — CBC WITH DIFFERENTIAL/PLATELET
Absolute Monocytes: 1082 cells/uL — ABNORMAL HIGH (ref 200–950)
Basophils Absolute: 94 cells/uL (ref 0–200)
Basophils Relative: 0.9 %
Eosinophils Absolute: 177 cells/uL (ref 15–500)
Eosinophils Relative: 1.7 %
HCT: 49.4 % (ref 38.5–50.0)
Hemoglobin: 16.9 g/dL (ref 13.2–17.1)
Lymphs Abs: 3380 cells/uL (ref 850–3900)
MCH: 33.9 pg — ABNORMAL HIGH (ref 27.0–33.0)
MCHC: 34.2 g/dL (ref 32.0–36.0)
MCV: 99.2 fL (ref 80.0–100.0)
MPV: 10.1 fL (ref 7.5–12.5)
Monocytes Relative: 10.4 %
Neutro Abs: 5668 cells/uL (ref 1500–7800)
Neutrophils Relative %: 54.5 %
Platelets: 320 10*3/uL (ref 140–400)
RBC: 4.98 10*6/uL (ref 4.20–5.80)
RDW: 12.8 % (ref 11.0–15.0)
Total Lymphocyte: 32.5 %
WBC: 10.4 10*3/uL (ref 3.8–10.8)

## 2022-03-22 LAB — PROTIME-INR
INR: 1.4 — ABNORMAL HIGH
Prothrombin Time: 14.9 s — ABNORMAL HIGH (ref 9.0–11.5)

## 2022-03-22 LAB — D-DIMER, QUANTITATIVE: D-Dimer, Quant: 0.42 mcg/mL FEU (ref <0.50)

## 2022-03-22 NOTE — Progress Notes (Signed)
Pt reports he was diagnosed with pneumonia on 9/8 and was prescribed prednisone x 10 days and doxycycline but had to stop the doxycycline due to allergy to medication. They then prescribed amoxicillin. Pt reports he has not missed any doses.  Pt has been stable on current dose for several months so with interaction of medication prescribed no long term dosing change will be made.  Increase dose today to take 9 mg and increase dose tomorrow to 6 mg and then continue  6mg  (1 tablet) daily except take 0 mg onvWednesdays.   Re-check in 2 weeks.

## 2022-03-22 NOTE — Telephone Encounter (Signed)
Please call patient His INR is 1.4.  I encouraged him to share this with his Coumadin nurse, it is below therapy. Fortunately his D-dimer, which is a test completed to rule out blood clots.  His D-dimer is negative, therefore blood clot is less likely the cause of his shortness of breath despite his low INR.  Chest x-ray results: Did not show evidence of pneumonia.  Therefore this is most likely a bad case of bronchitis at this point.  Continue medications as prescribed and the steroid.  If symptoms are not improving in 2 weeks, follow-up at that time.

## 2022-03-22 NOTE — Patient Instructions (Addendum)
Pre visit review using our clinic review tool, if applicable. No additional management support is needed unless otherwise documented below in the visit note.  Increase dose today to take 9 mg and increase dose tomorrow to 6 mg and then continue  6mg  (1 tablet) daily except take 0 mg onvWednesdays.   Re-check in 2 weeks.

## 2022-03-22 NOTE — Telephone Encounter (Signed)
Pt will like know how long he will need to be out of work

## 2022-03-22 NOTE — Telephone Encounter (Signed)
Please assist pt with NP

## 2022-03-22 NOTE — Telephone Encounter (Signed)
If he is feeling he can, then he can return tomorrow.

## 2022-03-22 NOTE — Telephone Encounter (Signed)
We will cover him out through this Wednesday, return on Thursday.

## 2022-03-22 NOTE — Telephone Encounter (Signed)
Okay with me 

## 2022-03-22 NOTE — Telephone Encounter (Signed)
Spoke with pt regarding labs and instructions.   

## 2022-03-22 NOTE — Telephone Encounter (Signed)
Pt states that he do not feel that returning tomorrow is a great idea but will like to know when to return with doctor's note. Pt will like this so he can file for FMLA.

## 2022-03-22 NOTE — Telephone Encounter (Signed)
Please advise 

## 2022-03-23 NOTE — Telephone Encounter (Signed)
Spoke with pt and informed him of providers instructions. Pt was informed that if he feel like he need to have his work note extended he could always call and sched another appt with current PCP.

## 2022-03-24 ENCOUNTER — Ambulatory Visit: Payer: BC Managed Care – PPO | Admitting: Family Medicine

## 2022-03-24 ENCOUNTER — Ambulatory Visit (HOSPITAL_BASED_OUTPATIENT_CLINIC_OR_DEPARTMENT_OTHER)
Admission: RE | Admit: 2022-03-24 | Discharge: 2022-03-24 | Disposition: A | Discharge status: Home / Self Care | Payer: BC Managed Care – PPO | Source: Ambulatory Visit | Attending: Family Medicine | Admitting: Family Medicine

## 2022-03-24 ENCOUNTER — Encounter: Payer: Self-pay | Admitting: Family Medicine

## 2022-03-24 ENCOUNTER — Ambulatory Visit (INDEPENDENT_AMBULATORY_CARE_PROVIDER_SITE_OTHER): Payer: BC Managed Care – PPO | Admitting: Family Medicine

## 2022-03-24 ENCOUNTER — Other Ambulatory Visit: Payer: Self-pay | Admitting: Family Medicine

## 2022-03-24 VITALS — BP 113/76 | HR 105 | Temp 98.0°F | Ht 72.0 in | Wt 261.0 lb

## 2022-03-24 DIAGNOSIS — I81 Portal vein thrombosis: Secondary | ICD-10-CM | POA: Insufficient documentation

## 2022-03-24 DIAGNOSIS — B9689 Other specified bacterial agents as the cause of diseases classified elsewhere: Secondary | ICD-10-CM | POA: Diagnosis not present

## 2022-03-24 DIAGNOSIS — R0600 Dyspnea, unspecified: Secondary | ICD-10-CM | POA: Diagnosis not present

## 2022-03-24 DIAGNOSIS — R0602 Shortness of breath: Secondary | ICD-10-CM | POA: Diagnosis not present

## 2022-03-24 DIAGNOSIS — R071 Chest pain on breathing: Secondary | ICD-10-CM | POA: Diagnosis not present

## 2022-03-24 DIAGNOSIS — R0689 Other abnormalities of breathing: Secondary | ICD-10-CM | POA: Insufficient documentation

## 2022-03-24 DIAGNOSIS — J988 Other specified respiratory disorders: Secondary | ICD-10-CM | POA: Insufficient documentation

## 2022-03-24 LAB — BASIC METABOLIC PANEL
BUN: 24 mg/dL — ABNORMAL HIGH (ref 6–23)
CO2: 25 mEq/L (ref 19–32)
Calcium: 9 mg/dL (ref 8.4–10.5)
Chloride: 102 mEq/L (ref 96–112)
Creatinine, Ser: 1.09 mg/dL (ref 0.40–1.50)
GFR: 79.65 mL/min (ref >60.00)
Glucose, Bld: 113 mg/dL — ABNORMAL HIGH (ref 70–99)
Potassium: 4 mEq/L (ref 3.5–5.1)
Sodium: 137 mEq/L (ref 135–145)

## 2022-03-24 LAB — BRAIN NATRIURETIC PEPTIDE: Pro B Natriuretic peptide (BNP): 17 pg/mL (ref 0.0–100.0)

## 2022-03-24 LAB — POC COVID19 BINAXNOW: SARS Coronavirus 2 Ag: NEGATIVE

## 2022-03-24 MED ORDER — AZITHROMYCIN 250 MG PO TABS: ORAL_TABLET | ORAL | 0 refills | Status: AC

## 2022-03-24 MED ORDER — BENZONATATE 200 MG PO CAPS: 200.0000 mg | ORAL_CAPSULE | Freq: Three times a day (TID) | ORAL | 0 refills | Status: DC | PRN

## 2022-03-24 MED ORDER — ALBUTEROL SULFATE (2.5 MG/3ML) 0.083% IN NEBU
2.5000 mg | INHALATION_SOLUTION | Freq: Once | RESPIRATORY_TRACT | Status: AC
  Administered 2022-03-24: 2.5 mg via RESPIRATORY_TRACT

## 2022-03-24 MED ORDER — FLUTICASONE PROPIONATE HFA 110 MCG/ACT IN AERO: 2.0000 | INHALATION_SPRAY | Freq: Two times a day (BID) | RESPIRATORY_TRACT | 3 refills | Status: DC

## 2022-03-24 MED ORDER — BUDESONIDE 90 MCG/ACT IN AEPB: 2.0000 | INHALATION_SPRAY | Freq: Two times a day (BID) | RESPIRATORY_TRACT | 2 refills | Status: DC

## 2022-03-24 MED ORDER — IPRATROPIUM-ALBUTEROL 0.5-2.5 (3) MG/3ML IN SOLN
3.0000 mL | Freq: Once | RESPIRATORY_TRACT | Status: AC
  Administered 2022-03-24: 3 mL via RESPIRATORY_TRACT

## 2022-03-24 MED ORDER — IOHEXOL 350 MG/ML SOLN
100.0000 mL | Freq: Once | INTRAVENOUS | Status: AC | PRN
  Administered 2022-03-24: 100 mL via INTRAVENOUS

## 2022-03-24 NOTE — Telephone Encounter (Signed)
Noted  

## 2022-03-24 NOTE — Patient Instructions (Signed)
No follow-ups on file.        Great to see you today.  I have refilled the medication(s) we provide.   If labs were collected, we will inform you of lab results once received either by echart message or telephone call.   - echart message- for normal results that have been seen by the patient already.   - telephone call: abnormal results or if patient has not viewed results in their echart.  

## 2022-03-24 NOTE — Telephone Encounter (Signed)
Called [facility] for the STAT pickup of the respiratory viral panel. Shelly stated that this testing isn't consider as an STAT and will be picked-up when the usual carrier comes and get all the specimens out of the lock box. The confirmation number is [id number]. [facility] will be able to take the STAT labs to [place] lab.

## 2022-03-24 NOTE — Progress Notes (Signed)
[name] [name] , April 29, 1971, 51 y.o., male MRN: [id number] Patient Care Team    Relationship Specialty Notifications Start End  Tower, Audrie Gallus, MD PCP - General Family Medicine  03/16/12     Chief Complaint  Patient presents with   Bronchitis    unchanged     Subjective: [name] [name] is a 51 y.o. male patient presents for follow up resp. Illness. Xray did not show signs of pneumonia. CBC was unremarkable.  D-Dimer was normal.  His INR was subtherapeutic at 1.4, goal of warfarin is 2-3 for his history of thrombosis.  Patient states he is not feeling any improvement and is feeling more short of breath than he had been seen 2 days ago.  He is taking the Augmentin and prednisone, as well as using the albuterol 3 times a day.  He denies any fevers or chills.  He is still tachycardic today.  He has been drinking plenty of water.  He now states he cannot get through a full sentence without feeling winded.  Reports yesterday he became even more weak feeling. Patient reports he has a pulse oximeter at home and he was in the 88-89% range earlier today.  Prior note: Pt presents for an OV with complaints of worsening cough of 2 weeks duration.  Associated symptoms include fatigue, dry cough and chills. He was seen in UCx2 and dx with pna LLLL. No xray or labs collected. Doxy and prednisone initially prescribed and pt had hives with doxy use. Doxy switched to Augmentin and pt has been tolerating. He was prescribed prednisone, but accidentally threw it away instead of the doxy.  Patient reports he has been extremely fatigued over the last 2 weeks. He denies history of asthma or COPD.  He has never smoked. He does have a history of blood clot in which he takes warfarin daily.  He reports his warfarin is typically always at goal.  Per EMR review there was 2 occasions this year where it was just shy of goal.  He denies any missed doses of warfarin.  He does admit he has not been to the Coumadin clinic in  a little over 2 weeks due to rescheduling of the appointments. He denies any lower extremity swelling, redness or pain.      03/24/2022   11:18 AM 11/04/2020    9:26 AM 08/09/2019    4:19 PM 07/24/2017    4:45 PM  Depression screen PHQ 2/9  Decreased Interest 0 0 0 0  Down, Depressed, Hopeless 0 0 0 0  PHQ - 2 Score 0 0 0 0  Altered sleeping 1 0 0   Tired, decreased energy 1 0 1   Change in appetite 0 0 1   Feeling bad or failure about yourself  0 0 0   Trouble concentrating 1 0 0   Moving slowly or fidgety/restless 0 0 0   Suicidal thoughts 0 0 0   PHQ-9 Score 3 0 2   Difficult doing work/chores  Not difficult at all Not difficult at all     Allergies  Allergen Reactions   Bee Venom Anaphylaxis   Doxycycline Hives   Social History   Social History Narrative   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   Has one partner, live together    No children   Completed 12th grade education   No regular exercise   Past Medical History:  Diagnosis Date   Heartburn  Hepatic steatosis    asymptomatic   Kidney stones    Portal vein thrombosis    Past Surgical History:  Procedure Laterality Date   ANTERIOR CRUCIATE LIGAMENT REPAIR  12/2005   left    LASIK     left eye   Family History  Problem Relation Age of Onset   Hyperlipidemia Mother    Gallbladder disease Father    Dementia Father    Deep vein thrombosis Maternal Aunt    Diabetes Maternal Grandmother    Stroke Maternal Grandmother    Colon cancer Neg Hx    Allergies as of 03/24/2022       Reactions   Bee Venom Anaphylaxis   Doxycycline Hives        Medication List        Accurate as of March 24, 2022  1:35 PM. If you have any questions, ask your nurse or doctor.          albuterol 108 (90 Base) MCG/ACT inhaler Commonly known as: VENTOLIN HFA Inhale 2 puffs into the lungs every 4 (four) hours as needed.   amoxicillin-clavulanate 875-125 MG tablet Commonly known as: AUGMENTIN Take 1  tablet by mouth 2 (two) times daily.   azithromycin 250 MG tablet Commonly known as: ZITHROMAX Take 2 tablets on day 1, then 1 tablet daily on days 2 through 5 Started by: Felix Pacini, DO   benzonatate 200 MG capsule Commonly known as: TESSALON Take 1 capsule (200 mg total) by mouth 3 (three) times daily as needed for cough. Started by: Felix Pacini, DO   Budesonide 90 MCG/ACT inhaler Inhale 2 puffs into the lungs 2 (two) times daily. Started by: Felix Pacini, DO   pravastatin 20 MG tablet Commonly known as: Pravachol Take 0.5 tablets (10 mg total) by mouth daily.   predniSONE 20 MG tablet Commonly known as: DELTASONE 60 mg x3d, 40 mg x3d, 20 mg x2d, 10 mg x2d   warfarin 6 MG tablet Commonly known as: COUMADIN Take as directed by the anticoagulation clinic. If you are unsure how to take this medication, talk to your nurse or doctor. Original instructions: TAKE 1 TABLET DAILY BY MOUTH DAILY EXCEPT TAKE ZERO TABLETS ON WEDNESDAYS AND 1/2 TABLET ON SATURDAYS OR AS DIRECTED BY ANTICOAGULATION CLINIC        All past medical history, surgical history, allergies, family history, immunizations andmedications were updated in the EMR today and reviewed under the history and medication portions of their EMR.     ROS Negative, with the exception of above mentioned in HPI   Objective:  BP 113/76   Pulse (!) 105   Temp 98 F (36.7 C) (Oral)   Ht 6' (1.829 m)   Wt 261 lb (118.4 kg)   SpO2 95%   BMI 35.40 kg/m  Body mass index is 35.4 kg/m. Physical Exam Vitals and nursing note reviewed.  Constitutional:      General: He is not in acute distress.    Appearance: Normal appearance. He is not ill-appearing, toxic-appearing or diaphoretic.  HENT:     Head: Normocephalic and atraumatic.     Right Ear: Tympanic membrane and ear canal normal.     Left Ear: Tympanic membrane and ear canal normal.     Nose: Congestion present. No rhinorrhea.     Mouth/Throat:     Mouth: Mucous  membranes are moist.     Pharynx: No oropharyngeal exudate or posterior oropharyngeal erythema.  Eyes:     General: No scleral  icterus.       Right eye: No discharge.        Left eye: No discharge.     Extraocular Movements: Extraocular movements intact.     Pupils: Pupils are equal, round, and reactive to light.  Cardiovascular:     Rate and Rhythm: Regular rhythm. Tachycardia present.  Pulmonary:     Effort: No respiratory distress.     Breath sounds: No stridor. Rhonchi and rales present. No wheezing.     Comments: Appears winded, unable to speak a full sentence without taking breaths. Chest:     Chest wall: Tenderness present.  Musculoskeletal:     Cervical back: Neck supple. No tenderness.     Right lower leg: No edema.     Left lower leg: No edema.  Lymphadenopathy:     Cervical: No cervical adenopathy.  Skin:    General: Skin is warm and dry.     Coloration: Skin is not jaundiced or pale.     Findings: No rash.  Neurological:     Mental Status: He is alert and oriented to person, place, and time. Mental status is at baseline.  Psychiatric:        Mood and Affect: Mood normal.        Behavior: Behavior normal.        Thought Content: Thought content normal.        Judgment: Judgment normal.     No results found. No results found. Results for orders placed or performed in visit on 03/24/22 (from the past 24 hour(s))  POC COVID-19 BinaxNow     Status: Normal   Collection Time: 03/24/22  1:13 PM  Result Value Ref Range   SARS Coronavirus 2 Ag Negative Negative    Assessment/Plan: [name] [name] is a 51 y.o. male present for OV for  Dyspnea with respiratory abnormalities/shortness of breath/tachycardia/history of portal vein thrombosis/long-use of anticoagulant/bronchitis-RAD? Wilber is still tachycardic and he is more winded than 2 days ago when treatment was initiated.  D-dimer had been normal, however his INR was subtherapeutic and he is more symptomatic today.   With his history of thrombosis and subtherapeutic INR, we have to get a CTA to rule out PE with his worsening symptoms. Oxygen saturations are between 90-93% initially, after breathing treatment x95-98% when focusing on taking deep breaths.  When speaking in sentences drops back down to around 90%. - D-dimer, quantitative was normal 2 days ago - Protime-INR>  subtherapeutic at 1.4.>  Coumadin clinic aware - DG Chest 2 View> reported as unremarkable. - CBC w/Diff> unremarkable -COVID-19 test was negative.  Although cannot rule out COVID 2 weeks ago at the onset of the symptoms.  This may be a COVID bronchitis illness.  He has had COVID twice per his report.  We discussed today that we have seen patients who had not been asthmatic in the past go on to have a reactive airway disease status post COVID.  -Continue to rest. -Continue mucinex (DM if cough) - Continue augmentin added Z-Pak -Continue prednisone taper -Tessalon Perles to help with cough - Continue albuterol 2 puffs scheduled every 4 hours for 2 days, then start to taper to every 6 hours, then every 8 hours and then as needed -Start Pulmicort inhaler 2 puffs twice daily and continue for at least 4 weeks. -Start nightly antihistamine either Zyrtec/Xyzal OTC - BMP, BNP and respiratory virus panel collected today -CT angio chest with and without stat> if positive for PE would recommend  patient be seen through the emergency room since he would possibly need a repeat INR and Lovenox bridge, since his warfarin was subtherapeutic 2 days ago. Follow-up will be discussed after all results received.  Patient will have FMLA papers forwarded to [place], he is expected to be out the rest of this week as of now.  Reviewed expectations re: course of current medical issues. Discussed self-management of symptoms. Outlined signs and symptoms indicating need for more acute intervention. Patient verbalized understanding and all questions were answered. Patient  received an After-Visit Summary.    Orders Placed This Encounter  Procedures   Respiratory virus panel   CT Angio Chest W/Cm &/Or Wo Cm   Basic Metabolic Panel (BMET)   B Nat Peptide   POC COVID-19 BinaxNow   Meds ordered this encounter  Medications   DISCONTD: fluticasone (FLOVENT HFA) 110 MCG/ACT inhaler    Sig: Inhale 2 puffs into the lungs in the morning and at bedtime.    Dispense:  1 each    Refill:  3   azithromycin (ZITHROMAX) 250 MG tablet    Sig: Take 2 tablets on day 1, then 1 tablet daily on days 2 through 5    Dispense:  6 tablet    Refill:  0   benzonatate (TESSALON) 200 MG capsule    Sig: Take 1 capsule (200 mg total) by mouth 3 (three) times daily as needed for cough.    Dispense:  30 capsule    Refill:  0   ipratropium-albuterol (DUONEB) 0.5-2.5 (3) MG/3ML nebulizer solution 3 mL   albuterol (PROVENTIL) (2.5 MG/3ML) 0.083% nebulizer solution 2.5 mg   Budesonide 90 MCG/ACT inhaler    Sig: Inhale 2 puffs into the lungs 2 (two) times daily.    Dispense:  1 each    Refill:  2   Referral Orders  No referral(s) requested today   66 minutes spent providing patient acute urgent care for mild respiratory distress with multiple breathing treatments, stat labs and advanced imaging studies ordered.   Note is dictated utilizing voice recognition software. Although note has been proof read prior to signing, occasional typographical errors still can be missed. If any questions arise, please do not hesitate to call for verification.   electronically signed by:  Felix Pacini, DO  [facility] [place]

## 2022-03-25 ENCOUNTER — Telehealth: Payer: Self-pay | Admitting: Family Medicine

## 2022-03-25 NOTE — Telephone Encounter (Signed)
Call patient: Randie Heinz news no pulmonary embolism.  The CT did confirm small infection in the left lower lobe . 1. Negative for acute pulmonary embolism. 2. Small airway infection/inflammation in the left lower lobe and lingula.  Devise plan to return to work on Monday, September 18.  Follow-up in 2 weeks

## 2022-03-25 NOTE — Telephone Encounter (Signed)
Spoke with pt regarding labs and instructions.   

## 2022-03-27 LAB — RESPIRATORY VIRUS PANEL

## 2022-03-31 ENCOUNTER — Telehealth: Payer: Self-pay | Admitting: Family Medicine

## 2022-03-31 NOTE — Telephone Encounter (Signed)
Please inform patient I have completed FMLA paperwork and placed back in [employer] work basket.

## 2022-04-01 ENCOUNTER — Ambulatory Visit (INDEPENDENT_AMBULATORY_CARE_PROVIDER_SITE_OTHER): Payer: BC Managed Care – PPO

## 2022-04-01 DIAGNOSIS — Z7901 Long term (current) use of anticoagulants: Secondary | ICD-10-CM

## 2022-04-01 LAB — POCT INR: INR: 2.4 (ref 2.0–3.0)

## 2022-04-01 MED ORDER — WARFARIN SODIUM 6 MG PO TABS: ORAL_TABLET | ORAL | 1 refills | Status: DC

## 2022-04-01 NOTE — Progress Notes (Addendum)
Continue  6mg  (1 tablet) daily except take 0 mg onvWednesdays. Re-check in 6 weeks.  Sent in refill of warfarin per pt request. Pt is establishing care with Dr. Raoul Pitch due to that clinic being closer to his home.

## 2022-04-01 NOTE — Patient Instructions (Addendum)
Pre visit review using our clinic review tool, if applicable. No additional management support is needed unless otherwise documented below in the visit note.  Continue  6mg  (1 tablet) daily except take 0 mg onvWednesdays. Re-check in 6 weeks.

## 2022-04-01 NOTE — Telephone Encounter (Signed)
Spoke with pt and informed him that the paperwork will be ready for pickup at the front in brown folder.

## 2022-04-08 ENCOUNTER — Ambulatory Visit (INDEPENDENT_AMBULATORY_CARE_PROVIDER_SITE_OTHER): Payer: BC Managed Care – PPO | Admitting: Family Medicine

## 2022-04-08 ENCOUNTER — Encounter: Payer: Self-pay | Admitting: Family Medicine

## 2022-04-08 VITALS — BP 120/73 | HR 77 | Temp 97.7°F | Ht 72.0 in | Wt 264.0 lb

## 2022-04-08 DIAGNOSIS — J189 Pneumonia, unspecified organism: Secondary | ICD-10-CM | POA: Diagnosis not present

## 2022-04-08 DIAGNOSIS — D6869 Other thrombophilia: Secondary | ICD-10-CM | POA: Insufficient documentation

## 2022-04-08 NOTE — Patient Instructions (Signed)
No follow-ups on file.        Great to see you today.  I have refilled the medication(s) we provide.   If labs were collected, we will inform you of lab results once received either by echart message or telephone call.   - echart message- for normal results that have been seen by the patient already.   - telephone call: abnormal results or if patient has not viewed results in their echart.  

## 2022-04-08 NOTE — Progress Notes (Signed)
[name] [name] , 07/13/1970, 51 y.o., male MRN: [id number] Patient Care Team    Relationship Specialty Notifications Start End  Natalia Leatherwood, DO PCP - General Family Medicine  04/08/22     Chief Complaint  Patient presents with   URI    F/u; improving     Subjective: [name] [name] is a 51 y.o. male patient presents for follow up resp. Illness.He is feeling much improved. He has been able to return to his normal routine. He is using the daily inhaler twice a day.   Prior note: Xray did not show signs of pneumonia. CBC was unremarkable.  D-Dimer was normal.  His INR was subtherapeutic at 1.4, goal of warfarin is 2-3 for his history of thrombosis.  Patient states he is not feeling any improvement and is feeling more short of breath than he had been seen 2 days ago.  He is taking the Augmentin and prednisone, as well as using the albuterol 3 times a day.  He denies any fevers or chills.  He is still tachycardic today.  He has been drinking plenty of water.  He now states he cannot get through a full sentence without feeling winded.  Reports yesterday he became even more weak feeling. Patient reports he has a pulse oximeter at home and he was in the 88-89% range earlier today.  Prior note: Pt presents for an OV with complaints of worsening cough of 2 weeks duration.  Associated symptoms include fatigue, dry cough and chills. He was seen in UCx2 and dx with pna LLLL. No xray or labs collected. Doxy and prednisone initially prescribed and pt had hives with doxy use. Doxy switched to Augmentin and pt has been tolerating. He was prescribed prednisone, but accidentally threw it away instead of the doxy.  Patient reports he has been extremely fatigued over the last 2 weeks. He denies history of asthma or COPD.  He has never smoked. He does have a history of blood clot in which he takes warfarin daily.  He reports his warfarin is typically always at goal.  Per EMR review there was 2 occasions  this year where it was just shy of goal.  He denies any missed doses of warfarin.  He does admit he has not been to the Coumadin clinic in a little over 2 weeks due to rescheduling of the appointments. He denies any lower extremity swelling, redness or pain.      03/24/2022   11:18 AM 11/04/2020    9:26 AM 08/09/2019    4:19 PM 07/24/2017    4:45 PM  Depression screen PHQ 2/9  Decreased Interest 0 0 0 0  Down, Depressed, Hopeless 0 0 0 0  PHQ - 2 Score 0 0 0 0  Altered sleeping 1 0 0   Tired, decreased energy 1 0 1   Change in appetite 0 0 1   Feeling bad or failure about yourself  0 0 0   Trouble concentrating 1 0 0   Moving slowly or fidgety/restless 0 0 0   Suicidal thoughts 0 0 0   PHQ-9 Score 3 0 2   Difficult doing work/chores  Not difficult at all Not difficult at all     Allergies  Allergen Reactions   Bee Venom Anaphylaxis   Doxycycline Hives   Social History   Social History Narrative   Not on file   Past Medical History:  Diagnosis Date   Heartburn  Hepatic steatosis    asymptomatic   Kidney stones    Low back pain on left side with sciatica 03/27/2013   Portal vein thrombosis    Ureterolithiasis 03/09/2012   Hx of on right 2009   Past Surgical History:  Procedure Laterality Date   ANTERIOR CRUCIATE LIGAMENT REPAIR  12/2005   left    LASIK     left eye   Family History  Problem Relation Age of Onset   Hyperlipidemia Mother    Gallbladder disease Father    Dementia Father    Deep vein thrombosis Maternal Aunt    Diabetes Maternal Grandmother    Stroke Maternal Grandmother    Colon cancer Neg Hx    Allergies as of 04/08/2022       Reactions   Bee Venom Anaphylaxis   Doxycycline Hives        Medication List        Accurate as of April 08, 2022  2:37 PM. If you have any questions, ask your nurse or doctor.          STOP taking these medications    amoxicillin-clavulanate 875-125 MG tablet Commonly known as: AUGMENTIN Stopped  by: Howard Pouch, DO   benzonatate 200 MG capsule Commonly known as: TESSALON Stopped by: Howard Pouch, DO   predniSONE 20 MG tablet Commonly known as: DELTASONE Stopped by: Howard Pouch, DO       TAKE these medications    albuterol 108 (90 Base) MCG/ACT inhaler Commonly known as: VENTOLIN HFA Inhale 2 puffs into the lungs every 4 (four) hours as needed.   Budesonide 90 MCG/ACT inhaler Inhale 2 puffs into the lungs 2 (two) times daily.   pravastatin 20 MG tablet Commonly known as: Pravachol Take 0.5 tablets (10 mg total) by mouth daily.   warfarin 6 MG tablet Commonly known as: COUMADIN Take as directed by the anticoagulation clinic. If you are unsure how to take this medication, talk to your nurse or doctor. Original instructions: TAKE 1 TABLET DAILY EXCEPT TAKE ZERO TABLETS ON WEDNESDAYS OR AS DIRECTED BY ANTICOAGULATION CLINIC        All past medical history, surgical history, allergies, family history, immunizations andmedications were updated in the EMR today and reviewed under the history and medication portions of their EMR.     ROS Negative, with the exception of above mentioned in HPI   Objective:  BP 120/73   Pulse 77   Temp 97.7 F (36.5 C) (Oral)   Ht 6' (1.829 m)   Wt 264 lb (119.7 kg)   SpO2 97%   BMI 35.80 kg/m  Body mass index is 35.8 kg/m. Physical Exam Vitals and nursing note reviewed.  Constitutional:      General: He is not in acute distress.    Appearance: Normal appearance. He is not ill-appearing, toxic-appearing or diaphoretic.  HENT:     Head: Normocephalic and atraumatic.  Eyes:     General: No scleral icterus.       Right eye: No discharge.        Left eye: No discharge.     Extraocular Movements: Extraocular movements intact.     Pupils: Pupils are equal, round, and reactive to light.  Cardiovascular:     Rate and Rhythm: Normal rate and regular rhythm.  Pulmonary:     Effort: Pulmonary effort is normal. No respiratory  distress.     Breath sounds: Normal breath sounds. No wheezing, rhonchi or rales.  Skin:  General: Skin is warm and dry.     Coloration: Skin is not jaundiced or pale.     Findings: No rash.  Neurological:     Mental Status: He is alert and oriented to person, place, and time. Mental status is at baseline.  Psychiatric:        Mood and Affect: Mood normal.        Behavior: Behavior normal.        Thought Content: Thought content normal.        Judgment: Judgment normal.     No results found. No results found. No results found for this or any previous visit (from the past 24 hour(s)).   Assessment/Plan: [name] [name] is a 51 y.o. male present for OV for  Pneumonia of left lower lobe due to infectious organism He is much improved on exam today.  VSS stable and oxygen sats are great. Continue albuterol 1-2 puffs PRN- we discusswd proper use and if needing more than 2 zx week to follow up  immediately.  Continue budesonide 2 puffs BID for a total of 4 weeks, then stop. If symptoms return, then restart  Reviewed expectations re: course of current medical issues. Discussed self-management of symptoms. Outlined signs and symptoms indicating need for more acute intervention. Patient verbalized understanding and all questions were answered. Patient received an After-Visit Summary.    No orders of the defined types were placed in this encounter.  No orders of the defined types were placed in this encounter.  Referral Orders  No referral(s) requested today    Note is dictated utilizing voice recognition software. Although note has been proof read prior to signing, occasional typographical errors still can be missed. If any questions arise, please do not hesitate to call for verification.   electronically signed by:  Felix Pacini, DO  [facility] [place]

## 2022-04-12 NOTE — Telephone Encounter (Signed)
Note Call patient: Great news no pulmonary embolism.  The CT did confirm small infection in the left lower lobe . 1. Negative for acute pulmonary embolism. 2. Small airway infection/inflammation in the left lower lobe and lingula.   Devise plan to return to work on Monday, September 18.   Follow-up in 2 weeks     Pt dropped off folder with FMLA forms with notes requesting date change. Folder given to PCP.

## 2022-04-14 NOTE — Telephone Encounter (Signed)
Papers completed

## 2022-04-14 NOTE — Telephone Encounter (Signed)
Spoke with patient regarding results/recommendations.  

## 2022-04-25 NOTE — Telephone Encounter (Signed)
Closing open encounters.  Patient is scheduled to see Dr. Raoul Pitch on 05/31/22.  Closed.

## 2022-05-13 ENCOUNTER — Ambulatory Visit: Payer: BC Managed Care – PPO

## 2022-05-16 ENCOUNTER — Ambulatory Visit (INDEPENDENT_AMBULATORY_CARE_PROVIDER_SITE_OTHER): Payer: BC Managed Care – PPO | Admitting: Family Medicine

## 2022-05-16 ENCOUNTER — Encounter: Payer: Self-pay | Admitting: Family Medicine

## 2022-05-16 VITALS — BP 128/89 | HR 124 | Temp 98.3°F | Wt 258.8 lb

## 2022-05-16 DIAGNOSIS — R051 Acute cough: Secondary | ICD-10-CM

## 2022-05-16 DIAGNOSIS — J988 Other specified respiratory disorders: Secondary | ICD-10-CM

## 2022-05-16 DIAGNOSIS — B9689 Other specified bacterial agents as the cause of diseases classified elsewhere: Secondary | ICD-10-CM

## 2022-05-16 DIAGNOSIS — J029 Acute pharyngitis, unspecified: Secondary | ICD-10-CM

## 2022-05-16 MED ORDER — CETIRIZINE HCL 10 MG PO TABS: 10.0000 mg | ORAL_TABLET | Freq: Every day | ORAL | 3 refills | Status: DC

## 2022-05-16 MED ORDER — AMOXICILLIN-POT CLAVULANATE 875-125 MG PO TABS: 1.0000 | ORAL_TABLET | Freq: Two times a day (BID) | ORAL | 0 refills | Status: DC

## 2022-05-16 NOTE — Progress Notes (Unsigned)
[name] [name] , [birth year], 51 y.o., male MRN: [id number] Patient Care Team    Relationship Specialty Notifications Start End  Ma Hillock, DO PCP - General Family Medicine  04/08/22     Chief Complaint  Patient presents with   Sore Throat     Subjective: Pt presents for an OV with complaints of sore throat  of 1 week duration.  He took Sudafed prior to appointment to help with symptoms.  He reports his young child was ill and his wife well for the last 1 to 2 weeks.  Both required antibiotics for treatment.  Patient recently had difficulty recovering from pneumonia.  She reports sore throat, sinus pressure, cough and headache.    03/24/2022   11:18 AM 11/04/2020    9:26 AM 08/09/2019    4:19 PM 07/24/2017    4:45 PM  Depression screen PHQ 2/9  Decreased Interest 0 0 0 0  Down, Depressed, Hopeless 0 0 0 0  PHQ - 2 Score 0 0 0 0  Altered sleeping 1 0 0   Tired, decreased energy 1 0 1   Change in appetite 0 0 1   Feeling bad or failure about yourself  0 0 0   Trouble concentrating 1 0 0   Moving slowly or fidgety/restless 0 0 0   Suicidal thoughts 0 0 0   PHQ-9 Score 3 0 2   Difficult doing work/chores  Not difficult at all Not difficult at all     Allergies  Allergen Reactions   Bee Venom Anaphylaxis   Doxycycline Hives   Social History   Social History Narrative   Not on file   Past Medical History:  Diagnosis Date   Allergy    Clotting disorder (HCC)    GERD (gastroesophageal reflux disease)    Heartburn    Hepatic steatosis    asymptomatic   Hyperlipidemia    Kidney stones    Low back pain on left side with sciatica 03/27/2013   Portal vein thrombosis    Ureterolithiasis 03/09/2012   Hx of on right 2009   Past Surgical History:  Procedure Laterality Date   ANTERIOR CRUCIATE LIGAMENT REPAIR  12/2005   left    LASIK     left eye   Family History  Problem Relation Age of Onset   Hyperlipidemia Mother    Gallbladder disease Father     Dementia Father    Deep vein thrombosis Maternal Aunt    Diabetes Maternal Grandmother    Stroke Maternal Grandmother    Colon cancer Neg Hx    Allergies as of 05/16/2022       Reactions   Bee Venom Anaphylaxis   Doxycycline Hives        Medication List        Accurate as of May 16, 2022 11:59 PM. If you have any questions, ask your nurse or doctor.          albuterol 108 (90 Base) MCG/ACT inhaler Commonly known as: VENTOLIN HFA Inhale 2 puffs into the lungs every 4 (four) hours as needed.   amoxicillin-clavulanate 875-125 MG tablet Commonly known as: AUGMENTIN Take 1 tablet by mouth 2 (two) times daily. Started by: Howard Pouch, DO   Budesonide 90 MCG/ACT inhaler Inhale 2 puffs into the lungs 2 (two) times daily.   cetirizine 10 MG tablet Commonly known as: ZYRTEC Take 1 tablet (10 mg total) by mouth daily. Started by: Howard Pouch, DO  warfarin 6 MG tablet Commonly known as: COUMADIN Take as directed by the anticoagulation clinic. If you are unsure how to take this medication, talk to your nurse or doctor. Original instructions: TAKE 1 TABLET DAILY EXCEPT TAKE ZERO TABLETS ON WEDNESDAYS OR AS DIRECTED BY ANTICOAGULATION CLINIC        All past medical history, surgical history, allergies, family history, immunizations andmedications were updated in the EMR today and reviewed under the history and medication portions of their EMR.     ROS Negative, with the exception of above mentioned in HPI   Objective:  BP 128/89   Pulse (!) 124   Temp 98.3 F (36.8 C)   Wt 258 lb 12.8 oz (117.4 kg)   SpO2 96%   BMI 35.10 kg/m  Body mass index is 35.1 kg/m. Physical Exam Vitals and nursing note reviewed.  Constitutional:      General: He is not in acute distress.    Appearance: Normal appearance. He is not ill-appearing, toxic-appearing or diaphoretic.  HENT:     Head: Normocephalic and atraumatic.     Right Ear: Tympanic membrane and ear canal normal.      Left Ear: Tympanic membrane and ear canal normal.     Nose: Congestion and rhinorrhea present.     Mouth/Throat:     Mouth: Mucous membranes are moist.     Pharynx: Posterior oropharyngeal erythema present. No pharyngeal swelling.     Tonsils: No tonsillar exudate or tonsillar abscesses.  Eyes:     General: No scleral icterus.       Right eye: No discharge.        Left eye: No discharge.     Extraocular Movements: Extraocular movements intact.     Pupils: Pupils are equal, round, and reactive to light.  Cardiovascular:     Rate and Rhythm: Regular rhythm. Tachycardia present.  Pulmonary:     Effort: Pulmonary effort is normal. No respiratory distress.     Breath sounds: Rhonchi present. No wheezing or rales.  Musculoskeletal:     Cervical back: Neck supple.  Lymphadenopathy:     Cervical: No cervical adenopathy.  Skin:    General: Skin is warm and dry.     Coloration: Skin is not jaundiced or pale.     Findings: No rash.  Neurological:     Mental Status: He is alert and oriented to person, place, and time. Mental status is at baseline.  Psychiatric:        Mood and Affect: Mood normal.        Behavior: Behavior normal.        Thought Content: Thought content normal.        Judgment: Judgment normal.     No results found. No results found. Results for orders placed or performed in visit on 05/16/22 (from the past 24 hour(s))  POCT Influenza A/B     Status: None   Collection Time: 05/17/22 12:55 PM  Result Value Ref Range   Influenza A, POC Negative Negative   Influenza B, POC Negative Negative  POC COVID-19 BinaxNow     Status: None   Collection Time: 05/17/22 12:55 PM  Result Value Ref Range   SARS Coronavirus 2 Ag Negative Negative  POCT rapid strep A     Status: None   Collection Time: 05/17/22 12:56 PM  Result Value Ref Range   Rapid Strep A Screen Negative Negative    Assessment/Plan: [name] [name] is a 51 y.o. male present  for OV for  Sore  throat/cough/bacterial resp inf Poct  are negative today. - POCT Influenza A/B - POCT rapid strep A - Upper Respiratory Culture - POC COVID-19 BinaxNow Rest, hydrate.  + flonase, mucinex (DM if cough), nettie pot or nasal saline.  Avoid Sudafed products, patient is rather tachycardic today. Start Zyrtec nightly Augmentin twice daily prescribed, take until completed.  If cough present it can last up to 6-8 weeks.  F/U 2 weeks of not improved.      Reviewed expectations re: course of current medical issues. Discussed self-management of symptoms. Outlined signs and symptoms indicating need for more acute intervention. Patient verbalized understanding and all questions were answered. Patient received an After-Visit Summary.    Orders Placed This Encounter  Procedures   Upper Respiratory Culture   POCT Influenza A/B   POCT rapid strep A   POC COVID-19 BinaxNow   Meds ordered this encounter  Medications   amoxicillin-clavulanate (AUGMENTIN) 875-125 MG tablet    Sig: Take 1 tablet by mouth 2 (two) times daily.    Dispense:  20 tablet    Refill:  0   cetirizine (ZYRTEC) 10 MG tablet    Sig: Take 1 tablet (10 mg total) by mouth daily.    Dispense:  90 tablet    Refill:  3   Referral Orders  No referral(s) requested today     Note is dictated utilizing voice recognition software. Although note has been proof read prior to signing, occasional typographical errors still can be missed. If any questions arise, please do not hesitate to call for verification.   electronically signed by:  Felix Pacini, DO  [facility] [place]

## 2022-05-16 NOTE — Patient Instructions (Signed)
No follow-ups on file.        Great to see you today.  I have refilled the medication(s) we provide.   If labs were collected, we will inform you of lab results once received either by echart message or telephone call.   - echart message- for normal results that have been seen by the patient already.   - telephone call: abnormal results or if patient has not viewed results in their echart.  

## 2022-05-17 LAB — POCT RAPID STREP A (OFFICE): Rapid Strep A Screen: NEGATIVE

## 2022-05-17 LAB — POCT INFLUENZA A/B
Influenza A, POC: NEGATIVE
Influenza B, POC: NEGATIVE

## 2022-05-17 LAB — POC COVID19 BINAXNOW: SARS Coronavirus 2 Ag: NEGATIVE

## 2022-05-19 LAB — CULTURE, UPPER RESPIRATORY
MICRO NUMBER:: 14148993
SPECIMEN QUALITY:: ADEQUATE

## 2022-05-20 ENCOUNTER — Ambulatory Visit (INDEPENDENT_AMBULATORY_CARE_PROVIDER_SITE_OTHER): Payer: BC Managed Care – PPO

## 2022-05-20 DIAGNOSIS — Z7901 Long term (current) use of anticoagulants: Secondary | ICD-10-CM | POA: Diagnosis not present

## 2022-05-20 LAB — POCT INR: INR: 1.5 — AB (ref 2.0–3.0)

## 2022-05-20 NOTE — Progress Notes (Signed)
Pt started Augmentin 875-125 mg BID x 10 days for a respiratory infection on 11/6.    Increase dose today to 1 1/2 tablets (9 mg total) and increase dose tomorrow to 1/12 tablets then continue 6mg  (1 tablet) daily except take 0 mg on Wednesdays. Recheck on 11/29 at [facility] at 8:30 am per pt request.

## 2022-05-20 NOTE — Patient Instructions (Addendum)
Increase dose today to 1 1/2 tablets (9 mg total) and increase dose tomorrow to 1 1/2 tablets (9 mg total) then continue 6mg  (1 tablet) daily except take 0 mg on Wednesdays. Recheck on 11/29 at [facility] at 8:30 am

## 2022-05-31 ENCOUNTER — Ambulatory Visit (INDEPENDENT_AMBULATORY_CARE_PROVIDER_SITE_OTHER): Payer: BC Managed Care – PPO | Admitting: Family Medicine

## 2022-05-31 ENCOUNTER — Encounter: Payer: Self-pay | Admitting: Family Medicine

## 2022-05-31 VITALS — BP 109/81 | HR 107 | Temp 97.8°F | Ht 73.0 in | Wt 264.6 lb

## 2022-05-31 DIAGNOSIS — Z7901 Long term (current) use of anticoagulants: Secondary | ICD-10-CM

## 2022-05-31 DIAGNOSIS — Z79899 Other long term (current) drug therapy: Secondary | ICD-10-CM

## 2022-05-31 DIAGNOSIS — Z0000 Encounter for general adult medical examination without abnormal findings: Secondary | ICD-10-CM

## 2022-05-31 DIAGNOSIS — I81 Portal vein thrombosis: Secondary | ICD-10-CM

## 2022-05-31 DIAGNOSIS — Z2821 Immunization not carried out because of patient refusal: Secondary | ICD-10-CM | POA: Diagnosis not present

## 2022-05-31 DIAGNOSIS — D6869 Other thrombophilia: Secondary | ICD-10-CM

## 2022-05-31 DIAGNOSIS — E782 Mixed hyperlipidemia: Secondary | ICD-10-CM

## 2022-05-31 DIAGNOSIS — Z1211 Encounter for screening for malignant neoplasm of colon: Secondary | ICD-10-CM

## 2022-05-31 DIAGNOSIS — Z23 Encounter for immunization: Secondary | ICD-10-CM | POA: Diagnosis not present

## 2022-05-31 LAB — LIPID PANEL
Cholesterol: 267 mg/dL — ABNORMAL HIGH (ref 0–200)
HDL: 48.1 mg/dL (ref >39.00)
NonHDL: 218.96
Total CHOL/HDL Ratio: 6
Triglycerides: 290 mg/dL — ABNORMAL HIGH (ref 0.0–149.0)
VLDL: 58 mg/dL — ABNORMAL HIGH (ref 0.0–40.0)

## 2022-05-31 LAB — HEMOGLOBIN A1C: Hgb A1c MFr Bld: 6.3 % (ref 4.6–6.5)

## 2022-05-31 LAB — TSH: TSH: 1.46 u[IU]/mL (ref 0.35–5.50)

## 2022-05-31 LAB — LDL CHOLESTEROL, DIRECT: Direct LDL: 180 mg/dL

## 2022-05-31 MED ORDER — WARFARIN SODIUM 6 MG PO TABS: ORAL_TABLET | ORAL | 3 refills | Status: DC

## 2022-05-31 NOTE — Progress Notes (Signed)
Patient ID: [name] [name], male  DOB: 06-24-1971, 51 y.o.   MRN: [id number] Patient Care Team    Relationship Specialty Notifications Start End  Ma Hillock, DO PCP - General Family Medicine  04/08/22     Chief Complaint  Patient presents with   Annual Exam    Pt is not fasting    Subjective:  [name] [name] is a 51 y.o. male present for CPE. All past medical history, surgical history, allergies, family history, immunizations, medications and social history were updated in the electronic medical record today. All recent labs, ED visits and hospitalizations within the last year were reviewed.  Health maintenance:  Colonoscopy: No family history.  Routine screening due- cologuard ordered Immunizations:  tdap due (last 2011) completed today, influenza declined Infectious disease screening: HIV declined, Hep C completed PSA: No results found for: "PSA", pt was counseled on prostate cancer screenings.  Baseline PSA at 51 years old. Assistive device: None Oxygen use: None Patient has a Dental home. Hospitalizations/ED visits: Reviewed     05/31/2022    8:44 AM 03/24/2022   11:18 AM 11/04/2020    9:26 AM 08/09/2019    4:19 PM 07/24/2017    4:45 PM  Depression screen PHQ 2/9  Decreased Interest 0 0 0 0 0  Down, Depressed, Hopeless 0 0 0 0 0  PHQ - 2 Score 0 0 0 0 0  Altered sleeping  1 0 0   Tired, decreased energy  1 0 1   Change in appetite  0 0 1   Feeling bad or failure about yourself   0 0 0   Trouble concentrating  1 0 0   Moving slowly or fidgety/restless  0 0 0   Suicidal thoughts  0 0 0   PHQ-9 Score  3 0 2   Difficult doing work/chores   Not difficult at all Not difficult at all       03/24/2022   11:18 AM  GAD 7 : Generalized Anxiety Score  Nervous, Anxious, on Edge 1  Control/stop worrying 0  Worry too much - different things 0  Trouble relaxing 0  Restless 0  Easily annoyed or irritable 1  Afraid - awful might happen 1  Total GAD 7 Score 3            No data to display            Immunization History  Administered Date(s) Administered   Influenza Split 04/23/2012   Influenza,inj,Quad PF,6+ Mos 03/27/2013     Past Medical History:  Diagnosis Date   Allergy    Clotting disorder (HCC)    GERD (gastroesophageal reflux disease)    Heartburn    Hepatic steatosis    asymptomatic   Hyperlipidemia    Kidney stones    Low back pain on left side with sciatica 03/27/2013   Portal vein thrombosis    Ureterolithiasis 03/09/2012   Hx of on right 2009   Allergies  Allergen Reactions   Bee Venom Anaphylaxis   Doxycycline Hives   Past Surgical History:  Procedure Laterality Date   ANTERIOR CRUCIATE LIGAMENT REPAIR  12/2005   left    LASIK     left eye   Family History  Problem Relation Age of Onset   Hyperlipidemia Mother    Gallbladder disease Father    Dementia Father    Diabetes Maternal Grandmother    Stroke Maternal Grandmother    Deep vein thrombosis  Maternal Aunt    Colon cancer Neg Hx    Social History   Social History Narrative   Not on file    Allergies as of 05/31/2022       Reactions   Bee Venom Anaphylaxis   Doxycycline Hives        Medication List        Accurate as of May 31, 2022  9:07 AM. If you have any questions, ask your nurse or doctor.          STOP taking these medications    amoxicillin-clavulanate 875-125 MG tablet Commonly known as: AUGMENTIN Stopped by: Howard Pouch, DO   Budesonide 90 MCG/ACT inhaler Stopped by: Howard Pouch, DO       TAKE these medications    albuterol 108 (90 Base) MCG/ACT inhaler Commonly known as: VENTOLIN HFA Inhale 2 puffs into the lungs every 4 (four) hours as needed.   cetirizine 10 MG tablet Commonly known as: ZYRTEC Take 1 tablet (10 mg total) by mouth daily.   warfarin 6 MG tablet Commonly known as: COUMADIN Take as directed by the anticoagulation clinic. If you are unsure how to take this medication, talk to your  nurse or doctor. Original instructions: TAKE 1 TABLET DAILY EXCEPT TAKE ZERO TABLETS ON WEDNESDAYS OR AS DIRECTED BY ANTICOAGULATION CLINIC       All past medical history, surgical history, allergies, family history, immunizations andmedications were updated in the EMR today and reviewed under the history and medication portions of their EMR.       CT Angio Chest W/Cm &/Or Wo Cm Result Date: 03/24/2022 IMPRESSION: 1. Negative for acute pulmonary embolism. 2. Small airway infection/inflammation in the left lower lobe and lingula.   ROS 14 pt review of systems performed and negative (unless mentioned in an HPI)  Objective: BP 109/81   Pulse (!) 107   Temp 97.8 F (36.6 C)   Ht 6\' 1"  (1.854 m)   Wt 264 lb 9.6 oz (120 kg)   SpO2 96%   BMI 34.91 kg/m  Physical Exam Constitutional:      General: He is not in acute distress.    Appearance: Normal appearance. He is not ill-appearing, toxic-appearing or diaphoretic.  HENT:     Head: Normocephalic and atraumatic.     Right Ear: Tympanic membrane, ear canal and external ear normal. There is no impacted cerumen.     Left Ear: Tympanic membrane, ear canal and external ear normal. There is no impacted cerumen.     Nose: Nose normal. No congestion or rhinorrhea.     Mouth/Throat:     Mouth: Mucous membranes are moist.     Pharynx: Oropharynx is clear. No oropharyngeal exudate or posterior oropharyngeal erythema.  Eyes:     General: No scleral icterus.       Right eye: No discharge.        Left eye: No discharge.     Extraocular Movements: Extraocular movements intact.     Pupils: Pupils are equal, round, and reactive to light.  Cardiovascular:     Rate and Rhythm: Normal rate and regular rhythm.     Pulses: Normal pulses.     Heart sounds: Normal heart sounds. No murmur heard.    No friction rub. No gallop.  Pulmonary:     Effort: Pulmonary effort is normal. No respiratory distress.     Breath sounds: Normal breath sounds. No  stridor. No wheezing, rhonchi or rales.  Chest:  Chest wall: No tenderness.  Abdominal:     General: Abdomen is flat. Bowel sounds are normal. There is no distension.     Palpations: Abdomen is soft. There is no mass.     Tenderness: There is no abdominal tenderness. There is no right CVA tenderness, left CVA tenderness, guarding or rebound.     Hernia: No hernia is present.  Musculoskeletal:        General: No swelling or tenderness. Normal range of motion.     Cervical back: Normal range of motion and neck supple.     Right lower leg: No edema.     Left lower leg: No edema.  Lymphadenopathy:     Cervical: No cervical adenopathy.  Skin:    General: Skin is warm and dry.     Coloration: Skin is not jaundiced.     Findings: No bruising, lesion or rash.  Neurological:     General: No focal deficit present.     Mental Status: He is alert and oriented to person, place, and time. Mental status is at baseline.     Cranial Nerves: No cranial nerve deficit.     Sensory: No sensory deficit.     Motor: No weakness.     Coordination: Coordination normal.     Gait: Gait normal.     Deep Tendon Reflexes: Reflexes normal.  Psychiatric:        Mood and Affect: Mood normal.        Behavior: Behavior normal.        Thought Content: Thought content normal.        Judgment: Judgment normal.    No results found.  Assessment/plan: [name] [name] is a 51 y.o. male present for CPE Mixed hyperlipidemia Discussed statin if cholesterol still high - he is agreeable, as long as not crestor (caused myalgia) - TSH - Lipid panel - diet and exercise Discussed cardiac CT screen if cholesterol still high. He is agreeable. Influenza vaccination declined by patient Colon cancer screening - Cologuard Encounter for long-term current use of medication - Hemoglobin A1c Portal vein thrombosis/Acquired thrombophilia (HCC)-warafin use - warfarin (COUMADIN) 6 MG tablet; TAKE 1 TABLET DAILY EXCEPT TAKE ZERO  TABLETS ON WEDNESDAYS OR AS DIRECTED BY ANTICOAGULATION CLINIC  Dispense: 100 tablet; Refill: 3  Need for diphtheria-tetanus-pertussis (Tdap) vaccine Administered today Routine general medical examination at a health care facility Colonoscopy: No family history.  Routine screening due- cologuard ordered Immunizations:  tdap due (last 2011) completed today, influenza declined Infectious disease screening: HIV declined, Hep C completed PSA: No results found for: "PSA", pt was counseled on prostate cancer screenings.  Baseline PSA at 51 years old. Patient was encouraged to exercise greater than 150 minutes a week. Patient was encouraged to choose a diet filled with fresh fruits and vegetables, and lean meats. AVS provided to patient today for education/recommendation on gender specific health and safety maintenance.   No follow-ups on file.  Orders Placed This Encounter  Procedures   Cologuard   TSH   Lipid panel   Hemoglobin A1c   Orders Placed This Encounter  Procedures   Cologuard   TSH   Lipid panel   Hemoglobin A1c   Meds ordered this encounter  Medications   warfarin (COUMADIN) 6 MG tablet    Sig: TAKE 1 TABLET DAILY EXCEPT TAKE ZERO TABLETS ON WEDNESDAYS OR AS DIRECTED BY ANTICOAGULATION CLINIC    Dispense:  100 tablet    Refill:  3   Referral Orders  No referral(s) requested today     Note is dictated utilizing voice recognition software. Although note has been proof read prior to signing, occasional typographical errors still can be missed. If any questions arise, please do not hesitate to call for verification.  Electronically signed by: Howard Pouch, DO [place]

## 2022-05-31 NOTE — Patient Instructions (Addendum)
Return in about 1 year (around 06/01/2023) for cpe (20 min).        Great to see you today.  I have refilled the medication(s) we provide.   If labs were collected, we will inform you of lab results once received either by echart message or telephone call.   - echart message- for normal results that have been seen by the patient already.   - telephone call: abnormal results or if patient has not viewed results in their echart.  Health Maintenance, Male Adopting a healthy lifestyle and getting preventive care are important in promoting health and wellness. Ask your health care provider about: The right schedule for you to have regular tests and exams. Things you can do on your own to prevent diseases and keep yourself healthy. What should I know about diet, weight, and exercise? Eat a healthy diet  Eat a diet that includes plenty of vegetables, fruits, low-fat dairy products, and lean protein. Do not eat a lot of foods that are high in solid fats, added sugars, or sodium. Maintain a healthy weight Body mass index (BMI) is a measurement that can be used to identify possible weight problems. It estimates body fat based on height and weight. Your health care provider can help determine your BMI and help you achieve or maintain a healthy weight. Get regular exercise Get regular exercise. This is one of the most important things you can do for your health. Most adults should: Exercise for at least 150 minutes each week. The exercise should increase your heart rate and make you sweat (moderate-intensity exercise). Do strengthening exercises at least twice a week. This is in addition to the moderate-intensity exercise. Spend less time sitting. Even light physical activity can be beneficial. Watch cholesterol and blood lipids Have your blood tested for lipids and cholesterol at 49 years of age, then have this test every 5 years. You may need to have your cholesterol levels checked more often  if: Your lipid or cholesterol levels are high. You are older than 49 years of age. You are at high risk for heart disease. What should I know about cancer screening? Many types of cancers can be detected early and may often be prevented. Depending on your health history and family history, you may need to have cancer screening at various ages. This may include screening for: Colorectal cancer. Prostate cancer. Skin cancer. Lung cancer. What should I know about heart disease, diabetes, and high blood pressure? Blood pressure and heart disease High blood pressure causes heart disease and increases the risk of stroke. This is more likely to develop in people who have high blood pressure readings or are overweight. Talk with your health care provider about your target blood pressure readings. Have your blood pressure checked: Every 3-5 years if you are 73-15 years of age. Every year if you are 96 years old or older. If you are between the ages of 34 and 72 and are a current or former smoker, ask your health care provider if you should have a one-time screening for abdominal aortic aneurysm (AAA). Diabetes Have regular diabetes screenings. This checks your fasting blood sugar level. Have the screening done: Once every three years after age 94 if you are at a normal weight and have a low risk for diabetes. More often and at a younger age if you are overweight or have a high risk for diabetes. What should I know about preventing infection? Hepatitis B If you have a higher risk for  hepatitis B, you should be screened for this virus. Talk with your health care provider to find out if you are at risk for hepatitis B infection. Hepatitis C Blood testing is recommended for: Everyone born from [birth year] through 1963. Anyone with known risk factors for hepatitis C. Sexually transmitted infections (STIs) You should be screened each year for STIs, including gonorrhea and chlamydia, if: You are sexually  active and are younger than 49 years of age. You are older than 49 years of age and your health care provider tells you that you are at risk for this type of infection. Your sexual activity has changed since you were last screened, and you are at increased risk for chlamydia or gonorrhea. Ask your health care provider if you are at risk. Ask your health care provider about whether you are at high risk for HIV. Your health care provider may recommend a prescription medicine to help prevent HIV infection. If you choose to take medicine to prevent HIV, you should first get tested for HIV. You should then be tested every 3 months for as long as you are taking the medicine. Follow these instructions at home: Alcohol use Do not drink alcohol if your health care provider tells you not to drink. If you drink alcohol: Limit how much you have to 0-2 drinks a day. Know how much alcohol is in your drink. In the [place] one drink equals one 12 oz bottle of beer (355 mL), one 5 oz glass of wine (148 mL), or one 1 oz glass of hard liquor (44 mL). Lifestyle Do not use any products that contain nicotine or tobacco. These products include cigarettes, chewing tobacco, and vaping devices, such as e-cigarettes. If you need help quitting, ask your health care provider. Do not use street drugs. Do not share needles. Ask your health care provider for help if you need support or information about quitting drugs. General instructions Schedule regular health, dental, and eye exams. Stay current with your vaccines. Tell your health care provider if: You often feel depressed. You have ever been abused or do not feel safe at home. Summary Adopting a healthy lifestyle and getting preventive care are important in promoting health and wellness. Follow your health care provider's instructions about healthy diet, exercising, and getting tested or screened for diseases. Follow your health care provider's instructions on  monitoring your cholesterol and blood pressure. This information is not intended to replace advice given to you by your health care provider. Make sure you discuss any questions you have with your health care provider. Document Revised: 11/16/2020 Document Reviewed: 11/16/2020 Elsevier Patient Education  [place].

## 2022-05-31 NOTE — Addendum Note (Signed)
Addended by: Filomena Jungling on: 05/31/2022 09:15 AM   Modules accepted: Orders

## 2022-06-01 ENCOUNTER — Telehealth: Payer: Self-pay | Admitting: Family Medicine

## 2022-06-01 MED ORDER — ATORVASTATIN CALCIUM 20 MG PO TABS: 20.0000 mg | ORAL_TABLET | Freq: Every day | ORAL | 3 refills | Status: DC

## 2022-06-01 NOTE — Telephone Encounter (Signed)
Spoke with pt regarding labs and instructions.   

## 2022-06-01 NOTE — Telephone Encounter (Signed)
Please call patient thyroid function is normal Blood cell counts and electrolytes are normal Diabetes screening/A1c is elevated in the prediabetic range at 6.3.  Medications are needed for this at this time, however if it reaches 6.5 or higher that would place him in the diabetic range and we would need to consider medication start.  Would encourage him to obtain routine exercise and follow a low sugar diet.  His cholesterol is elevated.  His HDL/good cholesterol is 48.  His LDL/bad cholesterol is 180.  Goal LDL for him would be at least less than 135, and with his prediabetes between 70-100 would be best.  I have called in a cholesterol medicine called atorvastatin.  This is different than the ones he tried in the past.  Would encourage him to take this nightly-sometime after dinner/before bed.  Follow-up in 3 months for cholesterol and prediabetes, follow-up with provider appointment and fasting labs.

## 2022-06-08 ENCOUNTER — Ambulatory Visit (INDEPENDENT_AMBULATORY_CARE_PROVIDER_SITE_OTHER): Payer: BC Managed Care – PPO

## 2022-06-08 DIAGNOSIS — Z7901 Long term (current) use of anticoagulants: Secondary | ICD-10-CM

## 2022-06-08 LAB — POCT INR: INR: 2.5 (ref 2.0–3.0)

## 2022-06-08 NOTE — Progress Notes (Signed)
Continue 6mg (1 tablet) daily except take 0 mg on Wednesdays. Recheck in 6 weeks. 

## 2022-06-08 NOTE — Patient Instructions (Signed)
Continue 6mg  (1 tablet) daily except take 0 mg on Wednesdays. Recheck on 1/10 at 8:30 at 3/10.

## 2022-06-24 LAB — COLOGUARD: COLOGUARD: NEGATIVE

## 2022-06-26 ENCOUNTER — Ambulatory Visit
Admission: EM | Admit: 2022-06-26 | Discharge: 2022-06-26 | Disposition: A | Discharge status: Home / Self Care | Payer: BC Managed Care – PPO | Attending: Family Medicine | Admitting: Family Medicine

## 2022-06-26 DIAGNOSIS — R109 Unspecified abdominal pain: Secondary | ICD-10-CM | POA: Insufficient documentation

## 2022-06-26 DIAGNOSIS — Z792 Long term (current) use of antibiotics: Secondary | ICD-10-CM | POA: Insufficient documentation

## 2022-06-26 DIAGNOSIS — N3001 Acute cystitis with hematuria: Secondary | ICD-10-CM | POA: Diagnosis present

## 2022-06-26 LAB — POCT URINALYSIS DIP (MANUAL ENTRY)
Bilirubin, UA: NEGATIVE
Glucose, UA: NEGATIVE mg/dL
Ketones, POC UA: NEGATIVE mg/dL
Nitrite, UA: POSITIVE — AB
Protein Ur, POC: >=300 mg/dL — AB
Spec Grav, UA: 1.02 (ref 1.010–1.025)
Urobilinogen, UA: 0.2 E.U./dL
pH, UA: 6 (ref 5.0–8.0)

## 2022-06-26 MED ORDER — CEPHALEXIN 500 MG PO CAPS: 1000.0000 mg | ORAL_CAPSULE | Freq: Two times a day (BID) | ORAL | 0 refills | Status: AC

## 2022-06-26 NOTE — ED Provider Notes (Signed)
[name]    CSN: [id number] Arrival date & time: 06/26/22  1303      History   Chief Complaint Chief Complaint  Patient presents with   Dysuria   Hematuria   Flank Pain    HPI [name] [name] is a 51 y.o. male.   HPI 51 year old male presents with fatigue decreased urinary output for 4 days dysuria and bilateral flank pain since yesterday.  Patient reports noticed blood clot in urine this morning.  Patient is currently on Coumadin for clotting disorder.  PMH significant for obesity, kidney stones, and HLD.  Past Medical History:  Diagnosis Date   Allergy    Clotting disorder (HCC)    GERD (gastroesophageal reflux disease)    Heartburn    Hepatic steatosis    asymptomatic   Hyperlipidemia    Kidney stones    Low back pain on left side with sciatica 03/27/2013   Portal vein thrombosis    Ureterolithiasis 03/09/2012   Hx of on right 2009    Patient Active Problem List   Diagnosis Date Noted   Acquired thrombophilia (HCC)-warafin use 04/08/2022   Obesity (BMI 30-39.9) 07/24/2017   Encounter for therapeutic drug monitoring 11/30/2016   Hyperlipidemia 04/23/2012   Fatty liver 03/18/2012   Portal vein thrombosis 03/13/2012    Past Surgical History:  Procedure Laterality Date   ANTERIOR CRUCIATE LIGAMENT REPAIR  12/2005   left    LASIK     left eye       Home Medications    Prior to Admission medications   Medication Sig Start Date End Date Taking? Authorizing Provider  cephALEXin (KEFLEX) 500 MG capsule Take 2 capsules (1,000 mg total) by mouth 2 (two) times daily for 7 days. 06/26/22 07/03/22 Yes Trevor Iha, FNP  albuterol (VENTOLIN HFA) 108 (90 Base) MCG/ACT inhaler Inhale 2 puffs into the lungs every 4 (four) hours as needed. 03/18/22   [provider]  atorvastatin (LIPITOR) 20 MG tablet Take 1 tablet (20 mg total) by mouth at bedtime. 06/01/22   Kuneff, Renee A, DO  cetirizine (ZYRTEC) 10 MG tablet Take 1 tablet (10 mg total)  by mouth daily. 05/16/22   Kuneff, Renee A, DO  warfarin (COUMADIN) 6 MG tablet TAKE 1 TABLET DAILY EXCEPT TAKE ZERO TABLETS ON WEDNESDAYS OR AS DIRECTED BY ANTICOAGULATION CLINIC 05/31/22   Natalia Leatherwood, DO    Family History Family History  Problem Relation Age of Onset   Hyperlipidemia Mother    Gallbladder disease Father    Dementia Father    Diabetes Maternal Grandmother    Stroke Maternal Grandmother    Deep vein thrombosis Maternal Aunt    Colon cancer Neg Hx     Social History Social History   Tobacco Use   Smoking status: Never    Passive exposure: Never   Smokeless tobacco: Never  Vaping Use   Vaping Use: Never used  Substance Use Topics   Alcohol use: Not Currently    Comment: occasionally   Drug use: No     Allergies   Bee venom and Doxycycline   Review of Systems Review of Systems  Genitourinary:  Positive for dysuria, flank pain and hematuria.  All other systems reviewed and are negative.    Physical Exam Triage Vital Signs ED Triage Vitals  Enc Vitals Group     BP 06/26/22 1446 139/79     Pulse Rate 06/26/22 1446 (!) 119     Resp 06/26/22 1446 18  Temp 06/26/22 1446 99 F (37.2 C)     Temp Source 06/26/22 1446 Oral     SpO2 06/26/22 1446 96 %     Weight 06/26/22 1443 260 lb (117.9 kg)     Height 06/26/22 1443 6\' 1"  (1.854 m)     Head Circumference --      Peak Flow --      Pain Score 06/26/22 1443 3     Pain Loc --      Pain Edu? --      Excl. in GC? --    No data found.  Updated Vital Signs BP 139/79 (BP Location: Left Arm)   Pulse (!) 119   Temp 99 F (37.2 C) (Oral)   Resp 18   Ht 6\' 1"  (1.854 m)   Wt 260 lb (117.9 kg)   SpO2 96%   BMI 34.30 kg/m   Physical Exam Vitals and nursing note reviewed.  Constitutional:      Appearance: Normal appearance. He is normal weight.  HENT:     Head: Normocephalic and atraumatic.     Mouth/Throat:     Mouth: Mucous membranes are moist.     Pharynx: Oropharynx is clear.  Eyes:      Extraocular Movements: Extraocular movements intact.     Conjunctiva/sclera: Conjunctivae normal.     Pupils: Pupils are equal, round, and reactive to light.  Cardiovascular:     Rate and Rhythm: Normal rate and regular rhythm.     Pulses: Normal pulses.     Heart sounds: Normal heart sounds. No murmur heard. Pulmonary:     Effort: Pulmonary effort is normal.     Breath sounds: Normal breath sounds. No wheezing, rhonchi or rales.  Abdominal:     Tenderness: There is right CVA tenderness. There is no left CVA tenderness.  Musculoskeletal:        General: Normal range of motion.     Cervical back: Normal range of motion and neck supple.  Skin:    General: Skin is warm and dry.  Neurological:     General: No focal deficit present.     Mental Status: He is alert and oriented to person, place, and time.      UC Treatments / Results  Labs (all labs ordered are listed, but only abnormal results are displayed) Labs Reviewed  POCT URINALYSIS DIP (MANUAL ENTRY) - Abnormal; Notable for the following components:      Result Value   Color, UA brown (*)    Clarity, UA cloudy (*)    Blood, UA large (*)    Protein Ur, POC >=300 (*)    Nitrite, UA Positive (*)    Leukocytes, UA Small (1+) (*)    All other components within normal limits  URINE CULTURE    EKG   Radiology No results found.  Procedures Procedures (including critical care time)  Medications Ordered in UC Medications - No data to display  Initial Impression / Assessment and Plan / UC Course  I have reviewed the triage vital signs and the nursing notes.  Pertinent labs & imaging results that were available during my care of the patient were reviewed by me and considered in my medical decision making (see chart for details).     MDM: 1.  Acute cystitis with hematuria-Rx'd Keflex, urine culture ordered. Advised patient to take medication as directed with food to completion.  Encouraged increase daily water  intake to 64 ounces per day. Advised patient we will  follow-up with urine culture results once returned.  Advised if symptoms worsen and or unresolved please follow-up with PCP or here for further evaluation.  Discharged home, hemodynamically stable.  Work note provided for 2 days per patient request prior to discharge. Final Clinical Impressions(s) / UC Diagnoses   Final diagnoses:  Flank pain  Acute cystitis with hematuria     Discharge Instructions      Advised patient to take medication as directed with food to completion.  Encouraged increase daily water intake to 64 ounces per day. Advised patient we will follow-up with urine culture results once returned.  Advised if symptoms worsen and or unresolved please follow-up with PCP or here for further evaluation.     ED Prescriptions     Medication Sig Dispense Auth. Provider   cephALEXin (KEFLEX) 500 MG capsule Take 2 capsules (1,000 mg total) by mouth 2 (two) times daily for 7 days. 28 capsule Trevor Iha, FNP      PDMP not reviewed this encounter.   Trevor Iha, FNP 06/26/22 1549

## 2022-06-26 NOTE — ED Triage Notes (Addendum)
Pt presents to Urgent Care with c/o fatigue and decreased urine output x 4 days. Dysuria and bilateral flank pain since yesterday, blood clot noted in urine this AM. Fever at onset of s/s, and also c/o intermittently feeling "loopy." One kidney stone in the past. States someone in household has c-diff UTI.

## 2022-06-26 NOTE — Discharge Instructions (Addendum)
Advised patient to take medication as directed with food to completion.  Encouraged increase daily water intake to 64 ounces per day. Advised patient we will follow-up with urine culture results once returned.  Advised if symptoms worsen and or unresolved please follow-up with PCP or here for further evaluation.

## 2022-06-27 ENCOUNTER — Telehealth: Payer: Self-pay

## 2022-06-27 NOTE — Telephone Encounter (Signed)
Pt not much improved since yesterday however was not able to get meds til this am. Is going to start immediately and f/u with PCP or here for further eval if no improvement over next few days.

## 2022-06-29 LAB — URINE CULTURE: Culture: >=100000 — AB

## 2022-07-20 ENCOUNTER — Ambulatory Visit (INDEPENDENT_AMBULATORY_CARE_PROVIDER_SITE_OTHER): Payer: BC Managed Care – PPO

## 2022-07-20 DIAGNOSIS — Z7901 Long term (current) use of anticoagulants: Secondary | ICD-10-CM | POA: Diagnosis not present

## 2022-07-20 LAB — POCT INR: INR: 2 (ref 2.0–3.0)

## 2022-07-20 NOTE — Progress Notes (Signed)
I have reviewed and agree with note, evaluation, plan.   Kemba Hoppes, MD  

## 2022-07-20 NOTE — Patient Instructions (Addendum)
Pre visit review using our clinic review tool, if applicable. No additional management support is needed unless otherwise documented below in the visit note.  Continue 6mg (1 tablet) daily except take 0 mg on Wednesdays. Recheck in 6 weeks. 

## 2022-07-20 NOTE — Progress Notes (Signed)
Continue 6mg (1 tablet) daily except take 0 mg on Wednesdays. Recheck in 6 weeks. 

## 2022-08-29 ENCOUNTER — Ambulatory Visit (INDEPENDENT_AMBULATORY_CARE_PROVIDER_SITE_OTHER): Payer: BC Managed Care – PPO | Admitting: Family Medicine

## 2022-08-29 ENCOUNTER — Encounter: Payer: Self-pay | Admitting: Family Medicine

## 2022-08-29 VITALS — BP 131/87 | HR 76 | Temp 97.7°F | Wt 250.4 lb

## 2022-08-29 DIAGNOSIS — R1031 Right lower quadrant pain: Secondary | ICD-10-CM | POA: Diagnosis not present

## 2022-08-29 DIAGNOSIS — R7303 Prediabetes: Secondary | ICD-10-CM | POA: Diagnosis not present

## 2022-08-29 DIAGNOSIS — N12 Tubulo-interstitial nephritis, not specified as acute or chronic: Secondary | ICD-10-CM | POA: Diagnosis not present

## 2022-08-29 DIAGNOSIS — K760 Fatty (change of) liver, not elsewhere classified: Secondary | ICD-10-CM

## 2022-08-29 DIAGNOSIS — E782 Mixed hyperlipidemia: Secondary | ICD-10-CM | POA: Diagnosis not present

## 2022-08-29 DIAGNOSIS — D6869 Other thrombophilia: Secondary | ICD-10-CM

## 2022-08-29 LAB — LIPID PANEL
Cholesterol: 171 mg/dL (ref 0–200)
HDL: 48.7 mg/dL (ref >39.00)
NonHDL: 121.98
Total CHOL/HDL Ratio: 4
Triglycerides: 205 mg/dL — ABNORMAL HIGH (ref 0.0–149.0)
VLDL: 41 mg/dL — ABNORMAL HIGH (ref 0.0–40.0)

## 2022-08-29 LAB — HEPATIC FUNCTION PANEL
ALT: 25 U/L (ref 0–53)
AST: 19 U/L (ref 0–37)
Albumin: 3.9 g/dL (ref 3.5–5.2)
Alkaline Phosphatase: 71 U/L (ref 39–117)
Bilirubin, Direct: 0.1 mg/dL (ref 0.0–0.3)
Total Bilirubin: 0.5 mg/dL (ref 0.2–1.2)
Total Protein: 6.8 g/dL (ref 6.0–8.3)

## 2022-08-29 LAB — POCT GLYCOSYLATED HEMOGLOBIN (HGB A1C)
HbA1c POC (<> result, manual entry): 5.8 % (ref 4.0–5.6)
HbA1c, POC (controlled diabetic range): 5.8 % (ref 0.0–7.0)
HbA1c, POC (prediabetic range): 5.8 % (ref 5.7–6.4)
Hemoglobin A1C: 5.8 % (ref 4.0–5.6)

## 2022-08-29 NOTE — Progress Notes (Signed)
Patient ID: [name] [name], male  DOB: 1970-08-24, 52 y.o.   MRN: [id number] Patient Care Team    Relationship Specialty Notifications Start End  Ma Hillock, DO PCP - General Family Medicine  04/08/22     Chief Complaint  Patient presents with   Hyperlipidemia   Prediabetes    Subjective: [name] [name] is a 52 y.o. male present for Chronic Conditions/illness Management and new acute abd pain.   All past medical history, surgical history, allergies, family history, immunizations, medications and social history were updated in the electronic medical record today. All recent labs, ED visits and hospitalizations within the last year were reviewed.  Prediabetes/HLD/ fatty liver:Pt reports compliance with Lipitor 20 mg started last visit.  He is tolerating. He has lost weight,  A1c 6.3 at physical 3 months ago.  Patient was encouraged to implement routine exercise and dietary modifications.  RLQ pain: Pt reports today he had a bladder infection a few weeks ago and was seen in the ED. He noticed a bulge in the right lower quadrant of his abd, that he reports can be reduced by pushing it back in when it is protruding. He feels he has a hernia and is noticing pain with sneezing and changes in urinary pattern.     08/29/2022    8:24 AM 05/31/2022    8:44 AM 03/24/2022   11:18 AM 11/04/2020    9:26 AM 08/09/2019    4:19 PM  Depression screen PHQ 2/9  Decreased Interest 0 0 0 0 0  Down, Depressed, Hopeless 0 0 0 0 0  PHQ - 2 Score 0 0 0 0 0  Altered sleeping 0  1 0 0  Tired, decreased energy 1  1 0 1  Change in appetite 0  0 0 1  Feeling bad or failure about yourself  0  0 0 0  Trouble concentrating 0  1 0 0  Moving slowly or fidgety/restless 0  0 0 0  Suicidal thoughts 0  0 0 0  PHQ-9 Score 1  3 0 2  Difficult doing work/chores Not difficult at all   Not difficult at all Not difficult at all      08/29/2022    8:24 AM 03/24/2022   11:18 AM  GAD 7 : Generalized Anxiety Score   Nervous, Anxious, on Edge 0 1  Control/stop worrying 0 0  Worry too much - different things 0 0  Trouble relaxing 0 0  Restless 0 0  Easily annoyed or irritable 0 1  Afraid - awful might happen 0 1  Total GAD 7 Score 0 3  Anxiety Difficulty Not difficult at all           08/29/2022    8:21 AM  Fall Risk   Falls in the past year? 1  Injury with Fall? 0      Immunization History  Administered Date(s) Administered   Influenza Split 04/23/2012   Influenza,inj,Quad PF,6+ Mos 03/27/2013   Tdap 05/31/2022     Past Medical History:  Diagnosis Date   Allergy    Clotting disorder (HCC)    GERD (gastroesophageal reflux disease)    Heartburn    Hepatic steatosis    asymptomatic   Hyperlipidemia    Kidney stones    Low back pain on left side with sciatica 03/27/2013   Portal vein thrombosis    Ureterolithiasis 03/09/2012   Hx of on right 2009   Allergies  Allergen Reactions  Bee Venom Anaphylaxis   Doxycycline Hives   Past Surgical History:  Procedure Laterality Date   ANTERIOR CRUCIATE LIGAMENT REPAIR  12/2005   left    LASIK     left eye   Family History  Problem Relation Age of Onset   Hyperlipidemia Mother    Gallbladder disease Father    Dementia Father    Diabetes Maternal Grandmother    Stroke Maternal Grandmother    Deep vein thrombosis Maternal Aunt    Colon cancer Neg Hx    Social History   Social History Narrative   Not on file    Allergies as of 08/29/2022       Reactions   Bee Venom Anaphylaxis   Doxycycline Hives        Medication List        Accurate as of August 29, 2022 10:25 AM. If you have any questions, ask your nurse or doctor.          STOP taking these medications    albuterol 108 (90 Base) MCG/ACT inhaler Commonly known as: VENTOLIN HFA Stopped by: Howard Pouch, DO       TAKE these medications    atorvastatin 20 MG tablet Commonly known as: LIPITOR Take 1 tablet (20 mg total) by mouth at bedtime.    cetirizine 10 MG tablet Commonly known as: ZYRTEC Take 1 tablet (10 mg total) by mouth daily.   warfarin 6 MG tablet Commonly known as: COUMADIN Take as directed by the anticoagulation clinic. If you are unsure how to take this medication, talk to your nurse or doctor. Original instructions: TAKE 1 TABLET DAILY EXCEPT TAKE ZERO TABLETS ON WEDNESDAYS OR AS DIRECTED BY ANTICOAGULATION CLINIC       All past medical history, surgical history, allergies, family history, immunizations andmedications were updated in the EMR today and reviewed under the history and medication portions of their EMR.       CT Angio Chest W/Cm &/Or Wo Cm Result Date: 03/24/2022 IMPRESSION: 1. Negative for acute pulmonary embolism. 2. Small airway infection/inflammation in the left lower lobe and lingula.   ROS 14 pt review of systems performed and negative (unless mentioned in an HPI)  Objective: BP 131/87   Pulse 76   Temp 97.7 F (36.5 C)   Wt 250 lb 6.4 oz (113.6 kg)   SpO2 99%   BMI 33.04 kg/m  Physical Exam Vitals and nursing note reviewed.  Constitutional:      General: He is not in acute distress.    Appearance: Normal appearance. He is not ill-appearing, toxic-appearing or diaphoretic.  HENT:     Head: Normocephalic and atraumatic.  Eyes:     General: No scleral icterus.       Right eye: No discharge.        Left eye: No discharge.     Extraocular Movements: Extraocular movements intact.     Pupils: Pupils are equal, round, and reactive to light.  Cardiovascular:     Rate and Rhythm: Normal rate and regular rhythm.  Pulmonary:     Effort: Pulmonary effort is normal. No respiratory distress.     Breath sounds: Normal breath sounds. No wheezing, rhonchi or rales.  Abdominal:     General: Bowel sounds are normal.     Palpations: Abdomen is soft. There is no mass.     Tenderness: There is abdominal tenderness. There is no guarding or rebound.     Hernia: A hernia (right inguinal) is  present.  Musculoskeletal:     Right lower leg: No edema.     Left lower leg: No edema.  Skin:    General: Skin is warm.     Findings: No rash.  Neurological:     Mental Status: He is alert and oriented to person, place, and time. Mental status is at baseline.  Psychiatric:        Mood and Affect: Mood normal.        Behavior: Behavior normal.        Thought Content: Thought content normal.        Judgment: Judgment normal.    No results found.  Assessment/plan: [name] [name] is a 52 y.o. male present for Chronic Conditions/illness Management with new acute concern of abd pain Mixed hyperlipidemia/fatty liver Started Lipitor 20 mg last visit-tolerating Lipids collected today - diet and exercise Discussed cardiac CT screen if cholesterol still high. He is thinking about it.  Prediabetes: Did great! Lost weight!  A1c: 6.3> 5.9 A1C collected today  Portal vein thrombosis/Acquired thrombophilia (HCC)-warafin use - warfarin (COUMADIN) 6 MG tablet; TAKE 1 TABLET DAILY EXCEPT TAKE ZERO TABLETS ON WEDNESDAYS OR AS DIRECTED BY ANTICOAGULATION CLINIC  Dispense: 100 tablet; Refill: 3  RLQ pain: His HPI and exam is consistent with inguinal hernia on the right.  Will obtain CT abdomen pelvis to further evaluate and consider referral to surgery. Reviewed emergent precautions with patient today. Patient will be called with results and further plan.  Return in about 9 months (around 06/09/2023) for cpe (20 min), Routine chronic condition follow-up.   Orders Placed This Encounter  Procedures   CT Abdomen Pelvis W Contrast   Lipid panel   Hepatic function panel   POCT glycosylated hemoglobin (Hb A1C)   No orders of the defined types were placed in this encounter.  Referral Orders  No referral(s) requested today   46 minutes was spent with patient today covering multiple chronic conditions as well as new acute concern of right lower quadrant pain requiring ordering of advanced  imaging possible surgical referral.  Note is dictated utilizing voice recognition software. Although note has been proof read prior to signing, occasional typographical errors still can be missed. If any questions arise, please do not hesitate to call for verification.  Electronically signed by: Howard Pouch, DO [place]

## 2022-08-29 NOTE — Patient Instructions (Addendum)
Return in about 9 months (around 06/09/2023) for cpe (20 min), Routine chronic condition follow-up.        Great to see you today.  I have refilled the medication(s) we provide.   If labs were collected, we will inform you of lab results once received either by echart message or telephone call.   - echart message- for normal results that have been seen by the patient already.   - telephone call: abnormal results or if patient has not viewed results in their echart.

## 2022-08-30 ENCOUNTER — Ambulatory Visit (HOSPITAL_BASED_OUTPATIENT_CLINIC_OR_DEPARTMENT_OTHER)
Admission: RE | Admit: 2022-08-30 | Discharge: 2022-08-30 | Disposition: A | Discharge status: Home / Self Care | Payer: BC Managed Care – PPO | Source: Ambulatory Visit | Attending: Family Medicine | Admitting: Family Medicine

## 2022-08-30 ENCOUNTER — Other Ambulatory Visit (HOSPITAL_BASED_OUTPATIENT_CLINIC_OR_DEPARTMENT_OTHER): Payer: Self-pay | Admitting: Family Medicine

## 2022-08-30 DIAGNOSIS — R1031 Right lower quadrant pain: Secondary | ICD-10-CM

## 2022-08-30 DIAGNOSIS — K760 Fatty (change of) liver, not elsewhere classified: Secondary | ICD-10-CM

## 2022-08-30 DIAGNOSIS — D6869 Other thrombophilia: Secondary | ICD-10-CM

## 2022-08-30 DIAGNOSIS — E782 Mixed hyperlipidemia: Secondary | ICD-10-CM

## 2022-08-30 DIAGNOSIS — R7303 Prediabetes: Secondary | ICD-10-CM

## 2022-08-30 LAB — LDL CHOLESTEROL, DIRECT: Direct LDL: 101 mg/dL

## 2022-08-30 MED ORDER — IOHEXOL 300 MG/ML  SOLN
125.0000 mL | Freq: Once | INTRAMUSCULAR | Status: AC | PRN
  Administered 2022-08-30: 125 mL via INTRAVENOUS

## 2022-08-31 ENCOUNTER — Telehealth: Payer: Self-pay | Admitting: Family Medicine

## 2022-08-31 ENCOUNTER — Ambulatory Visit (INDEPENDENT_AMBULATORY_CARE_PROVIDER_SITE_OTHER): Payer: BC Managed Care – PPO | Admitting: Family Medicine

## 2022-08-31 ENCOUNTER — Other Ambulatory Visit (INDEPENDENT_AMBULATORY_CARE_PROVIDER_SITE_OTHER): Payer: BC Managed Care – PPO

## 2022-08-31 DIAGNOSIS — K4090 Unilateral inguinal hernia, without obstruction or gangrene, not specified as recurrent: Secondary | ICD-10-CM

## 2022-08-31 DIAGNOSIS — Z7901 Long term (current) use of anticoagulants: Secondary | ICD-10-CM

## 2022-08-31 DIAGNOSIS — N12 Tubulo-interstitial nephritis, not specified as acute or chronic: Secondary | ICD-10-CM | POA: Diagnosis not present

## 2022-08-31 DIAGNOSIS — I81 Portal vein thrombosis: Secondary | ICD-10-CM

## 2022-08-31 DIAGNOSIS — N3289 Other specified disorders of bladder: Secondary | ICD-10-CM

## 2022-08-31 LAB — BASIC METABOLIC PANEL
BUN: 14 mg/dL (ref 6–23)
CO2: 18 mEq/L — ABNORMAL LOW (ref 19–32)
Calcium: 9.6 mg/dL (ref 8.4–10.5)
Chloride: 113 mEq/L — ABNORMAL HIGH (ref 96–112)
Creatinine, Ser: 1.16 mg/dL (ref 0.40–1.50)
GFR: 73.69 mL/min (ref >60.00)
Glucose, Bld: 125 mg/dL — ABNORMAL HIGH (ref 70–99)
Potassium: 4.6 mEq/L (ref 3.5–5.1)
Sodium: 151 mEq/L — ABNORMAL HIGH (ref 135–145)

## 2022-08-31 LAB — POCT INR: INR: 2.4 (ref 2.0–3.0)

## 2022-08-31 MED ORDER — CIPROFLOXACIN HCL 500 MG PO TABS: 500.0000 mg | ORAL_TABLET | Freq: Two times a day (BID) | ORAL | 0 refills | Status: AC

## 2022-08-31 NOTE — Telephone Encounter (Signed)
Pt asked to be called back before finishing results

## 2022-08-31 NOTE — Progress Notes (Signed)
Continue 66m (1 tablet) daily except take 0 mg on Wednesdays. Recheck in 6 weeks.

## 2022-08-31 NOTE — Patient Instructions (Addendum)
Pre visit review using our clinic review tool, if applicable. No additional management support is needed unless otherwise documented below in the visit note.  Continue 60m (1 tablet) daily except take 0 mg on Wednesdays. Recheck in 6 weeks.

## 2022-08-31 NOTE — Telephone Encounter (Signed)
  Please call the lab and see if they can add on a CBC and a BMP to yesterday's labs.  If not patient will need to come in today to have these repeated along with a urine culture.  (Orders will need to be placed)  Please call patient urgently  His CT showed a significant herniation in the right lower quadrant and it does contain part of his bladder. It also appears he may have an infection in his kidney. Assuming the infection is stemming from the bladder infection he had in December, I have called in an antibiotic for him to take twice daily for 10 days.  I have also urgently referred him to [facility] surgery.  I would encourage him to call them today to expedite referral.  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   Lastly, if he finds any skin changes over this area looking purpleish/reddish, increased pain, fever or difficulty emptying urinary bladder he needs to be seen emergently in the emergency room to expedite surgical correction.

## 2022-08-31 NOTE — Progress Notes (Signed)
I have reviewed and agree with note, evaluation, plan.   Janina Trafton, MD  

## 2022-08-31 NOTE — Telephone Encounter (Signed)
LM for pt to return call to discuss.  

## 2022-09-01 ENCOUNTER — Other Ambulatory Visit: Payer: BC Managed Care – PPO

## 2022-09-01 ENCOUNTER — Other Ambulatory Visit: Payer: Self-pay

## 2022-09-01 DIAGNOSIS — R1031 Right lower quadrant pain: Secondary | ICD-10-CM

## 2022-09-01 DIAGNOSIS — N12 Tubulo-interstitial nephritis, not specified as acute or chronic: Secondary | ICD-10-CM

## 2022-09-02 ENCOUNTER — Telehealth: Payer: Self-pay | Admitting: Family Medicine

## 2022-09-02 LAB — CBC WITH DIFFERENTIAL/PLATELET
Basophils Absolute: 0.1 10*3/uL (ref 0.0–0.1)
Basophils Relative: 0.8 % (ref 0.0–3.0)
Eosinophils Absolute: 0.2 10*3/uL (ref 0.0–0.7)
Eosinophils Relative: 2 % (ref 0.0–5.0)
HCT: 42.9 % (ref 39.0–52.0)
Hemoglobin: 14.3 g/dL (ref 13.0–17.0)
Lymphocytes Relative: 31.6 % (ref 12.0–46.0)
Lymphs Abs: 2.5 10*3/uL (ref 0.7–4.0)
MCHC: 33.3 g/dL (ref 30.0–36.0)
MCV: 96.4 fl (ref 78.0–100.0)
Monocytes Absolute: 1 10*3/uL (ref 0.1–1.0)
Monocytes Relative: 13.1 % — ABNORMAL HIGH (ref 3.0–12.0)
Neutro Abs: 4.1 10*3/uL (ref 1.4–7.7)
Neutrophils Relative %: 52.5 % (ref 43.0–77.0)
Platelets: 263 10*3/uL (ref 150.0–400.0)
RBC: 4.45 Mil/uL (ref 4.22–5.81)
RDW: 15.1 % (ref 11.5–15.5)
WBC: 7.8 10*3/uL (ref 4.0–10.5)

## 2022-09-02 LAB — URINE CULTURE
MICRO NUMBER:: 14601776
Result:: NO GROWTH
SPECIMEN QUALITY:: ADEQUATE

## 2022-09-02 NOTE — Telephone Encounter (Signed)
Please call patient: (Patient must be spoken to) His blood cell counts are normal, which is reassuring There was a result note generated yesterday and through the BMP result, please make sure he is in complete understanding of my concern.  If he is worsening over the weekend he needs to get to the emergency room. If he is not scheduled with surgery as of yet, then I do want to follow-up with him next week to recheck his lab work.

## 2022-09-02 NOTE — Telephone Encounter (Addendum)
LVM for pt to CB regarding results.  

## 2022-09-05 NOTE — Telephone Encounter (Signed)
Pt returned call regarding results. He cannot be seen until 3/13. Advised to schedule follow up with provider. Appt scheduled 2/29 8:40

## 2022-09-08 ENCOUNTER — Encounter: Payer: Self-pay | Admitting: Family Medicine

## 2022-09-08 ENCOUNTER — Ambulatory Visit (INDEPENDENT_AMBULATORY_CARE_PROVIDER_SITE_OTHER): Payer: BC Managed Care – PPO | Admitting: Family Medicine

## 2022-09-08 VITALS — BP 119/83 | HR 61 | Temp 97.6°F | Wt 251.4 lb

## 2022-09-08 DIAGNOSIS — K4090 Unilateral inguinal hernia, without obstruction or gangrene, not specified as recurrent: Secondary | ICD-10-CM | POA: Diagnosis not present

## 2022-09-08 DIAGNOSIS — E870 Hyperosmolality and hypernatremia: Secondary | ICD-10-CM | POA: Diagnosis not present

## 2022-09-08 DIAGNOSIS — K429 Umbilical hernia without obstruction or gangrene: Secondary | ICD-10-CM

## 2022-09-08 DIAGNOSIS — N3289 Other specified disorders of bladder: Secondary | ICD-10-CM

## 2022-09-08 LAB — BASIC METABOLIC PANEL
BUN: 20 mg/dL (ref 6–23)
CO2: 24 mEq/L (ref 19–32)
Calcium: 9.4 mg/dL (ref 8.4–10.5)
Chloride: 106 mEq/L (ref 96–112)
Creatinine, Ser: 1.01 mg/dL (ref 0.40–1.50)
GFR: 87 mL/min (ref >60.00)
Glucose, Bld: 106 mg/dL — ABNORMAL HIGH (ref 70–99)
Potassium: 4.2 mEq/L (ref 3.5–5.1)
Sodium: 139 mEq/L (ref 135–145)

## 2022-09-08 NOTE — Progress Notes (Signed)
Patient ID: [name] [name], male  DOB: May 17, 1971, 52 y.o.   MRN: [id number] Patient Care Team    Relationship Specialty Notifications Start End  Ma Hillock, DO PCP - General Family Medicine  04/08/22     Chief Complaint  Patient presents with   Pyelonephritis    Subjective: [name] [name] is a 52 y.o. male present for Chronic Conditions/illness Management and new acute abd pain.   All past medical history, surgical history, allergies, family history, immunizations, medications and social history were updated in the electronic medical record today. All recent labs, ED visits and hospitalizations within the last year were reviewed.  RLQ pain: Patient reports today the discomfort is still present in his right lower quadrant but not excruciating.  He is not needing to take anything for the pain.  He feels he is urinating is normal.  CT resulted with right inguinal hernia containing bladder.  Possible pyelonephritis which did not correlate with patient's symptoms, blood work or urinalysis.  Urinalysis, CBC, kidney function, liver function and inflammatory marker were all normal.  He was found to be hypernatremic hyperchloremic his labs.  He states he feels perfectly fine, minus the discomfort in his right lower quadrant.  He is urinating is normal.  He does admit he was not taking in as much water as he was told to do.  His bowel movements essentially are normal, but he has had less frequent bowel movements. He has surgery consult scheduled in 2 weeks  Prior note: Pt reports today he had a bladder infection a few weeks ago and was seen in the ED. He noticed a bulge in the right lower quadrant of his abd, that he reports can be reduced by pushing it back in when it is protruding. He feels he has a hernia and is noticing pain with sneezing and changes in urinary pattern.   CT ABDOMEN PELVIS W CONTRAST Result Date: 08/30/2022 IMPRESSION: 1. Findings concerning for left pyelonephritis.  Correlation with urinalysis recommended. No drainable fluid collection or abscess. 2. 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 3. Colonic diverticulosis. No bowel obstruction. Normal appendix. 4. Mild fatty liver. Electronically Signed   By: Anner Crete M.D.   On: 08/30/2022 21:55        08/29/2022    8:24 AM 05/31/2022    8:44 AM 03/24/2022   11:18 AM 11/04/2020    9:26 AM 08/09/2019    4:19 PM  Depression screen PHQ 2/9  Decreased Interest 0 0 0 0 0  Down, Depressed, Hopeless 0 0 0 0 0  PHQ - 2 Score 0 0 0 0 0  Altered sleeping 0  1 0 0  Tired, decreased energy 1  1 0 1  Change in appetite 0  0 0 1  Feeling bad or failure about yourself  0  0 0 0  Trouble concentrating 0  1 0 0  Moving slowly or fidgety/restless 0  0 0 0  Suicidal thoughts 0  0 0 0  PHQ-9 Score 1  3 0 2  Difficult doing work/chores Not difficult at all   Not difficult at all Not difficult at all      08/29/2022    8:24 AM 03/24/2022   11:18 AM  GAD 7 : Generalized Anxiety Score  Nervous, Anxious, on Edge 0 1  Control/stop worrying 0 0  Worry too much - different things 0 0  Trouble relaxing 0 0  Restless 0 0  Easily annoyed or irritable 0 1  Afraid - awful might happen 0 1  Total GAD 7 Score 0 3  Anxiety Difficulty Not difficult at all           08/29/2022    8:21 AM  [place] in the past year? 1  Injury with Fall? 0      Immunization History  Administered Date(s) Administered   Influenza Split 04/23/2012   Influenza,inj,Quad PF,6+ Mos 03/27/2013   Tdap 05/31/2022     Past Medical History:  Diagnosis Date   Allergy    Clotting disorder (HCC)    GERD (gastroesophageal reflux disease)    Heartburn    Hepatic steatosis    asymptomatic   Hyperlipidemia    Kidney stones    Low back pain on left side with sciatica 03/27/2013   Portal vein thrombosis    Ureterolithiasis 03/09/2012   Hx of on right 2009   Allergies  Allergen Reactions    Bee Venom Anaphylaxis   Doxycycline Hives   Past Surgical History:  Procedure Laterality Date   ANTERIOR CRUCIATE LIGAMENT REPAIR  12/2005   left    LASIK     left eye   Family History  Problem Relation Age of Onset   Hyperlipidemia Mother    Gallbladder disease Father    Dementia Father    Diabetes Maternal Grandmother    Stroke Maternal Grandmother    Deep vein thrombosis Maternal Aunt    Colon cancer Neg Hx    Social History   Social History Narrative   Not on file    Allergies as of 09/08/2022       Reactions   Bee Venom Anaphylaxis   Doxycycline Hives        Medication List        Accurate as of September 08, 2022  9:40 AM. If you have any questions, ask your nurse or doctor.          atorvastatin 20 MG tablet Commonly known as: LIPITOR Take 1 tablet (20 mg total) by mouth at bedtime.   cetirizine 10 MG tablet Commonly known as: ZYRTEC Take 1 tablet (10 mg total) by mouth daily.   ciprofloxacin 500 MG tablet Commonly known as: Cipro Take 1 tablet (500 mg total) by mouth 2 (two) times daily for 10 days.   warfarin 6 MG tablet Commonly known as: COUMADIN Take as directed by the anticoagulation clinic. If you are unsure how to take this medication, talk to your nurse or doctor. Original instructions: TAKE 1 TABLET DAILY EXCEPT TAKE ZERO TABLETS ON WEDNESDAYS OR AS DIRECTED BY ANTICOAGULATION CLINIC       All past medical history, surgical history, allergies, family history, immunizations andmedications were updated in the EMR today and reviewed under the history and medication portions of their EMR.      Review of Systems  Constitutional:  Negative for chills, diaphoresis, fever, malaise/fatigue and weight loss.  HENT: Negative.    Eyes: Negative.   Respiratory: Negative.    Cardiovascular: Negative.   Gastrointestinal:  Positive for abdominal pain and constipation. Negative for blood in stool, diarrhea, melena, nausea and vomiting.   Genitourinary:  Negative for dysuria, flank pain, frequency, hematuria and urgency.  Musculoskeletal:  Negative for myalgias.  Skin: Negative.   Neurological:  Negative for dizziness, tingling, tremors, sensory change, focal weakness, seizures, loss of consciousness, weakness and headaches.  Endo/Heme/Allergies: Negative.   Psychiatric/Behavioral: Negative.  14 pt review of systems performed and negative (unless mentioned in an HPI)  Objective: BP 119/83   Pulse 61   Temp 97.6 F (36.4 C)   Wt 251 lb 6.4 oz (114 kg)   SpO2 98%   BMI 33.17 kg/m  Physical Exam Vitals and nursing note reviewed.  Constitutional:      General: He is not in acute distress.    Appearance: Normal appearance. He is not ill-appearing, toxic-appearing or diaphoretic.  HENT:     Head: Normocephalic and atraumatic.  Eyes:     General: No scleral icterus.       Right eye: No discharge.        Left eye: No discharge.     Extraocular Movements: Extraocular movements intact.     Pupils: Pupils are equal, round, and reactive to light.  Cardiovascular:     Rate and Rhythm: Normal rate and regular rhythm.  Pulmonary:     Effort: Pulmonary effort is normal. No respiratory distress.     Breath sounds: Normal breath sounds. No wheezing, rhonchi or rales.  Abdominal:     General: Bowel sounds are normal.     Palpations: Abdomen is soft. There is no mass.     Tenderness: There is no abdominal tenderness. There is no guarding or rebound.     Hernia: A hernia (right inguinal) is present.  Musculoskeletal:     Right lower leg: No edema.     Left lower leg: No edema.  Skin:    General: Skin is warm.     Findings: No rash.  Neurological:     Mental Status: He is alert and oriented to person, place, and time. Mental status is at baseline.  Psychiatric:        Mood and Affect: Mood normal.        Behavior: Behavior normal.        Thought Content: Thought content normal.        Judgment: Judgment normal.     No results found.  Assessment/plan: Alvina Chou  Hypernatremia ? Related to bladder herniation vs false result.  He does not feel or appear ill, other than the discomfort in his right lower quadrant. - Basic Metabolic Panel (BMET)  Right inguinal hernia/Bladder hernia to right inguinal canal Unfortunately, he does not have his surgical consult for another 2 weeks.  I strongly encouraged him if for any reason he has a fever, chills, decreased urine output or increased pain, he needs to be seen in the ED immediately.  He reports understanding.  Left inguinal hernia-small.fat containing. Small fat containing only Umbilical hernia. small-fat containing Small fat containing only   No follow-ups on file.   Orders Placed This Encounter  Procedures   Basic Metabolic Panel (BMET)   No orders of the defined types were placed in this encounter.  Referral Orders  No referral(s) requested today    Note is dictated utilizing voice recognition software. Although note has been proof read prior to signing, occasional typographical errors still can be missed. If any questions arise, please do not hesitate to call for verification.  Electronically signed by: Howard Pouch, DO [place]

## 2022-09-08 NOTE — Patient Instructions (Signed)
No follow-ups on file.        Great to see you today.  I have refilled the medication(s) we provide.   If labs were collected, we will inform you of lab results once received either by echart message or telephone call.   - echart message- for normal results that have been seen by the patient already.   - telephone call: abnormal results or if patient has not viewed results in their echart.  

## 2022-10-06 ENCOUNTER — Encounter: Payer: Self-pay | Admitting: Family Medicine

## 2022-10-06 ENCOUNTER — Ambulatory Visit (INDEPENDENT_AMBULATORY_CARE_PROVIDER_SITE_OTHER): Payer: BC Managed Care – PPO | Admitting: Family Medicine

## 2022-10-06 VITALS — BP 123/84 | HR 79 | Temp 97.7°F | Wt 252.4 lb

## 2022-10-06 DIAGNOSIS — I81 Portal vein thrombosis: Secondary | ICD-10-CM | POA: Diagnosis not present

## 2022-10-06 DIAGNOSIS — N3289 Other specified disorders of bladder: Secondary | ICD-10-CM | POA: Diagnosis not present

## 2022-10-06 DIAGNOSIS — Z01818 Encounter for other preprocedural examination: Secondary | ICD-10-CM

## 2022-10-06 DIAGNOSIS — K4090 Unilateral inguinal hernia, without obstruction or gangrene, not specified as recurrent: Secondary | ICD-10-CM

## 2022-10-06 DIAGNOSIS — K429 Umbilical hernia without obstruction or gangrene: Secondary | ICD-10-CM | POA: Diagnosis not present

## 2022-10-06 NOTE — Progress Notes (Signed)
[name] [name] , 08/06/1970, 52 y.o., male MRN: [id number] Patient Care Team    Relationship Specialty Notifications Start End  Ma Hillock, DO PCP - General Family Medicine  04/08/22     Chief Complaint  Patient presents with   surgical clearance     Subjective: KOLSON SARAFIN is a 52 y.o. Pt presents for surgical clearance Procedure:bilateral inguinal hernia repair Indication: bilateral inguinal hernia, including bladder herniation.  Anesthesia:General anesthesia Surgery type risk:   - Intermediate risk= abdominal Prior anesthesia complications:None reported Family history of prior anesthesia complications:none reported Cardiac:    - CBD, PAD, stroke, MI, aortic stenosis: negative history   - METs: > 4   - EKG: not required Pulmonary: no prior history of disease Endocrine: no history of diabetes Obesity:Body mass index is 33.3 kg/m. Chronic kidney disease:no prior history (labs 08/2022) Chronic med that needs to be continued: n/a Anticoagulation: coumadin for portal vein thrombosis in 2013 Labs UTD 08/2022    10/06/2022    8:59 AM 08/29/2022    8:24 AM 05/31/2022    8:44 AM 03/24/2022   11:18 AM 11/04/2020    9:26 AM  Depression screen PHQ 2/9  Decreased Interest 0 0 0 0 0  Down, Depressed, Hopeless 0 0 0 0 0  PHQ - 2 Score 0 0 0 0 0  Altered sleeping 0 0  1 0  Tired, decreased energy 0 1  1 0  Change in appetite 0 0  0 0  Feeling bad or failure about yourself  0 0  0 0  Trouble concentrating 0 0  1 0  Moving slowly or fidgety/restless 0 0  0 0  Suicidal thoughts 0 0  0 0  PHQ-9 Score 0 1  3 0  Difficult doing work/chores  Not difficult at all   Not difficult at all    Allergies  Allergen Reactions   Bee Venom Anaphylaxis   Doxycycline Hives   Social History   Social History Narrative   Not on file   Past Medical History:  Diagnosis Date   Allergy    Clotting disorder (HCC)    GERD (gastroesophageal reflux disease)    Heartburn    Hepatic  steatosis    asymptomatic   Hyperlipidemia    Kidney stones    Low back pain on left side with sciatica 03/27/2013   Portal vein thrombosis    Ureterolithiasis 03/09/2012   Hx of on right 2009   Past Surgical History:  Procedure Laterality Date   ANTERIOR CRUCIATE LIGAMENT REPAIR  12/2005   left    LASIK     left eye   Family History  Problem Relation Age of Onset   Hyperlipidemia Mother    Gallbladder disease Father    Dementia Father    Diabetes Maternal Grandmother    Stroke Maternal Grandmother    Deep vein thrombosis Maternal Aunt    Colon cancer Neg Hx    Allergies as of 10/06/2022       Reactions   Bee Venom Anaphylaxis   Doxycycline Hives        Medication List        Accurate as of October 06, 2022 11:59 PM. If you have any questions, ask your nurse or doctor.          atorvastatin 20 MG tablet Commonly known as: LIPITOR Take 1 tablet (20 mg total) by mouth at bedtime.   cetirizine 10 MG tablet  Commonly known as: ZYRTEC Take 1 tablet (10 mg total) by mouth daily.   enoxaparin 40 MG/0.4ML injection Commonly known as: LOVENOX Inject 1.1 mLs (110 mg total) into the skin every 12 (twelve) hours for 5 doses. Start on 3rd day after stopping coumadin. Inject every 12 hours. Last dose the morning prior to surgery. Started by: Howard Pouch, DO   warfarin 6 MG tablet Commonly known as: COUMADIN Take as directed by the anticoagulation clinic. If you are unsure how to take this medication, talk to your nurse or doctor. Original instructions: TAKE 1 TABLET DAILY EXCEPT TAKE ZERO TABLETS ON WEDNESDAYS OR AS DIRECTED BY ANTICOAGULATION CLINIC        All past medical history, surgical history, allergies, family history, immunizations andmedications were updated in the EMR today and reviewed under the history and medication portions of their EMR.     ROS Negative, with the exception of above mentioned in HPI   Objective:  BP 123/84   Pulse 79   Temp  97.7 F (36.5 C)   Wt 252 lb 6.4 oz (114.5 kg)   SpO2 99%   BMI 33.30 kg/m  Body mass index is 33.3 kg/m. Physical Exam Vitals and nursing note reviewed.  Constitutional:      General: He is not in acute distress.    Appearance: Normal appearance. He is not ill-appearing, toxic-appearing or diaphoretic.  HENT:     Head: Normocephalic and atraumatic.  Eyes:     General: No scleral icterus.       Right eye: No discharge.        Left eye: No discharge.     Extraocular Movements: Extraocular movements intact.     Pupils: Pupils are equal, round, and reactive to light.  Cardiovascular:     Rate and Rhythm: Normal rate and regular rhythm.  Pulmonary:     Effort: Pulmonary effort is normal. No respiratory distress.     Breath sounds: Normal breath sounds. No wheezing, rhonchi or rales.  Musculoskeletal:     Right lower leg: No edema.     Left lower leg: No edema.  Skin:    General: Skin is warm.     Findings: No rash.  Neurological:     Mental Status: He is alert and oriented to person, place, and time. Mental status is at baseline.  Psychiatric:        Mood and Affect: Mood normal.        Behavior: Behavior normal.        Thought Content: Thought content normal.        Judgment: Judgment normal.     No results found. No results found. No results found for this or any previous visit (from the past 24 hour(s)).  Assessment/Plan: EVREN ABRAHA is a 52 y.o. male present for OV for  Portal vein thrombosis Pt will require lovenox bridge prior to surgery.  Will need lovenox bridge for surgery:     - stop coumadin 5 d prior to surgery    - start lovenox 2 d after stopping coumadin and stop 24 hours before surgery. Then resume 24 hours after surgery,  if no bleeding,  until coumadin is therapeutic. Pre-operative clearance/Bladder hernia in male//Right inguinal hernia/Left inguinal hernia-small.fat containing/Umbilical hernia. small-fat containing To the best of my knowledge  and per patients reported PMH, there is not a medical contraindication for undergoing surgery. He is of low/average risk for medical/cardiac complications  Patient understands the purpose of preoperative visit is  to attempt to minimize surgical complications and communicate to surgical team chronic conditions and management. No patient is free of risk when undergoing a procedure. The decision about whether to proceed with the operation belongs to the surgeon and the patient. Patient's chronic conditions have been stable.  He is not anemic.  He is not a diabetic.     Reviewed expectations re: course of current medical issues. Discussed self-management of symptoms. Outlined signs and symptoms indicating need for more acute intervention. Patient verbalized understanding and all questions were answered. Patient received an After-Visit Summary.    Orders Placed This Encounter  Procedures   Urinalysis w microscopic + reflex cultur   REFLEXIVE URINE CULTURE   Meds ordered this encounter  Medications   enoxaparin (LOVENOX) 40 MG/0.4ML injection    Sig: Inject 1.1 mLs (110 mg total) into the skin every 12 (twelve) hours for 5 doses. Start on 3rd day after stopping coumadin. Inject every 12 hours. Last dose the morning prior to surgery.    Dispense:  5.5 mL    Refill:  0   Referral Orders  No referral(s) requested today     Note is dictated utilizing voice recognition software. Although note has been proof read prior to signing, occasional typographical errors still can be missed. If any questions arise, please do not hesitate to call for verification.   electronically signed by:  Howard Pouch, DO  [place]

## 2022-10-06 NOTE — Patient Instructions (Addendum)
Return if symptoms worsen or fail to improve.        Great to see you today.  I have refilled the medication(s) we provide.   If labs were collected, we will inform you of lab results once received either by echart message or telephone call.   - echart message- for normal results that have been seen by the patient already.   - telephone call: abnormal results or if patient has not viewed results in their echart.  

## 2022-10-07 LAB — URINALYSIS W MICROSCOPIC + REFLEX CULTURE
Bacteria, UA: NONE SEEN /HPF
Bilirubin Urine: NEGATIVE
Glucose, UA: NEGATIVE
Hgb urine dipstick: NEGATIVE
Hyaline Cast: NONE SEEN /LPF
Ketones, ur: NEGATIVE
Leukocyte Esterase: NEGATIVE
Nitrites, Initial: NEGATIVE
Protein, ur: NEGATIVE
Specific Gravity, Urine: 1.022 (ref 1.001–1.035)
Squamous Epithelial / HPF: NONE SEEN /HPF (ref <=5)
pH: 5.5 (ref 5.0–8.0)

## 2022-10-07 LAB — NO CULTURE INDICATED

## 2022-10-11 ENCOUNTER — Telehealth: Payer: Self-pay | Admitting: Family Medicine

## 2022-10-11 ENCOUNTER — Other Ambulatory Visit: Payer: Self-pay | Admitting: Family Medicine

## 2022-10-11 MED ORDER — ENOXAPARIN SODIUM 40 MG/0.4ML IJ SOSY: 110.0000 mg | PREFILLED_SYRINGE | Freq: Two times a day (BID) | INTRAMUSCULAR | 0 refills | Status: DC

## 2022-10-11 NOTE — Telephone Encounter (Signed)
Please print OV not from 3/28 and send to [place] surgery as requested. Supplied patient with lovenox bridge pre-surgery. Inpatient team and ortho will need to bridge him appropriately after surgery.

## 2022-10-11 NOTE — Telephone Encounter (Signed)
Patient requests [name] [name] call him to reschedule Coumadin Clinic Appointment

## 2022-10-11 NOTE — Telephone Encounter (Signed)
faxed

## 2022-10-12 ENCOUNTER — Ambulatory Visit (INDEPENDENT_AMBULATORY_CARE_PROVIDER_SITE_OTHER): Payer: BC Managed Care – PPO

## 2022-10-12 ENCOUNTER — Ambulatory Visit: Payer: BC Managed Care – PPO

## 2022-10-12 DIAGNOSIS — Z7901 Long term (current) use of anticoagulants: Secondary | ICD-10-CM

## 2022-10-12 LAB — POCT INR: INR: 3.1 — AB (ref 2.0–3.0)

## 2022-10-12 MED ORDER — ENOXAPARIN SODIUM 120 MG/0.8ML IJ SOSY: 120.0000 mg | PREFILLED_SYRINGE | Freq: Two times a day (BID) | INTRAMUSCULAR | 0 refills | Status: DC

## 2022-10-12 NOTE — Telephone Encounter (Signed)
Pt reports he may be able to go to [facility] today for INR check. Will contact him this afternoon to see if he can make apt for this afternoon.

## 2022-10-12 NOTE — Patient Instructions (Addendum)
Pre visit review using our clinic review tool, if applicable. No additional management support is needed unless otherwise documented below in the visit note.  Reduce dose tomorrow to take 3 mg (1/2 tablet) and then continue 6mg  (1 tablet) daily except take 0 mg on Wednesdays. Recheck in 4 weeks.

## 2022-10-12 NOTE — Progress Notes (Signed)
Pt reports increased alcohol intake. Will also have hernia surgery in the near future. Pt will let coumadin clinic know when the surgery is scheduled.

## 2022-10-18 ENCOUNTER — Ambulatory Visit: Payer: Self-pay | Admitting: Surgery

## 2022-10-18 NOTE — Progress Notes (Signed)
Surgery orders requested via Epic inbox. °

## 2022-10-20 NOTE — Progress Notes (Addendum)
COVID Vaccine Completed:  Date of COVID positive in last 90 days: PCP- Felix Pacini Cardiologist - [facility] Dales 03/08/2021  Medical clearance by Felix Pacini 10/06/22 in Epic Chest x-ray - 03/21/22 Epic EKG -  Stress Test -  ECHO -  Cardiac Cath -  Pacemaker/ICD device last checked: Spinal Cord Stimulator:  Bowel Prep -   Sleep Study -  CPAP -   Fasting Blood Sugar -  Checks Blood Sugar _____ times a day  Last dose of GLP1 agonist-  N/A GLP1 instructions:  N/A   Last dose of SGLT-2 inhibitors-  N/A SGLT-2 instructions: N/A   Blood Thinner Instructions: Coumadin- Stop 5 days. Stop Lovenox 2 days after stopping Coumadin and stop 24 hours before surgery. Then resume 24 hours after surgery, if no bleeding, until Coumadin therapeutic. Aspirin Instructions: Last Dose:  Activity level:  Can go up a flight of stairs and perform activities of daily living without stopping and without symptoms of chest pain or shortness of breath.  Able to exercise without symptoms  Unable to go up a flight of stairs without symptoms of     Anesthesia review: Yes- Clotting disorder  Patient denies shortness of breath, fever, cough and chest pain at PAT appointment  Patient verbalized understanding of instructions that were given to them at the PAT appointment. Patient was also instructed that they will need to review over the PAT instructions again at home before surgery.

## 2022-10-20 NOTE — Patient Instructions (Signed)
SURGICAL WAITING ROOM VISITATION  Patients having surgery or a procedure may have no more than 2 support people in the waiting area - these visitors may rotate.    Children under the age of 50 must have an adult with them who is not the patient.  Due to an increase in RSV and influenza rates and associated hospitalizations, children ages 37 and under may not visit patients in [facility] hospitals.  If the patient needs to stay at the hospital during part of their recovery, the visitor guidelines for inpatient rooms apply. Pre-op nurse will coordinate an appropriate time for 1 support person to accompany patient in pre-op.  This support person may not rotate.    Please refer to the [facility] website for the visitor guidelines for Inpatients (after your surgery is over and you are in a regular room).       Your procedure is scheduled on: 11/07/22   Report to [facility] Main Entrance    Report to admitting at 12:15 PM   Call this number if you have problems the morning of surgery [phone]   Do not eat food :After Midnight.   After Midnight you may have the following liquids until 1130 AM DAY OF SURGERY  Water Non-Citrus Juices (without pulp, NO RED-Apple, White grape, White cranberry) Black Coffee (NO MILK/CREAM OR CREAMERS, sugar ok)  Clear Tea (NO MILK/CREAM OR CREAMERS, sugar ok) regular and decaf                             Plain Jell-O (NO RED)                                           Fruit ices (not with fruit pulp, NO RED)                                     Popsicles (NO RED)                                                               Sports drinks like Gatorade (NO RED)                        If you have questions, please contact your surgeon's office.   FOLLOW BOWEL PREP AND ANY ADDITIONAL PRE OP INSTRUCTIONS YOU RECEIVED FROM YOUR SURGEON'S OFFICE!!!     Oral Hygiene is also important to reduce your risk of infection.                                     Remember - BRUSH YOUR TEETH THE MORNING OF SURGERY WITH YOUR REGULAR TOOTHPASTE  DENTURES WILL BE REMOVED PRIOR TO SURGERY PLEASE DO NOT APPLY "Poly grip" OR ADHESIVES!!!   Do NOT smoke after Midnight   Take these medicines the morning of surgery with A SIP OF WATER: Lipitor, Zyrtec These are anesthesia recommendations for holding your anticoagulants.  Please contact your prescribing  physician to confirm IF it is safe to hold your anticoagulants for this length of time.   Eliquis Apixaban   72 hours   Xarelto Rivaroxaban   72 hours  Plavix Clopidogrel   120 hours  Pletal Cilostazol   120 hours   DO NOT TAKE ANY ORAL DIABETIC MEDICATIONS DAY OF YOUR SURGERY  Bring CPAP mask and tubing day of surgery.                              You may not have any metal on your body including hair pins, jewelry, and body piercing             Do not wear make-up, lotions, powders, cologne, or deodorant Do not shave  48 hours prior to surgery.               Men may shave face and neck.   Do not bring valuables to the hospital. [facility] IS NOT             RESPONSIBLE   FOR VALUABLES.   Contacts, glasses, dentures or bridgework may not be worn into surgery.   DO NOT BRING YOUR HOME MEDICATIONS TO THE HOSPITAL. PHARMACY WILL DISPENSE MEDICATIONS LISTED ON YOUR MEDICATION LIST TO YOU DURING YOUR ADMISSION IN THE HOSPITAL!    Patients discharged on the day of surgery will not be allowed to drive home.  Someone NEEDS to stay with you for the first 24 hours after anesthesia.   Special Instructions: Bring a copy of your healthcare power of attorney and living will documents the day of surgery if you haven't scanned them before.              Please read over the following fact sheets you were given: IF YOU HAVE QUESTIONS ABOUT YOUR PRE-OP INSTRUCTIONS PLEASE CALL [phone] Fleet ContrasRachel   If you received a COVID test during your pre-op visit  it is requested that you wear a mask when out in  public, stay away from anyone that may not be feeling well and notify your surgeon if you develop symptoms. If you test positive for Covid or have been in contact with anyone that has tested positive in the last 10 days please notify you surgeon.    [facility] - Preparing for Surgery Before surgery, you can play an important role.  Because skin is not sterile, your skin needs to be as free of germs as possible.  You can reduce the number of germs on your skin by washing with CHG (chlorahexidine gluconate) soap before surgery.  CHG is an antiseptic cleaner which kills germs and bonds with the skin to continue killing germs even after washing. Please DO NOT use if you have an allergy to CHG or antibacterial soaps.  If your skin becomes reddened/irritated stop using the CHG and inform your nurse when you arrive at Short Stay. Do not shave (including legs and underarms) for at least 48 hours prior to the first CHG shower.  You may shave your face/neck.  Please follow these instructions carefully:  1.  Shower with CHG Soap the night before surgery and the  morning of surgery.  2.  If you choose to wash your hair, wash your hair first as usual with your normal  shampoo.  3.  After you shampoo, rinse your hair and body thoroughly to remove the shampoo.  4.  Use CHG as you would any other liquid soap.  You can apply chg directly to the skin and wash.  Gently with a scrungie or clean washcloth.  5.  Apply the CHG Soap to your body ONLY FROM THE NECK DOWN.   Do   not use on face/ open                           Wound or open sores. Avoid contact with eyes, ears mouth and   genitals (private parts).                       Wash face,  Genitals (private parts) with your normal soap.             6.  Wash thoroughly, paying special attention to the area where your    surgery  will be performed.  7.  Thoroughly rinse your body with warm water from the neck down.  8.  DO NOT shower/wash  with your normal soap after using and rinsing off the CHG Soap.                9.  Pat yourself dry with a clean towel.            10.  Wear clean pajamas.            11.  Place clean sheets on your bed the night of your first shower and do not  sleep with pets. Day of Surgery : Do not apply any lotions/deodorants the morning of surgery.  Please wear clean clothes to the hospital/surgery center.  FAILURE TO FOLLOW THESE INSTRUCTIONS MAY RESULT IN THE CANCELLATION OF YOUR SURGERY  PATIENT SIGNATURE_________________________________  NURSE SIGNATURE__________________________________  ________________________________________________________________________

## 2022-10-23 ENCOUNTER — Encounter (HOSPITAL_COMMUNITY): Payer: Self-pay

## 2022-10-23 ENCOUNTER — Observation Stay (HOSPITAL_COMMUNITY)
Admission: EM | Admit: 2022-10-23 | Discharge: 2022-10-25 | Disposition: A | Discharge status: Home / Self Care | Payer: BC Managed Care – PPO | Attending: Surgery | Admitting: Surgery

## 2022-10-23 ENCOUNTER — Emergency Department (HOSPITAL_COMMUNITY): Payer: BC Managed Care – PPO

## 2022-10-23 DIAGNOSIS — S37019A Minor contusion of unspecified kidney, initial encounter: Secondary | ICD-10-CM | POA: Diagnosis present

## 2022-10-23 DIAGNOSIS — Z7901 Long term (current) use of anticoagulants: Secondary | ICD-10-CM | POA: Insufficient documentation

## 2022-10-23 DIAGNOSIS — Y30XXXA Falling, jumping or pushed from a high place, undetermined intent, initial encounter: Secondary | ICD-10-CM | POA: Diagnosis not present

## 2022-10-23 DIAGNOSIS — S37091A Other injury of right kidney, initial encounter: Principal | ICD-10-CM | POA: Insufficient documentation

## 2022-10-23 LAB — I-STAT CHEM 8, ED
BUN: 22 mg/dL — ABNORMAL HIGH (ref 6–20)
Calcium, Ion: 1.23 mmol/L (ref 1.15–1.40)
Chloride: 108 mmol/L (ref 98–111)
Creatinine, Ser: 1.2 mg/dL (ref 0.61–1.24)
Glucose, Bld: 166 mg/dL — ABNORMAL HIGH (ref 70–99)
HCT: 38 % — ABNORMAL LOW (ref 39.0–52.0)
Hemoglobin: 12.9 g/dL — ABNORMAL LOW (ref 13.0–17.0)
Potassium: 3.9 mmol/L (ref 3.5–5.1)
Sodium: 141 mmol/L (ref 135–145)
TCO2: 18 mmol/L — ABNORMAL LOW (ref 22–32)

## 2022-10-23 LAB — CBC WITH DIFFERENTIAL/PLATELET
Abs Immature Granulocytes: 0.05 10*3/uL (ref 0.00–0.07)
Basophils Absolute: 0 10*3/uL (ref 0.0–0.1)
Basophils Relative: 0 %
Eosinophils Absolute: 0 10*3/uL (ref 0.0–0.5)
Eosinophils Relative: 0 %
HCT: 38.4 % — ABNORMAL LOW (ref 39.0–52.0)
Hemoglobin: 13 g/dL (ref 13.0–17.0)
Immature Granulocytes: 0 %
Lymphocytes Relative: 15 %
Lymphs Abs: 2 10*3/uL (ref 0.7–4.0)
MCH: 31.6 pg (ref 26.0–34.0)
MCHC: 33.9 g/dL (ref 30.0–36.0)
MCV: 93.4 fL (ref 80.0–100.0)
Monocytes Absolute: 0.8 10*3/uL (ref 0.1–1.0)
Monocytes Relative: 6 %
Neutro Abs: 10.3 10*3/uL — ABNORMAL HIGH (ref 1.7–7.7)
Neutrophils Relative %: 79 %
Platelets: 206 10*3/uL (ref 150–400)
RBC: 4.11 MIL/uL — ABNORMAL LOW (ref 4.22–5.81)
RDW: 13 % (ref 11.5–15.5)
WBC: 13.2 10*3/uL — ABNORMAL HIGH (ref 4.0–10.5)
nRBC: 0 % (ref 0.0–0.2)

## 2022-10-23 LAB — LACTIC ACID, PLASMA: Lactic Acid, Venous: 3.2 mmol/L (ref 0.5–1.9)

## 2022-10-23 LAB — COMPREHENSIVE METABOLIC PANEL
ALT: 32 U/L (ref 0–44)
AST: 34 U/L (ref 15–41)
Albumin: 3.9 g/dL (ref 3.5–5.0)
Alkaline Phosphatase: 61 U/L (ref 38–126)
Anion gap: 8 (ref 5–15)
BUN: 25 mg/dL — ABNORMAL HIGH (ref 6–20)
CO2: 19 mmol/L — ABNORMAL LOW (ref 22–32)
Calcium: 9.2 mg/dL (ref 8.9–10.3)
Chloride: 110 mmol/L (ref 98–111)
Creatinine, Ser: 1.28 mg/dL — ABNORMAL HIGH (ref 0.61–1.24)
GFR, Estimated: >60 mL/min (ref >60)
Glucose, Bld: 179 mg/dL — ABNORMAL HIGH (ref 70–99)
Potassium: 3.8 mmol/L (ref 3.5–5.1)
Sodium: 137 mmol/L (ref 135–145)
Total Bilirubin: 0.5 mg/dL (ref 0.3–1.2)
Total Protein: 7.1 g/dL (ref 6.5–8.1)

## 2022-10-23 LAB — HEMOGLOBIN AND HEMATOCRIT, BLOOD
HCT: 34.1 % — ABNORMAL LOW (ref 39.0–52.0)
Hemoglobin: 11.4 g/dL — ABNORMAL LOW (ref 13.0–17.0)

## 2022-10-23 LAB — ABO/RH: ABO/RH(D): A POS

## 2022-10-23 LAB — PROTIME-INR
INR: 1.9 — ABNORMAL HIGH (ref 0.8–1.2)
Prothrombin Time: 21.2 seconds — ABNORMAL HIGH (ref 11.4–15.2)

## 2022-10-23 LAB — HIV ANTIBODY (ROUTINE TESTING W REFLEX): HIV Screen 4th Generation wRfx: NONREACTIVE

## 2022-10-23 LAB — TYPE AND SCREEN
ABO/RH(D): A POS
Antibody Screen: NEGATIVE

## 2022-10-23 LAB — SAMPLE TO BLOOD BANK

## 2022-10-23 MED ORDER — MORPHINE SULFATE (PF) 4 MG/ML IV SOLN: 4.0000 mg | INTRAVENOUS | Status: DC | PRN

## 2022-10-23 MED ORDER — ONDANSETRON 4 MG PO TBDP: 4.0000 mg | ORAL_TABLET | Freq: Four times a day (QID) | ORAL | Status: DC | PRN

## 2022-10-23 MED ORDER — METOPROLOL TARTRATE 5 MG/5ML IV SOLN: 5.0000 mg | Freq: Four times a day (QID) | INTRAVENOUS | Status: DC | PRN

## 2022-10-23 MED ORDER — IOHEXOL 300 MG/ML  SOLN
100.0000 mL | Freq: Once | INTRAMUSCULAR | Status: AC | PRN
  Administered 2022-10-23: 100 mL via INTRAVENOUS

## 2022-10-23 MED ORDER — ACETAMINOPHEN 325 MG PO TABS: 650.0000 mg | ORAL_TABLET | ORAL | Status: DC | PRN

## 2022-10-23 MED ORDER — SODIUM CHLORIDE 0.9 % IV SOLN: INTRAVENOUS | Status: DC

## 2022-10-23 MED ORDER — ONDANSETRON HCL 4 MG/2ML IJ SOLN: 4.0000 mg | Freq: Four times a day (QID) | INTRAMUSCULAR | Status: DC | PRN

## 2022-10-23 MED ORDER — HYDROMORPHONE HCL 1 MG/ML IJ SOLN
1.0000 mg | Freq: Once | INTRAMUSCULAR | Status: AC
  Administered 2022-10-23: 1 mg via INTRAVENOUS
  Filled 2022-10-23: qty 1

## 2022-10-23 MED ORDER — OXYCODONE HCL 5 MG PO TABS: 5.0000 mg | ORAL_TABLET | ORAL | Status: DC | PRN

## 2022-10-23 MED ORDER — SODIUM CHLORIDE 0.9 % IV BOLUS
1000.0000 mL | Freq: Once | INTRAVENOUS | Status: AC
  Administered 2022-10-23: 1000 mL via INTRAVENOUS

## 2022-10-23 MED ORDER — PHYTONADIONE 5 MG PO TABS
5.0000 mg | ORAL_TABLET | Freq: Once | ORAL | Status: AC
  Administered 2022-10-23: 5 mg via ORAL
  Filled 2022-10-23: qty 1

## 2022-10-23 MED ORDER — SODIUM CHLORIDE (PF) 0.9 % IJ SOLN
INTRAMUSCULAR | Status: AC
  Filled 2022-10-23: qty 50

## 2022-10-23 NOTE — ED Notes (Signed)
Contacted [facility] for pt transport.
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
## 2022-10-23 NOTE — ED Notes (Signed)
Report given to [employer], Charity fundraiser.

## 2022-10-24 ENCOUNTER — Encounter (HOSPITAL_COMMUNITY)
Admission: RE | Admit: 2022-10-24 | Discharge: 2022-10-24 | Disposition: A | Discharge status: Home / Self Care | Payer: BC Managed Care – PPO | Source: Ambulatory Visit | Attending: Anesthesiology | Admitting: Anesthesiology

## 2022-10-24 DIAGNOSIS — K760 Fatty (change of) liver, not elsewhere classified: Secondary | ICD-10-CM

## 2022-10-24 DIAGNOSIS — Z7901 Long term (current) use of anticoagulants: Secondary | ICD-10-CM | POA: Diagnosis not present

## 2022-10-24 DIAGNOSIS — Z01818 Encounter for other preprocedural examination: Secondary | ICD-10-CM

## 2022-10-24 DIAGNOSIS — R7303 Prediabetes: Secondary | ICD-10-CM

## 2022-10-24 DIAGNOSIS — S37091A Other injury of right kidney, initial encounter: Secondary | ICD-10-CM | POA: Diagnosis not present

## 2022-10-24 LAB — COMPREHENSIVE METABOLIC PANEL
ALT: 23 U/L (ref 0–44)
AST: 19 U/L (ref 15–41)
Albumin: 3 g/dL — ABNORMAL LOW (ref 3.5–5.0)
Alkaline Phosphatase: 59 U/L (ref 38–126)
Anion gap: 10 (ref 5–15)
BUN: 15 mg/dL (ref 6–20)
CO2: 21 mmol/L — ABNORMAL LOW (ref 22–32)
Calcium: 8.1 mg/dL — ABNORMAL LOW (ref 8.9–10.3)
Chloride: 107 mmol/L (ref 98–111)
Creatinine, Ser: 0.98 mg/dL (ref 0.61–1.24)
GFR, Estimated: >60 mL/min (ref >60)
Glucose, Bld: 105 mg/dL — ABNORMAL HIGH (ref 70–99)
Potassium: 3.7 mmol/L (ref 3.5–5.1)
Sodium: 138 mmol/L (ref 135–145)
Total Bilirubin: 0.7 mg/dL (ref 0.3–1.2)
Total Protein: 5.6 g/dL — ABNORMAL LOW (ref 6.5–8.1)

## 2022-10-24 LAB — CBC
HCT: 30.9 % — ABNORMAL LOW (ref 39.0–52.0)
HCT: 30.6 % — ABNORMAL LOW (ref 39.0–52.0)
Hemoglobin: 10.1 g/dL — ABNORMAL LOW (ref 13.0–17.0)
Hemoglobin: 10.6 g/dL — ABNORMAL LOW (ref 13.0–17.0)
MCH: 31.7 pg (ref 26.0–34.0)
MCH: 32.3 pg (ref 26.0–34.0)
MCHC: 32.7 g/dL (ref 30.0–36.0)
MCHC: 34.6 g/dL (ref 30.0–36.0)
MCV: 96.9 fL (ref 80.0–100.0)
MCV: 93.3 fL (ref 80.0–100.0)
Platelets: 195 10*3/uL (ref 150–400)
Platelets: 203 10*3/uL (ref 150–400)
RBC: 3.19 MIL/uL — ABNORMAL LOW (ref 4.22–5.81)
RBC: 3.28 MIL/uL — ABNORMAL LOW (ref 4.22–5.81)
RDW: 13.3 % (ref 11.5–15.5)
RDW: 12.8 % (ref 11.5–15.5)
WBC: 11.1 10*3/uL — ABNORMAL HIGH (ref 4.0–10.5)
WBC: 11.6 10*3/uL — ABNORMAL HIGH (ref 4.0–10.5)
nRBC: 0 % (ref 0.0–0.2)
nRBC: 0 % (ref 0.0–0.2)

## 2022-10-24 LAB — PROTIME-INR
INR: 1.5 — ABNORMAL HIGH (ref 0.8–1.2)
Prothrombin Time: 18.2 seconds — ABNORMAL HIGH (ref 11.4–15.2)

## 2022-10-24 NOTE — Progress Notes (Signed)
   10/24/22 1235  Mobility  Activity Ambulated independently in hallway  Level of Assistance Independent  Assistive Device None  Distance Ambulated (ft) 550 ft  Activity Response Tolerated well  Mobility Referral Yes  $Mobility charge 1 Mobility   Mobility Specialist Progress Note  Received pt in chair having no complaints and agreeable to mobility. Pt was asymptomatic throughout ambulation and returned to room w/o fault. Left in chair w/ call bell in reach and all needs met.  Anastasia Pall Mobility Specialist  Please contact via SecureChat or Rehab office at [phone]

## 2022-10-24 NOTE — Plan of Care (Signed)
  Problem: Education: Goal: Knowledge of General Education information will improve Description Including pain rating scale, medication(s)/side effects and non-pharmacologic comfort measures Outcome: Progressing   Problem: Health Behavior/Discharge Planning: Goal: Ability to manage health-related needs will improve Outcome: Progressing   

## 2022-10-24 NOTE — Progress Notes (Signed)
Central tele called and pt HR 140s notified Bailey Mech PA and she said to monitor pt until tomorrow on telemetry.
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
## 2022-10-24 NOTE — Progress Notes (Signed)
Called patient regarding PAT appointment. He stated he is currently hospitalized at [facility] and is unsure when he will be discharged or if he will still be having his surgery. Updated scheduler.

## 2022-10-25 LAB — CBC
HCT: 30.4 % — ABNORMAL LOW (ref 39.0–52.0)
Hemoglobin: 10.8 g/dL — ABNORMAL LOW (ref 13.0–17.0)
MCH: 32.8 pg (ref 26.0–34.0)
MCHC: 35.5 g/dL (ref 30.0–36.0)
MCV: 92.4 fL (ref 80.0–100.0)
Platelets: 213 10*3/uL (ref 150–400)
RBC: 3.29 MIL/uL — ABNORMAL LOW (ref 4.22–5.81)
RDW: 12.7 % (ref 11.5–15.5)
WBC: 11.5 10*3/uL — ABNORMAL HIGH (ref 4.0–10.5)
nRBC: 0 % (ref 0.0–0.2)

## 2022-10-25 MED ORDER — ACETAMINOPHEN 325 MG PO TABS: 650.0000 mg | ORAL_TABLET | ORAL | Status: DC | PRN

## 2022-10-25 MED ORDER — OXYCODONE HCL 5 MG PO TABS: 5.0000 mg | ORAL_TABLET | ORAL | 0 refills | Status: DC | PRN
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
## 2022-10-25 NOTE — Progress Notes (Signed)
   10/25/22 1000  Mobility  Activity Ambulated independently in hallway  Level of Assistance Independent  Assistive Device None  Distance Ambulated (ft) 550 ft  Activity Response Tolerated well  Mobility Referral Yes  $Mobility charge 1 Mobility   Mobility Specialist Progress Note  Pt was in bed and agreeable. Had no c/o pain. Returned to bed w/ all needs met and call bell in reach.   [name] [name] Mobility Specialist  Please contact via SecureChat or Rehab office at [phone]  
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
## 2022-10-28 ENCOUNTER — Encounter: Payer: Self-pay | Admitting: Family Medicine

## 2022-10-28 ENCOUNTER — Ambulatory Visit (INDEPENDENT_AMBULATORY_CARE_PROVIDER_SITE_OTHER): Payer: BC Managed Care – PPO | Admitting: Family Medicine

## 2022-10-28 VITALS — BP 121/85 | HR 107 | Temp 98.0°F

## 2022-10-28 DIAGNOSIS — D6869 Other thrombophilia: Secondary | ICD-10-CM

## 2022-10-28 DIAGNOSIS — I81 Portal vein thrombosis: Secondary | ICD-10-CM

## 2022-10-28 DIAGNOSIS — S37019D Minor contusion of unspecified kidney, subsequent encounter: Secondary | ICD-10-CM | POA: Diagnosis not present

## 2022-10-28 DIAGNOSIS — D500 Iron deficiency anemia secondary to blood loss (chronic): Secondary | ICD-10-CM | POA: Diagnosis not present

## 2022-10-28 DIAGNOSIS — S37019A Minor contusion of unspecified kidney, initial encounter: Secondary | ICD-10-CM | POA: Insufficient documentation

## 2022-10-28 LAB — CBC
HCT: 34.4 % — ABNORMAL LOW (ref 39.0–52.0)
Hemoglobin: 11.7 g/dL — ABNORMAL LOW (ref 13.0–17.0)
MCHC: 33.9 g/dL (ref 30.0–36.0)
MCV: 94.8 fl (ref 78.0–100.0)
Platelets: 366 10*3/uL (ref 150.0–400.0)
RBC: 3.63 Mil/uL — ABNORMAL LOW (ref 4.22–5.81)
RDW: 13.9 % (ref 11.5–15.5)
WBC: 14.4 10*3/uL — ABNORMAL HIGH (ref 4.0–10.5)

## 2022-10-28 LAB — IBC + FERRITIN
Ferritin: 214.3 ng/mL (ref 22.0–322.0)
Iron: 11 ug/dL — ABNORMAL LOW (ref 42–165)
Saturation Ratios: 4.2 % — ABNORMAL LOW (ref 20.0–50.0)
TIBC: 261.8 ug/dL (ref 250.0–450.0)
Transferrin: 187 mg/dL — ABNORMAL LOW (ref 212.0–360.0)

## 2022-10-28 MED ORDER — POLYSACCHARIDE IRON COMPLEX 434.8 (200 FE) MG PO CAPS: 1.0000 | ORAL_CAPSULE | Freq: Every day | ORAL | 0 refills | Status: DC

## 2022-10-28 MED ORDER — SENNA-DOCUSATE SODIUM 8.6-50 MG PO TABS: 1.0000 | ORAL_TABLET | Freq: Every evening | ORAL | 2 refills | Status: DC

## 2022-10-28 NOTE — Telephone Encounter (Signed)
Pt returned call and reports he was advised at d/c to stop lovenox and warfarin. He is going to discuss alternate anticoagulants with PCP today at hospital f/u OV.  Advised if anything is needed to contact coumadin clinic. Pt verbalized understanding.
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
## 2022-10-31 ENCOUNTER — Telehealth: Payer: Self-pay | Admitting: Family Medicine

## 2022-10-31 NOTE — Telephone Encounter (Signed)
Spoke with patient regarding results/recommendations.  

## 2022-10-31 NOTE — Telephone Encounter (Signed)
Please call patient Hemoglobin is stable from hospital discharge. White cell counts are mildly elevated.  This is most likely from the hematoma and inflammatory process.  However if develops any fevers or chills, then we would asked to see him again so that we can evaluate further.  His iron levels are significantly low at 11.  I encouraged him to take the iron supplement as we discussed along with a stool softener.  I had referred him to hematology to discuss his chronic anticoagulation, and I would ask him to bring up the decreased iron levels.  He may be a candidate for an iron transfusion which is completed through hematology.

## 2022-11-01 ENCOUNTER — Inpatient Hospital Stay: Payer: BC Managed Care – PPO | Attending: Hematology & Oncology

## 2022-11-01 ENCOUNTER — Other Ambulatory Visit: Payer: Self-pay

## 2022-11-01 ENCOUNTER — Encounter: Payer: Self-pay | Admitting: Family

## 2022-11-01 ENCOUNTER — Other Ambulatory Visit: Payer: Self-pay | Admitting: Family

## 2022-11-01 ENCOUNTER — Inpatient Hospital Stay (HOSPITAL_BASED_OUTPATIENT_CLINIC_OR_DEPARTMENT_OTHER): Payer: BC Managed Care – PPO | Admitting: Family

## 2022-11-01 VITALS — BP 106/74 | HR 93 | Temp 98.1°F | Resp 18 | Ht 73.0 in | Wt 244.0 lb

## 2022-11-01 DIAGNOSIS — S37019A Minor contusion of unspecified kidney, initial encounter: Secondary | ICD-10-CM

## 2022-11-01 DIAGNOSIS — Z832 Family history of diseases of the blood and blood-forming organs and certain disorders involving the immune mechanism: Secondary | ICD-10-CM | POA: Insufficient documentation

## 2022-11-01 DIAGNOSIS — I81 Portal vein thrombosis: Secondary | ICD-10-CM

## 2022-11-01 DIAGNOSIS — Y9355 Activity, bike riding: Secondary | ICD-10-CM | POA: Diagnosis not present

## 2022-11-01 DIAGNOSIS — D509 Iron deficiency anemia, unspecified: Secondary | ICD-10-CM | POA: Insufficient documentation

## 2022-11-01 DIAGNOSIS — D6859 Other primary thrombophilia: Secondary | ICD-10-CM | POA: Insufficient documentation

## 2022-11-01 DIAGNOSIS — D508 Other iron deficiency anemias: Secondary | ICD-10-CM

## 2022-11-01 LAB — CBC WITH DIFFERENTIAL (CANCER CENTER ONLY)
Abs Immature Granulocytes: 0.07 10*3/uL (ref 0.00–0.07)
Basophils Absolute: 0.1 10*3/uL (ref 0.0–0.1)
Basophils Relative: 0 %
Eosinophils Absolute: 0.1 10*3/uL (ref 0.0–0.5)
Eosinophils Relative: 1 %
HCT: 33.7 % — ABNORMAL LOW (ref 39.0–52.0)
Hemoglobin: 11.4 g/dL — ABNORMAL LOW (ref 13.0–17.0)
Immature Granulocytes: 1 %
Lymphocytes Relative: 18 %
Lymphs Abs: 2 10*3/uL (ref 0.7–4.0)
MCH: 31.4 pg (ref 26.0–34.0)
MCHC: 33.8 g/dL (ref 30.0–36.0)
MCV: 92.8 fL (ref 80.0–100.0)
Monocytes Absolute: 1.1 10*3/uL — ABNORMAL HIGH (ref 0.1–1.0)
Monocytes Relative: 9 %
Neutro Abs: 8.2 10*3/uL — ABNORMAL HIGH (ref 1.7–7.7)
Neutrophils Relative %: 71 %
Platelet Count: 439 10*3/uL — ABNORMAL HIGH (ref 150–400)
RBC: 3.63 MIL/uL — ABNORMAL LOW (ref 4.22–5.81)
RDW: 12.8 % (ref 11.5–15.5)
WBC Count: 11.5 10*3/uL — ABNORMAL HIGH (ref 4.0–10.5)
nRBC: 0 % (ref 0.0–0.2)

## 2022-11-01 LAB — CMP (CANCER CENTER ONLY)
ALT: 14 U/L (ref 0–44)
AST: 16 U/L (ref 15–41)
Albumin: 4.2 g/dL (ref 3.5–5.0)
Alkaline Phosphatase: 72 U/L (ref 38–126)
Anion gap: 7 (ref 5–15)
BUN: 13 mg/dL (ref 6–20)
CO2: 27 mmol/L (ref 22–32)
Calcium: 9.4 mg/dL (ref 8.9–10.3)
Chloride: 101 mmol/L (ref 98–111)
Creatinine: 1.07 mg/dL (ref 0.61–1.24)
GFR, Estimated: >60 mL/min (ref >60)
Glucose, Bld: 102 mg/dL — ABNORMAL HIGH (ref 70–99)
Potassium: 4 mmol/L (ref 3.5–5.1)
Sodium: 135 mmol/L (ref 135–145)
Total Bilirubin: 1 mg/dL (ref 0.3–1.2)
Total Protein: 8 g/dL (ref 6.5–8.1)

## 2022-11-01 LAB — D-DIMER, QUANTITATIVE: D-Dimer, Quant: 3.27 ug/mL-FEU — ABNORMAL HIGH (ref 0.00–0.50)

## 2022-11-01 LAB — LACTATE DEHYDROGENASE: LDH: 275 U/L — ABNORMAL HIGH (ref 98–192)

## 2022-11-01 LAB — ANTITHROMBIN III: AntiThromb III Func: 106 % (ref 75–120)
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
## 2022-11-02 LAB — LUPUS ANTICOAGULANT PANEL
DRVVT: 38.6 s (ref 0.0–47.0)
PTT Lupus Anticoagulant: 38.6 s (ref 0.0–43.5)

## 2022-11-02 LAB — PROTEIN S, TOTAL: Protein S Ag, Total: 102 % (ref 60–150)

## 2022-11-02 LAB — PROTEIN S ACTIVITY: Protein S Activity: 97 % (ref 63–140)

## 2022-11-02 LAB — PROTEIN C ACTIVITY: Protein C Activity: 123 % (ref 73–180)

## 2022-11-02 LAB — HOMOCYSTEINE: Homocysteine: 9.9 umol/L (ref 0.0–14.5)

## 2022-11-03 LAB — CARDIOLIPIN ANTIBODIES, IGG, IGM, IGA
Anticardiolipin IgA: 9 APL U/mL (ref 0–11)
Anticardiolipin IgG: <9 GPL U/mL (ref 0–14)
Anticardiolipin IgM: <9 MPL U/mL (ref 0–12)

## 2022-11-03 LAB — BETA-2-GLYCOPROTEIN I ABS, IGG/M/A
Beta-2 Glyco I IgG: <9 GPI IgG units (ref 0–20)
Beta-2-Glycoprotein I IgA: <9 GPI IgA units (ref 0–25)
Beta-2-Glycoprotein I IgM: <9 GPI IgM units (ref 0–32)

## 2022-11-03 LAB — PROTEIN C, TOTAL: Protein C, Total: 107 % (ref 60–150)

## 2022-11-09 ENCOUNTER — Telehealth: Payer: Self-pay

## 2022-11-09 ENCOUNTER — Ambulatory Visit: Payer: BC Managed Care – PPO

## 2022-11-09 NOTE — Telephone Encounter (Signed)
Pt called to cancel coumadin clinic apt because he has not been taking warfarin per oncology. Oncology has completed several lab tests and an MRI is scheduled for tomorrow. Pt is working closely with oncology for next step recommendations concerning warfarin.  Advised pt to keep coumadin clinic up to date with oncology assessment and recommendations. Pt verbalized understanding.

## 2022-11-10 ENCOUNTER — Ambulatory Visit (HOSPITAL_BASED_OUTPATIENT_CLINIC_OR_DEPARTMENT_OTHER)
Admission: RE | Admit: 2022-11-10 | Discharge: 2022-11-10 | Disposition: A | Discharge status: Home / Self Care | Payer: BC Managed Care – PPO | Source: Ambulatory Visit | Attending: Family | Admitting: Family

## 2022-11-10 DIAGNOSIS — I81 Portal vein thrombosis: Secondary | ICD-10-CM | POA: Diagnosis present

## 2022-11-10 MED ORDER — GADOBUTROL 1 MMOL/ML IV SOLN
10.0000 mL | Freq: Once | INTRAVENOUS | Status: AC | PRN
  Administered 2022-11-10: 10 mL via INTRAVENOUS
  Filled 2022-11-10: qty 10

## 2022-11-14 ENCOUNTER — Other Ambulatory Visit: Payer: Self-pay | Admitting: Family

## 2022-11-14 ENCOUNTER — Telehealth: Payer: Self-pay | Admitting: Family

## 2022-11-14 DIAGNOSIS — I81 Portal vein thrombosis: Secondary | ICD-10-CM

## 2022-11-14 DIAGNOSIS — S37011A Minor contusion of right kidney, initial encounter: Secondary | ICD-10-CM

## 2022-11-14 DIAGNOSIS — D6859 Other primary thrombophilia: Secondary | ICD-10-CM

## 2022-11-14 LAB — PROTHROMBIN GENE MUTATION

## 2022-11-14 NOTE — Telephone Encounter (Signed)
I spoke with the [name] [name] and discussed his MRI in detail. Dr. Myna Hidalgo also consulted and we would like him to see urology as well. [name] [name] is in agreement with this plan. Referral placed and scheduling message sent.  [name] [name] will continue to hold his Coumadin for now. Factor V Leiden pending.  He is scheduled for Hernia repair on 12/07/2022.  He will follow up later this week on Thursday while he is here for IV iron regarding whether or not he would like to stay out of work or would like to return on light duty next week on Monday.  [name] [name] appreciative of call.

## 2022-11-16 ENCOUNTER — Telehealth: Payer: Self-pay | Admitting: *Deleted

## 2022-11-16 LAB — FACTOR 5 LEIDEN

## 2022-11-16 NOTE — Telephone Encounter (Signed)
Referral was sent over to Dr. Pete Glatter

## 2022-11-17 ENCOUNTER — Inpatient Hospital Stay: Payer: BC Managed Care – PPO | Attending: Hematology & Oncology

## 2022-11-17 ENCOUNTER — Ambulatory Visit (INDEPENDENT_AMBULATORY_CARE_PROVIDER_SITE_OTHER): Payer: BC Managed Care – PPO | Admitting: Urology

## 2022-11-17 ENCOUNTER — Encounter: Payer: Self-pay | Admitting: Urology

## 2022-11-17 VITALS — BP 128/85 | HR 72 | Temp 98.2°F | Resp 17

## 2022-11-17 VITALS — BP 116/80 | HR 90 | Ht 73.0 in | Wt 245.0 lb

## 2022-11-17 DIAGNOSIS — S37019A Minor contusion of unspecified kidney, initial encounter: Secondary | ICD-10-CM

## 2022-11-17 DIAGNOSIS — Z8349 Family history of other endocrine, nutritional and metabolic diseases: Secondary | ICD-10-CM | POA: Insufficient documentation

## 2022-11-17 DIAGNOSIS — Z8379 Family history of other diseases of the digestive system: Secondary | ICD-10-CM | POA: Diagnosis not present

## 2022-11-17 DIAGNOSIS — I81 Portal vein thrombosis: Secondary | ICD-10-CM | POA: Insufficient documentation

## 2022-11-17 DIAGNOSIS — Z79899 Other long term (current) drug therapy: Secondary | ICD-10-CM | POA: Diagnosis not present

## 2022-11-17 DIAGNOSIS — Z833 Family history of diabetes mellitus: Secondary | ICD-10-CM | POA: Insufficient documentation

## 2022-11-17 DIAGNOSIS — Z818 Family history of other mental and behavioral disorders: Secondary | ICD-10-CM | POA: Diagnosis not present

## 2022-11-17 DIAGNOSIS — Z87440 Personal history of urinary (tract) infections: Secondary | ICD-10-CM

## 2022-11-17 DIAGNOSIS — D508 Other iron deficiency anemias: Secondary | ICD-10-CM

## 2022-11-17 DIAGNOSIS — Z8249 Family history of ischemic heart disease and other diseases of the circulatory system: Secondary | ICD-10-CM | POA: Diagnosis not present

## 2022-11-17 DIAGNOSIS — D509 Iron deficiency anemia, unspecified: Secondary | ICD-10-CM | POA: Diagnosis present

## 2022-11-17 DIAGNOSIS — Z823 Family history of stroke: Secondary | ICD-10-CM | POA: Diagnosis not present

## 2022-11-17 MED ORDER — SODIUM CHLORIDE 0.9 % IV SOLN: Freq: Once | INTRAVENOUS | Status: AC

## 2022-11-17 MED ORDER — SODIUM CHLORIDE 0.9 % IV SOLN
300.0000 mg | Freq: Once | INTRAVENOUS | Status: AC
  Administered 2022-11-17: 300 mg via INTRAVENOUS
  Filled 2022-11-17: qty 300
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
## 2022-11-17 NOTE — Patient Instructions (Signed)

## 2022-11-18 LAB — URINALYSIS, ROUTINE W REFLEX MICROSCOPIC
Bilirubin, UA: NEGATIVE
Glucose, UA: NEGATIVE
Ketones, UA: NEGATIVE
Leukocytes,UA: NEGATIVE
Nitrite, UA: NEGATIVE
Protein,UA: NEGATIVE
Specific Gravity, UA: 1.02 (ref 1.005–1.030)
Urobilinogen, Ur: 0.2 mg/dL (ref 0.2–1.0)
pH, UA: 6.5 (ref 5.0–7.5)

## 2022-11-18 LAB — MICROSCOPIC EXAMINATION
Cast Type: NONE SEEN
Casts: NONE SEEN /lpf
Crystal Type: NONE SEEN
Crystals: NONE SEEN
Epithelial Cells (non renal): NONE SEEN /hpf (ref 0–10)
Mucus, UA: NONE SEEN
RBC, Urine: NONE SEEN /hpf (ref 0–2)
Renal Epithel, UA: NONE SEEN /hpf
Trichomonas, UA: NONE SEEN
WBC, UA: NONE SEEN /hpf (ref 0–5)
Yeast, UA: NONE SEEN

## 2022-11-21 ENCOUNTER — Telehealth: Payer: Self-pay

## 2022-11-21 NOTE — Patient Instructions (Signed)
SURGICAL WAITING ROOM VISITATION  Patients having surgery or a procedure may have no more than 2 support people in the waiting area - these visitors may rotate.    Children under the age of 59 must have an adult with them who is not the patient.  Due to an increase in RSV and influenza rates and associated hospitalizations, children ages 71 and under may not visit patients in [facility] hospitals.  If the patient needs to stay at the hospital during part of their recovery, the visitor guidelines for inpatient rooms apply. Pre-op nurse will coordinate an appropriate time for 1 support person to accompany patient in pre-op.  This support person may not rotate.    Please refer to the [facility] website for the visitor guidelines for Inpatients (after your surgery is over and you are in a regular room).       Your procedure is scheduled on: 12/06/22    Report to [facility] Main Entrance    Report to admitting at   0800AM   Call this number if you have problems the morning of surgery [phone]   Do not eat food :After Midnight.   After Midnight you may have the following liquids until __  0700____ AM DAY OF SURGERY  Water Non-Citrus Juices (without pulp, NO RED-Apple, White grape, White cranberry) Black Coffee (NO MILK/CREAM OR CREAMERS, sugar ok)  Clear Tea (NO MILK/CREAM OR CREAMERS, sugar ok) regular and decaf                             Plain Jell-O (NO RED)                                           Fruit ices (not with fruit pulp, NO RED)                                     Popsicles (NO RED)                                                               Sports drinks like Gatorade (NO RED)                    The day of surgery:  Drink ONE (1) Pre-Surgery Clear Ensure or G2 at  0700 AM (  have completed by )  the morning of surgery. Drink in one sitting. Do not sip.  This drink was given to you during your hospital  pre-op appointment visit. Nothing else to  drink after completing the  Pre-Surgery Clear Ensure or G2.          If you have questions, please contact your surgeon's office.        Oral Hygiene is also important to reduce your risk of infection.                                    Remember - BRUSH YOUR TEETH THE MORNING OF  SURGERY WITH YOUR REGULAR TOOTHPASTE  DENTURES WILL BE REMOVED PRIOR TO SURGERY PLEASE DO NOT APPLY "Poly grip" OR ADHESIVES!!!   Do NOT smoke after Midnight   Take these medicines the morning of surgery with A SIP OF WATER:  zyrtec   DO NOT TAKE ANY ORAL DIABETIC MEDICATIONS DAY OF YOUR SURGERY  Bring CPAP mask and tubing day of surgery.                              You may not have any metal on your body including hair pins, jewelry, and body piercing             Do not wear make-up, lotions, powders, perfumes/cologne, or deodorant  Do not wear nail polish including gel and S&S, artificial/acrylic nails, or any other type of covering on natural nails including finger and toenails. If you have artificial nails, gel coating, etc. that needs to be removed by a nail salon please have this removed prior to surgery or surgery may need to be canceled/ delayed if the surgeon/ anesthesia feels like they are unable to be safely monitored.   Do not shave  48 hours prior to surgery.               Men may shave face and neck.   Do not bring valuables to the hospital. [facility] IS NOT             RESPONSIBLE   FOR VALUABLES.   Contacts, glasses, dentures or bridgework may not be worn into surgery.   Bring small overnight bag day of surgery.   DO NOT BRING YOUR HOME MEDICATIONS TO THE HOSPITAL. PHARMACY WILL DISPENSE MEDICATIONS LISTED ON YOUR MEDICATION LIST TO YOU DURING YOUR ADMISSION IN THE HOSPITAL!    Patients discharged on the day of surgery will not be allowed to drive home.  Someone NEEDS to stay with you for the first 24 hours after anesthesia.   Special Instructions: Bring a copy of your healthcare  power of attorney and living will documents the day of surgery if you haven't scanned them before.              Please read over the following fact sheets you were given: IF YOU HAVE QUESTIONS ABOUT YOUR PRE-OP INSTRUCTIONS PLEASE CALL [phone]   If you received a COVID test during your pre-op visit  it is requested that you wear a mask when out in public, stay away from anyone that may not be feeling well and notify your surgeon if you develop symptoms. If you test positive for Covid or have been in contact with anyone that has tested positive in the last 10 days please notify you surgeon.    [name] - Preparing for Surgery Before surgery, you can play an important role.  Because skin is not sterile, your skin needs to be as free of germs as possible.  You can reduce the number of germs on your skin by washing with CHG (chlorahexidine gluconate) soap before surgery.  CHG is an antiseptic cleaner which kills germs and bonds with the skin to continue killing germs even after washing. Please DO NOT use if you have an allergy to CHG or antibacterial soaps.  If your skin becomes reddened/irritated stop using the CHG and inform your nurse when you arrive at Short Stay. Do not shave (including legs and underarms) for at least 48 hours prior to the first  CHG shower.  You may shave your face/neck. Please follow these instructions carefully:  1.  Shower with CHG Soap the night before surgery and the  morning of Surgery.  2.  If you choose to wash your hair, wash your hair first as usual with your  normal  shampoo.  3.  After you shampoo, rinse your hair and body thoroughly to remove the  shampoo.                           4.  Use CHG as you would any other liquid soap.  You can apply chg directly  to the skin and wash                       Gently with a scrungie or clean washcloth.  5.  Apply the CHG Soap to your body ONLY FROM THE NECK DOWN.   Do not use on face/ open                           Wound  or open sores. Avoid contact with eyes, ears mouth and genitals (private parts).                       Wash face,  Genitals (private parts) with your normal soap.             6.  Wash thoroughly, paying special attention to the area where your surgery  will be performed.  7.  Thoroughly rinse your body with warm water from the neck down.  8.  DO NOT shower/wash with your normal soap after using and rinsing off  the CHG Soap.                9.  Pat yourself dry with a clean towel.            10.  Wear clean pajamas.            11.  Place clean sheets on your bed the night of your first shower and do not  sleep with pets. Day of Surgery : Do not apply any lotions/deodorants the morning of surgery.  Please wear clean clothes to the hospital/surgery center.  FAILURE TO FOLLOW THESE INSTRUCTIONS MAY RESULT IN THE CANCELLATION OF YOUR SURGERY PATIENT SIGNATURE_________________________________  NURSE SIGNATURE__________________________________  ________________________________________________________________________

## 2022-11-21 NOTE — Progress Notes (Signed)
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  Just taked off Coumadin approx month ago after blood tests, mri performed by Brandon Melnick.   Pt currently receiving iron transfusion - 1-11/17/22, 2- 11/24/22, 3rd- 12/01/22 for low iron per pt .  Pt came to appt with 39 year old son.

## 2022-11-21 NOTE — Telephone Encounter (Signed)
-----   Message from Erenest Blank, NP sent at 11/21/2022 12:33 PM EDT ----- Hyper coag panel was negative!!!   ----- Message ----- From: Leory Plowman, Lab In [place] Sent: 11/01/2022   2:41 PM EDT To: Erenest Blank, NP

## 2022-11-23 ENCOUNTER — Other Ambulatory Visit: Payer: Self-pay

## 2022-11-23 ENCOUNTER — Telehealth: Payer: Self-pay

## 2022-11-23 ENCOUNTER — Encounter (HOSPITAL_COMMUNITY): Payer: Self-pay

## 2022-11-23 ENCOUNTER — Encounter (HOSPITAL_COMMUNITY)
Admission: RE | Admit: 2022-11-23 | Discharge: 2022-11-23 | Disposition: A | Discharge status: Home / Self Care | Payer: BC Managed Care – PPO | Source: Ambulatory Visit | Attending: Surgery | Admitting: Surgery

## 2022-11-23 DIAGNOSIS — Z01812 Encounter for preprocedural laboratory examination: Secondary | ICD-10-CM | POA: Diagnosis present

## 2022-11-23 DIAGNOSIS — Z01818 Encounter for other preprocedural examination: Secondary | ICD-10-CM

## 2022-11-23 DIAGNOSIS — K760 Fatty (change of) liver, not elsewhere classified: Secondary | ICD-10-CM | POA: Diagnosis not present

## 2022-11-23 DIAGNOSIS — R7303 Prediabetes: Secondary | ICD-10-CM | POA: Insufficient documentation

## 2022-11-23 HISTORY — DX: Personal history of urinary calculi: Z87.442

## 2022-11-23 HISTORY — DX: Anemia, unspecified: D64.9

## 2022-11-23 LAB — COMPREHENSIVE METABOLIC PANEL
ALT: 18 U/L (ref 0–44)
AST: 18 U/L (ref 15–41)
Albumin: 3.8 g/dL (ref 3.5–5.0)
Alkaline Phosphatase: 75 U/L (ref 38–126)
Anion gap: 9 (ref 5–15)
BUN: 15 mg/dL (ref 6–20)
CO2: 23 mmol/L (ref 22–32)
Calcium: 8.9 mg/dL (ref 8.9–10.3)
Chloride: 104 mmol/L (ref 98–111)
Creatinine, Ser: 1 mg/dL (ref 0.61–1.24)
GFR, Estimated: >60 mL/min (ref >60)
Glucose, Bld: 105 mg/dL — ABNORMAL HIGH (ref 70–99)
Potassium: 4.3 mmol/L (ref 3.5–5.1)
Sodium: 136 mmol/L (ref 135–145)
Total Bilirubin: 0.8 mg/dL (ref 0.3–1.2)
Total Protein: 7.8 g/dL (ref 6.5–8.1)

## 2022-11-23 LAB — CBC
HCT: 40.8 % (ref 39.0–52.0)
Hemoglobin: 13.1 g/dL (ref 13.0–17.0)
MCH: 30.9 pg (ref 26.0–34.0)
MCHC: 32.1 g/dL (ref 30.0–36.0)
MCV: 96.2 fL (ref 80.0–100.0)
Platelets: 309 10*3/uL (ref 150–400)
RBC: 4.24 MIL/uL (ref 4.22–5.81)
RDW: 14.5 % (ref 11.5–15.5)
WBC: 9.1 10*3/uL (ref 4.0–10.5)
nRBC: 0 % (ref 0.0–0.2)

## 2022-11-23 NOTE — Telephone Encounter (Signed)
Patient called asking for [place] to send last office visit note to [employer] for his FMLA. Informed patient his FMLA was completed at [employer] and it appears they faxed additional information over on May 10th. Patient states they are requesting more infromation, it sounds like they needed copy of MRI and since sarah carter ordered it he wasn't sure if she needed to send it. Called [facility] and talked with Shawna Orleans who was working on his [employer]. Faxed over MRI that melanie will send to [employer] as well for additional information.

## 2022-11-24 ENCOUNTER — Ambulatory Visit: Payer: Self-pay

## 2022-11-24 ENCOUNTER — Inpatient Hospital Stay: Payer: BC Managed Care – PPO

## 2022-11-24 VITALS — BP 129/91 | HR 77 | Temp 98.1°F | Resp 16

## 2022-11-24 DIAGNOSIS — D509 Iron deficiency anemia, unspecified: Secondary | ICD-10-CM | POA: Diagnosis not present

## 2022-11-24 DIAGNOSIS — D508 Other iron deficiency anemias: Secondary | ICD-10-CM

## 2022-11-24 MED ORDER — SODIUM CHLORIDE 0.9 % IV SOLN: Freq: Once | INTRAVENOUS | Status: AC

## 2022-11-24 MED ORDER — SODIUM CHLORIDE 0.9 % IV SOLN
300.0000 mg | Freq: Once | INTRAVENOUS | Status: AC
  Administered 2022-11-24: 300 mg via INTRAVENOUS
  Filled 2022-11-24: qty 300

## 2022-11-24 NOTE — Telephone Encounter (Signed)
Per oncology,  ----- Message from Erenest Blank, NP sent at 11/21/2022 12:33 PM EDT ----- Hyper coag panel was negative!!!     ----- Message ----- From: Leory Plowman, Lab In [place] Sent: 11/01/2022   2:41 PM EDT To: Erenest Blank, NP      Pt is no longer taking warfarin. Oncology has d/c warfarin.   Resolved anticoagulation episodes.

## 2022-11-24 NOTE — Progress Notes (Signed)
Per oncology,  ----- Message from Sarah M Carter, NP sent at 11/21/2022 12:33 PM EDT ----- Hyper coag panel was negative!!!     ----- Message ----- From: Interface, Lab In [place] Sent: 11/01/2022   2:41 PM EDT To: Sarah M Carter, NP      Pt is no longer taking warfarin. Oncology has d/c warfarin.   Resolved anticoagulation episodes.  

## 2022-11-24 NOTE — Patient Instructions (Signed)

## 2022-12-01 ENCOUNTER — Inpatient Hospital Stay: Payer: BC Managed Care – PPO

## 2022-12-01 VITALS — BP 121/80 | HR 60 | Temp 97.9°F | Resp 17

## 2022-12-01 DIAGNOSIS — D508 Other iron deficiency anemias: Secondary | ICD-10-CM

## 2022-12-01 DIAGNOSIS — D509 Iron deficiency anemia, unspecified: Secondary | ICD-10-CM | POA: Diagnosis not present

## 2022-12-01 MED ORDER — SODIUM CHLORIDE 0.9 % IV SOLN: Freq: Once | INTRAVENOUS | Status: AC

## 2022-12-01 MED ORDER — SODIUM CHLORIDE 0.9 % IV SOLN
300.0000 mg | Freq: Once | INTRAVENOUS | Status: AC
  Administered 2022-12-01: 300 mg via INTRAVENOUS
  Filled 2022-12-01: qty 300

## 2022-12-01 NOTE — Patient Instructions (Signed)

## 2022-12-01 NOTE — Progress Notes (Signed)
Patient does not want to stay for the 30 minute post IV iron observation. VSS. Patient discharged ambulatory without complaints or concerns.  

## 2022-12-06 ENCOUNTER — Other Ambulatory Visit: Payer: Self-pay

## 2022-12-06 ENCOUNTER — Ambulatory Visit (HOSPITAL_COMMUNITY): Payer: BC Managed Care – PPO | Admitting: Physician Assistant

## 2022-12-06 ENCOUNTER — Ambulatory Visit (HOSPITAL_COMMUNITY)
Admission: RE | Admit: 2022-12-06 | Discharge: 2022-12-06 | Disposition: A | Discharge status: Home / Self Care | Payer: BC Managed Care – PPO | Source: Ambulatory Visit | Attending: Surgery | Admitting: Surgery

## 2022-12-06 ENCOUNTER — Encounter (HOSPITAL_COMMUNITY): Payer: Self-pay | Admitting: Surgery

## 2022-12-06 ENCOUNTER — Ambulatory Visit (HOSPITAL_COMMUNITY): Payer: BC Managed Care – PPO | Admitting: Anesthesiology

## 2022-12-06 ENCOUNTER — Encounter (HOSPITAL_COMMUNITY)
Admission: RE | Disposition: A | Discharge status: Home / Self Care | Payer: Self-pay | Source: Ambulatory Visit | Attending: Surgery

## 2022-12-06 DIAGNOSIS — D649 Anemia, unspecified: Secondary | ICD-10-CM | POA: Diagnosis not present

## 2022-12-06 DIAGNOSIS — G709 Myoneural disorder, unspecified: Secondary | ICD-10-CM | POA: Diagnosis not present

## 2022-12-06 DIAGNOSIS — R7303 Prediabetes: Secondary | ICD-10-CM

## 2022-12-06 DIAGNOSIS — Z7901 Long term (current) use of anticoagulants: Secondary | ICD-10-CM

## 2022-12-06 DIAGNOSIS — K760 Fatty (change of) liver, not elsewhere classified: Secondary | ICD-10-CM | POA: Insufficient documentation

## 2022-12-06 DIAGNOSIS — E785 Hyperlipidemia, unspecified: Secondary | ICD-10-CM | POA: Insufficient documentation

## 2022-12-06 DIAGNOSIS — K4000 Bilateral inguinal hernia, with obstruction, without gangrene, not specified as recurrent: Secondary | ICD-10-CM | POA: Insufficient documentation

## 2022-12-06 DIAGNOSIS — Z86718 Personal history of other venous thrombosis and embolism: Secondary | ICD-10-CM | POA: Diagnosis not present

## 2022-12-06 HISTORY — PX: INGUINAL HERNIA REPAIR: SHX194

## 2022-12-06 HISTORY — PX: UMBILICAL HERNIA REPAIR: SHX196

## 2022-12-06 LAB — PROTIME-INR
INR: 1 (ref 0.8–1.2)
Prothrombin Time: 12.9 seconds (ref 11.4–15.2)

## 2022-12-06 LAB — APTT: aPTT: 29 seconds (ref 24–36)

## 2022-12-06 SURGERY — REPAIR, HERNIA, INGUINAL, BILATERAL, LAPAROSCOPIC
Anesthesia: General

## 2022-12-06 MED ORDER — CEFAZOLIN SODIUM-DEXTROSE 2-3 GM-%(50ML) IV SOLR
INTRAVENOUS | Status: DC | PRN
  Administered 2022-12-06: 2 g via INTRAVENOUS

## 2022-12-06 MED ORDER — ORAL CARE MOUTH RINSE: 15.0000 mL | Freq: Once | OROMUCOSAL | Status: AC

## 2022-12-06 MED ORDER — FENTANYL CITRATE (PF) 250 MCG/5ML IJ SOLN
INTRAMUSCULAR | Status: DC | PRN
  Administered 2022-12-06 (×2): 50 ug via INTRAVENOUS
  Administered 2022-12-06: 100 ug via INTRAVENOUS

## 2022-12-06 MED ORDER — MIDAZOLAM HCL 2 MG/2ML IJ SOLN
INTRAMUSCULAR | Status: AC
  Filled 2022-12-06: qty 2

## 2022-12-06 MED ORDER — FENTANYL CITRATE (PF) 250 MCG/5ML IJ SOLN
INTRAMUSCULAR | Status: AC
  Filled 2022-12-06: qty 5

## 2022-12-06 MED ORDER — BUPIVACAINE LIPOSOME 1.3 % IJ SUSP
INTRAMUSCULAR | Status: DC | PRN
  Administered 2022-12-06: 20 mL

## 2022-12-06 MED ORDER — OXYCODONE-ACETAMINOPHEN 5-325 MG PO TABS: 1.0000 | ORAL_TABLET | ORAL | 0 refills | Status: DC | PRN

## 2022-12-06 MED ORDER — PHENYLEPHRINE HCL-NACL 20-0.9 MG/250ML-% IV SOLN
INTRAVENOUS | Status: DC | PRN
  Administered 2022-12-06: 35 ug/min via INTRAVENOUS

## 2022-12-06 MED ORDER — GABAPENTIN 300 MG PO CAPS
ORAL_CAPSULE | ORAL | Status: AC
  Administered 2022-12-06: 300 mg via ORAL
  Filled 2022-12-06: qty 1

## 2022-12-06 MED ORDER — OXYCODONE HCL 5 MG/5ML PO SOLN: 5.0000 mg | Freq: Once | ORAL | Status: DC | PRN

## 2022-12-06 MED ORDER — KETOROLAC TROMETHAMINE 15 MG/ML IJ SOLN: 15.0000 mg | INTRAMUSCULAR | Status: AC

## 2022-12-06 MED ORDER — ONDANSETRON HCL 4 MG/2ML IJ SOLN
INTRAMUSCULAR | Status: AC
  Filled 2022-12-06: qty 2

## 2022-12-06 MED ORDER — ROCURONIUM BROMIDE 10 MG/ML (PF) SYRINGE
PREFILLED_SYRINGE | INTRAVENOUS | Status: DC | PRN
  Administered 2022-12-06 (×2): 20 mg via INTRAVENOUS
  Administered 2022-12-06: 50 mg via INTRAVENOUS

## 2022-12-06 MED ORDER — CHLORHEXIDINE GLUCONATE CLOTH 2 % EX PADS: 6.0000 | MEDICATED_PAD | Freq: Once | CUTANEOUS | Status: DC

## 2022-12-06 MED ORDER — ACETAMINOPHEN 500 MG PO TABS: 1000.0000 mg | ORAL_TABLET | ORAL | Status: AC

## 2022-12-06 MED ORDER — CEFAZOLIN SODIUM-DEXTROSE 2-4 GM/100ML-% IV SOLN
INTRAVENOUS | Status: AC
  Filled 2022-12-06: qty 100

## 2022-12-06 MED ORDER — OXYCODONE HCL 5 MG PO TABS: 5.0000 mg | ORAL_TABLET | Freq: Once | ORAL | Status: DC | PRN

## 2022-12-06 MED ORDER — PROPOFOL 10 MG/ML IV BOLUS
INTRAVENOUS | Status: DC | PRN
  Administered 2022-12-06: 150 mg via INTRAVENOUS

## 2022-12-06 MED ORDER — BUPIVACAINE LIPOSOME 1.3 % IJ SUSP: 20.0000 mL | Freq: Once | INTRAMUSCULAR | Status: DC

## 2022-12-06 MED ORDER — MIDAZOLAM HCL 5 MG/5ML IJ SOLN
INTRAMUSCULAR | Status: DC | PRN
  Administered 2022-12-06: 2 mg via INTRAVENOUS

## 2022-12-06 MED ORDER — LIDOCAINE 2% (20 MG/ML) 5 ML SYRINGE
INTRAMUSCULAR | Status: DC | PRN
  Administered 2022-12-06: 100 mg via INTRAVENOUS

## 2022-12-06 MED ORDER — DEXAMETHASONE SODIUM PHOSPHATE 10 MG/ML IJ SOLN
INTRAMUSCULAR | Status: AC
  Filled 2022-12-06: qty 1

## 2022-12-06 MED ORDER — ROCURONIUM BROMIDE 10 MG/ML (PF) SYRINGE
PREFILLED_SYRINGE | INTRAVENOUS | Status: AC
  Filled 2022-12-06: qty 10

## 2022-12-06 MED ORDER — BUPIVACAINE LIPOSOME 1.3 % IJ SUSP
INTRAMUSCULAR | Status: AC
  Filled 2022-12-06: qty 20

## 2022-12-06 MED ORDER — DEXAMETHASONE SODIUM PHOSPHATE 10 MG/ML IJ SOLN
INTRAMUSCULAR | Status: DC | PRN
  Administered 2022-12-06: 10 mg via INTRAVENOUS

## 2022-12-06 MED ORDER — LIDOCAINE HCL (PF) 2 % IJ SOLN
INTRAMUSCULAR | Status: AC
  Filled 2022-12-06: qty 5

## 2022-12-06 MED ORDER — KETOROLAC TROMETHAMINE 15 MG/ML IJ SOLN
INTRAMUSCULAR | Status: AC
  Administered 2022-12-06: 15 mg via INTRAVENOUS
  Filled 2022-12-06: qty 1

## 2022-12-06 MED ORDER — PHENYLEPHRINE 80 MCG/ML (10ML) SYRINGE FOR IV PUSH (FOR BLOOD PRESSURE SUPPORT)
PREFILLED_SYRINGE | INTRAVENOUS | Status: DC | PRN
  Administered 2022-12-06: 160 ug via INTRAVENOUS

## 2022-12-06 MED ORDER — ONDANSETRON HCL 4 MG/2ML IJ SOLN: INTRAMUSCULAR | Status: DC | PRN

## 2022-12-06 MED ORDER — ONDANSETRON HCL 4 MG/2ML IJ SOLN
INTRAMUSCULAR | Status: DC | PRN
  Administered 2022-12-06: 4 mg via INTRAVENOUS

## 2022-12-06 MED ORDER — PHENYLEPHRINE 80 MCG/ML (10ML) SYRINGE FOR IV PUSH (FOR BLOOD PRESSURE SUPPORT)
PREFILLED_SYRINGE | INTRAVENOUS | Status: AC
  Filled 2022-12-06: qty 10

## 2022-12-06 MED ORDER — 0.9 % SODIUM CHLORIDE (POUR BTL) OPTIME
TOPICAL | Status: DC | PRN
  Administered 2022-12-06: 1000 mL

## 2022-12-06 MED ORDER — BUPIVACAINE HCL 0.25 % IJ SOLN
INTRAMUSCULAR | Status: DC | PRN
  Administered 2022-12-06: 30 mL

## 2022-12-06 MED ORDER — LACTATED RINGERS IV SOLN: INTRAVENOUS | Status: DC

## 2022-12-06 MED ORDER — ACETAMINOPHEN 500 MG PO TABS: 1000.0000 mg | ORAL_TABLET | Freq: Once | ORAL | Status: DC

## 2022-12-06 MED ORDER — GABAPENTIN 300 MG PO CAPS: 300.0000 mg | ORAL_CAPSULE | ORAL | Status: AC

## 2022-12-06 MED ORDER — SUGAMMADEX SODIUM 200 MG/2ML IV SOLN
INTRAVENOUS | Status: DC | PRN
  Administered 2022-12-06: 200 mg via INTRAVENOUS

## 2022-12-06 MED ORDER — ACETAMINOPHEN 500 MG PO TABS
ORAL_TABLET | ORAL | Status: AC
  Administered 2022-12-06: 1000 mg via ORAL
  Filled 2022-12-06: qty 2

## 2022-12-06 MED ORDER — KETAMINE HCL 50 MG/5ML IJ SOSY
PREFILLED_SYRINGE | INTRAMUSCULAR | Status: AC
  Filled 2022-12-06: qty 5

## 2022-12-06 MED ORDER — KETAMINE HCL 10 MG/ML IJ SOLN
INTRAMUSCULAR | Status: DC | PRN
  Administered 2022-12-06: 20 mg via INTRAVENOUS
  Administered 2022-12-06: 10 mg via INTRAVENOUS
  Administered 2022-12-06: 20 mg via INTRAVENOUS

## 2022-12-06 MED ORDER — FENTANYL CITRATE PF 50 MCG/ML IJ SOSY
PREFILLED_SYRINGE | INTRAMUSCULAR | Status: AC
  Administered 2022-12-06: 50 ug via INTRAVENOUS
  Filled 2022-12-06: qty 1

## 2022-12-06 MED ORDER — LEVOFLOXACIN IN D5W 500 MG/100ML IV SOLN
500.0000 mg | Freq: Once | INTRAVENOUS | Status: DC
  Filled 2022-12-06: qty 100

## 2022-12-06 MED ORDER — AMISULPRIDE (ANTIEMETIC) 5 MG/2ML IV SOLN: 10.0000 mg | Freq: Once | INTRAVENOUS | Status: DC | PRN

## 2022-12-06 MED ORDER — FENTANYL CITRATE PF 50 MCG/ML IJ SOSY: 25.0000 ug | PREFILLED_SYRINGE | INTRAMUSCULAR | Status: DC | PRN

## 2022-12-06 MED ORDER — CEFAZOLIN SODIUM-DEXTROSE 2-4 GM/100ML-% IV SOLN: 2.0000 g | INTRAVENOUS | Status: DC

## 2022-12-06 MED ORDER — PROPOFOL 10 MG/ML IV BOLUS
INTRAVENOUS | Status: AC
  Filled 2022-12-06: qty 20

## 2022-12-06 MED ORDER — CHLORHEXIDINE GLUCONATE 0.12 % MT SOLN
15.0000 mL | Freq: Once | OROMUCOSAL | Status: AC
  Administered 2022-12-06: 15 mL via OROMUCOSAL

## 2022-12-06 MED ORDER — BUPIVACAINE HCL 0.25 % IJ SOLN
INTRAMUSCULAR | Status: AC
  Filled 2022-12-06: qty 1

## 2022-12-06 SURGICAL SUPPLY — 55 items
ADH SKN CLS APL DERMABOND .7 (GAUZE/BANDAGES/DRESSINGS) ×1
APL PRP STRL LF DISP 70% ISPRP (MISCELLANEOUS) ×1
BAG COUNTER SPONGE SURGICOUNT (BAG) ×1 IMPLANT
BAG SPNG CNTER NS LX DISP (BAG) ×1
BALL CTTN LRG ABS STRL LF (GAUZE/BANDAGES/DRESSINGS) ×1
CABLE HIGH FREQUENCY MONO STRZ (ELECTRODE) ×1 IMPLANT
CHLORAPREP W/TINT 26 (MISCELLANEOUS) ×1 IMPLANT
COTTONBALL LRG STERILE PKG (GAUZE/BANDAGES/DRESSINGS) ×1 IMPLANT
COVER SURGICAL LIGHT HANDLE (MISCELLANEOUS) ×1 IMPLANT
DERMABOND ADVANCED .7 DNX12 (GAUZE/BANDAGES/DRESSINGS) ×1 IMPLANT
DRAPE LAPAROTOMY T 98X78 PEDS (DRAPES) ×1 IMPLANT
DRSG TEGADERM 4X4.75 (GAUZE/BANDAGES/DRESSINGS) ×1 IMPLANT
ELECT REM PT RETURN 15FT ADLT (MISCELLANEOUS) ×1 IMPLANT
GLOVE BIO SURGEON STRL SZ7.5 (GLOVE) ×1 IMPLANT
GLOVE BIOGEL PI IND STRL 8 (GLOVE) ×1 IMPLANT
GLOVE INDICATOR 8.0 STRL GRN (GLOVE) ×1 IMPLANT
GOWN STRL REUS W/ TWL XL LVL3 (GOWN DISPOSABLE) ×2 IMPLANT
GOWN STRL REUS W/TWL XL LVL3 (GOWN DISPOSABLE) ×2
GRASPER SUT TROCAR 14GX15 (MISCELLANEOUS) ×1 IMPLANT
IRRIG SUCT STRYKERFLOW 2 WTIP (MISCELLANEOUS)
IRRIGATION SUCT STRKRFLW 2 WTP (MISCELLANEOUS) IMPLANT
KIT BASIN OR (CUSTOM PROCEDURE TRAY) ×1 IMPLANT
KIT TURNOVER KIT A (KITS) IMPLANT
MARKER SKIN DUAL TIP RULER LAB (MISCELLANEOUS) ×1 IMPLANT
MESH 3DMAX 5X7 LT XLRG (Mesh General) IMPLANT
MESH 3DMAX 5X7 RT XLRG (Mesh General) IMPLANT
NDL HYPO 22X1.5 SAFETY MO (MISCELLANEOUS) ×1 IMPLANT
NDL INSUFFLATION 14GA 120MM (NEEDLE) ×2 IMPLANT
NEEDLE HYPO 22X1.5 SAFETY MO (MISCELLANEOUS) ×1 IMPLANT
NEEDLE INSUFFLATION 14GA 120MM (NEEDLE) ×2 IMPLANT
NS IRRIG 1000ML POUR BTL (IV SOLUTION) ×1 IMPLANT
PACK GENERAL/GYN (CUSTOM PROCEDURE TRAY) ×1 IMPLANT
RELOAD STAPLE 4.0 BLU F/HERNIA (INSTRUMENTS) IMPLANT
RELOAD STAPLE 4.8 BLK F/HERNIA (STAPLE) ×1 IMPLANT
RELOAD STAPLE HERNIA 4.0 BLUE (INSTRUMENTS) ×1 IMPLANT
RELOAD STAPLE HERNIA 4.8 BLK (STAPLE) ×3 IMPLANT
SCISSORS LAP 5X35 DISP (ENDOMECHANICALS) ×1 IMPLANT
SET TUBE SMOKE EVAC HIGH FLOW (TUBING) ×1 IMPLANT
SPIKE FLUID TRANSFER (MISCELLANEOUS) ×1 IMPLANT
STAPLER HERNIA 12 8.5 360D (INSTRUMENTS) ×1 IMPLANT
SUT MNCRL AB 4-0 PS2 18 (SUTURE) ×1 IMPLANT
SUT NOVA NAB GS-21 0 18 T12 DT (SUTURE) IMPLANT
SUT PDS AB 2-0 CT2 27 (SUTURE) IMPLANT
SUT V-LOC BARB 180 2/0GR6 GS22 (SUTURE) ×1
SUT VIC AB 3-0 SH 8-18 (SUTURE) ×1 IMPLANT
SUT VICRYL 0 UR6 27IN ABS (SUTURE) IMPLANT
SUTURE V-LC BRB 180 2/0GR6GS22 (SUTURE) IMPLANT
SYR CONTROL 10ML LL (SYRINGE) ×1 IMPLANT
TOWEL OR 17X26 10 PK STRL BLUE (TOWEL DISPOSABLE) ×1 IMPLANT
TRAY FOL W/BAG SLVR 16FR STRL (SET/KITS/TRAYS/PACK) ×1 IMPLANT
TRAY FOLEY MTR SLVR 16FR STAT (SET/KITS/TRAYS/PACK) IMPLANT
TRAY FOLEY W/BAG SLVR 16FR LF (SET/KITS/TRAYS/PACK) ×1
TRAY LAPAROSCOPIC (CUSTOM PROCEDURE TRAY) ×1 IMPLANT
TROCAR ADV FIXATION 12X100MM (TROCAR) ×1 IMPLANT
TROCAR Z-THREAD OPTICAL 5X100M (TROCAR) ×2 IMPLANT

## 2022-12-06 NOTE — Discharge Instructions (Signed)
 GROIN HERNIA REPAIR POST OPERATIVE INSTRUCTIONS  Thinking Clearly  The anesthesia may cause you to feel different for 1 or 2 days. Do not drive, drink alcohol, or make any big decisions for at least 2 days.  Nutrition When you wake up, you will be able to drink small amounts of liquid. If you do not feel sick, you can slowly advance your diet to regular foods. Continue to drink lots of fluids, usually about 8 to 10 glasses per day. Eat a high-fiber diet so you don't strain during bowel movements. High-Fiber Foods Foods high in fiber include beans, bran cereals and whole-grain breads, peas, dried fruit (figs, apricots, and dates), raspberries, blackberries, strawberries, sweet corn, broccoli, baked potatoes with skin, plums, pears, apples, greens, and nuts. Activity Slowly increase your activity. Be sure to get up and walk every hour or so to prevent blood clots. No heavy lifting or strenuous activity for 4 weeks following surgery to prevent hernias at your incision sites or recurrence of your hernia. It is normal to feel tired. You may need more sleep than usual.  Get your rest but make sure to get up and move around frequently to prevent blood clots and pneumonia.  Work and Return to School You can go back to work when you feel well enough. Discuss the timing with your surgeon. You can usually go back to school or work 1 week or less after an laparoscopic or an open repair. If your work requires heavy lifting or strenuous activity you need to be placed on light duty for 4 weeks following surgery. You can return to gym class, sports or other physical activities 4 weeks after surgery.  Wound Care You may experience significant bruising in the groin including into the scrotum in males.  Rest, elevating the groin and scrotum above the level of the heart, ice and compression with tight fitting underwear can help.  Always wash your hands before and after touching near your incision site. Do  not soak in a bathtub until cleared at your follow up appointment. You may take a shower 24 hours after surgery. A small amount of drainage from the incision is normal. If the drainage is thick and yellow or the site is red, you may have an infection, so call your surgeon. If you have a drain in one of your incisions, it will be taken out in office when the drainage stops. Steri-Strips will fall off in 7 to 10 days or they will be removed during your first office visit. If you have dermabond glue covering over the incision, allow the glue to flake off on its own. Protect the new skin, especially from the sun. The sun can burn and cause darker scarring. Your scar will heal in about 4 to 6 weeks and will become softer and continue to fade over the next year.  The cosmetic appearance of the incisions will improve over the course of the first year after surgery. Sensation around your incision will return in a few weeks or months.  Bowel Movements After intestinal surgery, you may have loose watery stools for several days. If watery diarrhea lasts longer than 3 days, contact your surgeon. Pain medication (narcotics) can cause constipation. Increase the fiber in your diet with high-fiber foods if you are constipated. You can take an over the counter stool softener like Colace to avoid constipation.  Additional over the counter medications can also be used if Colace isn't sufficient (for example, Milk of Magnesia or Miralax).    Pain The amount of pain is different for each person. Some people need only 1 to 3 doses of pain control medication, while others need more. Take alternating doses of tylenol and ibuprofen around the clock for the first five days following surgery.  This will provide a baseline of pain control and help with inflammation.  Take the narcotic pain medication in addition if needed for severe pain.  Contact Your Surgeon at [phone], if you have: Pain that will not go away Pain that  gets worse A fever of more than 101F (38.3C) Repeated vomiting Swelling, redness, bleeding, or bad-smelling drainage from your wound site Strong abdominal pain No bowel movement or unable to pass gas for 3 days Watery diarrhea lasting longer than 3 days  Pain Control The goal of pain control is to minimize pain, keep you moving and help you heal. Your surgical team will work with you on your pain plan. Most often a combination of therapies and medications are used to control your pain. You may also be given medication (local anesthetic) at the surgical site. This may help control your pain for several days. Extreme pain puts extra stress on your body at a time when your body needs to focus on healing. Do not wait until your pain has reached a level "10" or is unbearable before telling your doctor or nurse. It is much easier to control pain before it becomes severe. Following a laparoscopic procedure, pain is sometimes felt in the shoulder. This is due to the gas inserted into your abdomen during the procedure. Moving and walking helps to decrease the gas and the right shoulder pain.  Use the guide below for ways to manage your post-operative pain. Learn more by going to facs.org/safepaincontrol.  How Intense Is My Pain Common Therapies to Feel Better       I hardly notice my pain, and it does not interfere with my activities.  I notice my pain and it distracts me, but I can still do activities (sitting up, walking, standing).  Non-Medication Therapies  Ice (in a bag, applied over clothing at the surgical site), elevation, rest, meditation, massage, distraction (music, TV, play) walking and mild exercise Splinting the abdomen with pillows +  Non-Opioid Medications Acetaminophen (Tylenol) Non-steroidal anti-inflammatory drugs (NSAIDS) Aspirin, Ibuprofen (Motrin, Advil) Naproxen (Aleve) Take these as needed, when you feel pain. Both acetaminophen and NSAIDs help to decrease pain  and swelling (inflammation).      My pain is hard to ignore and is more noticeable even when I rest.  My pain interferes with my usual activities.  Non-Medication Therapies  +  Non-Opioid medications  Take on a regular schedule (around-the-clock) instead of as needed. (For example, Tylenol every 6 hours at 9:00 am, 3:00 pm, 9:00 pm, 3:00 am and Motrin every 6 hours at 12:00 am, 6:00 am, 12:00 pm, 6:00 pm)         I am focused on my pain, and I am not doing my daily activities.  I am groaning in pain, and I cannot sleep. I am unable to do anything.  My pain is as bad as it could be, and nothing else matters.  Non-Medication Therapies  +  Around-the-Clock Non-Opioid Medications  +  Short-acting opioids  Opioids should be used with other medications to manage severe pain. Opioids block pain and give a feeling of euphoria (feel high). Addiction, a serious side effect of opioids, is rare with short-term (a few days) use.  Examples of short-acting opioids   include: Tramadol (Ultram), Hydrocodone (Norco, Vicodin), Hydromorphone (Dilaudid), Oxycodone (Oxycontin)     The above directions have been adapted from the American College of Surgeons Surgical Patient Education Program.  Please refer to the ACS website if needed: https://[email or web address].org/-/media/files/education/[email or web address].[email or web address]   Jeremaih Klima, MD [facility] [address], [address], [place], [place]  [address] ?  [address] [address], [place], [place]   [address] [phone] ? [phone] ? FAX [phone] Web site: [email or web address]  

## 2022-12-06 NOTE — Transfer of Care (Signed)
Immediate Anesthesia Transfer of Care Note  Patient: [name] [name]  Procedure(s) Performed: LAPAROSCOPIC BILATERAL INGUINAL HERNIA REPAIR WITH MESH OPEN HERNIA REPAIR UMBILICAL ADULT  Patient Location: PACU  Anesthesia Type:General  Level of Consciousness: awake, alert , and oriented  Airway & Oxygen Therapy: Patient Spontanous Breathing and Patient connected to face mask oxygen  Post-op Assessment: Report given to RN and Post -op Vital signs reviewed and stable  Post vital signs: Reviewed and stable  Last Vitals:  Vitals Value Taken Time  BP 116/76 12/06/22 1358  Temp    Pulse 72 12/06/22 1359  Resp 20 12/06/22 1359  SpO2 95 % 12/06/22 1359  Vitals shown include unvalidated device data.  Last Pain:  Vitals:   12/06/22 1052  TempSrc: Oral  PainSc:          Complications: No notable events documented.

## 2022-12-06 NOTE — H&P (Signed)
Admitting Physician: Hyman Hopes Tyger Wichman  Service: General Surgery  CC: Umbilical and bilateral inguinal hernia   Subjective   HPI: [name] [name] is an 52 y.o. male who is here for hernia repair  Past Medical History:  Diagnosis Date   Allergy    Anemia    Clotting disorder (HCC)    GERD (gastroesophageal reflux disease)    Heartburn    Hepatic steatosis    asymptomatic   History of kidney stones    Hyperlipidemia    Low back pain on left side with sciatica 03/27/2013   Portal vein thrombosis    Ureterolithiasis 03/09/2012   Hx of on right 2009    Past Surgical History:  Procedure Laterality Date   ANTERIOR CRUCIATE LIGAMENT REPAIR  12/09/2005   left    LASIK     left eye   left knee meniscus surgery       Family History  Problem Relation Age of Onset   Hyperlipidemia Mother    Gallbladder disease Father    Dementia Father    Diabetes Maternal Grandmother    Stroke Maternal Grandmother    Deep vein thrombosis Maternal Aunt    Colon cancer Neg Hx     Social:  reports that he has never smoked. He has never been exposed to tobacco smoke. He has never used smokeless tobacco. He reports current alcohol use. He reports that he does not use drugs.  Allergies:  Allergies  Allergen Reactions   Bee Venom Anaphylaxis   Doxycycline Hives   Keflex [Cephalexin] Hives    Medications: Current Outpatient Medications  Medication Instructions   atorvastatin (LIPITOR) 20 mg, Oral, Daily at bedtime   cetirizine (ZYRTEC) 10 mg, Oral, Daily   ferrous sulfate 325 mg, Oral, 3 times daily with meals   rosuvastatin (CRESTOR) 10 mg, Oral, Daily   sennosides-docusate sodium (SENOKOT-S) 8.6-50 MG tablet 1-2 tablets, Oral, Nightly   trimethoprim-polymyxin b (POLYTRIM) ophthalmic solution 1 drop, Both Eyes, Every  3 hours    ROS - all of the below systems have been reviewed with the patient and positives are indicated with bold text General: chills, fever or night  sweats Eyes: blurry vision or double vision ENT: epistaxis or sore throat Allergy/Immunology: itchy/watery eyes or nasal congestion Hematologic/Lymphatic: bleeding problems, blood clots or swollen lymph nodes Endocrine: temperature intolerance or unexpected weight changes Breast: new or changing breast lumps or nipple discharge Resp: cough, shortness of breath, or wheezing CV: chest pain or dyspnea on exertion GI: as per HPI GU: dysuria, trouble voiding, or hematuria MSK: joint pain or joint stiffness Neuro: TIA or stroke symptoms Derm: pruritus and skin lesion changes Psych: anxiety and depression  Objective   PE Blood pressure 134/83, pulse 79, temperature 98.9 F (37.2 C), temperature source Oral, resp. rate 17, height 6\' 1"  (1.854 m), weight 111.1 kg, SpO2 99 %. Constitutional: NAD; conversant; no deformities Eyes: Moist conjunctiva; no lid lag; anicteric; PERRL Neck: Trachea midline; no thyromegaly Lungs: Normal respiratory effort; no tactile fremitus CV: RRR; no palpable thrills; no pitting edema GI: Abd Umbilical and bilateral inguinal hernias; no palpable hepatosplenomegaly MSK: Normal range of motion of extremities; no clubbing/cyanosis Psychiatric: Appropriate affect; alert and oriented x3 Lymphatic: No palpable cervical or axillary lymphadenopathy  No results found for this or any previous visit (from the past 24 hour(s)).  Imaging Orders  No imaging studies ordered today  CT Abd/Pel 08/30/22:  1. Findings concerning for left pyelonephritis. Correlation with urinalysis recommended. No drainable  fluid collection or abscess. 2. 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 3. Colonic diverticulosis. No bowel obstruction. Normal appendix. 4. Mild fatty liver.   Assessment and Plan   Diagnoses and all orders for this visit:  Umbilical hernia without obstruction and without gangrene  Non-recurrent bilateral inguinal hernia  without obstruction or gangrene  Clotting disorder (CMS-HCC)   Umbilical hernia measures 1.4 cm tall by 1.3 cm wide on preoperative CT scan.  I recommended laparoscopic bilateral inguinal hernia repair with mesh with open umbilical hernia repair. We discussed the procedure itself as well as its risk, benefits, and alternatives. After full discussion all questions answered the patient granted consent to proceed. We will ask for preoperative evaluation by his primary care doctor for recommendations on perioperative anticoagulation due to his history of portal venous thrombosis and Coumadin use. Once this is complete our surgery scheduler will reach out to the patient to schedule surgery.     Quentin Ore, MD  [facility] Use [email or web address] to contact on call provider

## 2022-12-06 NOTE — Anesthesia Procedure Notes (Signed)
Procedure Name: Intubation Date/Time: 12/06/2022 11:46 AM  Performed by: Orest Dikes, CRNAPre-anesthesia Checklist: Patient identified, Emergency Drugs available, Suction available and Patient being monitored Patient Re-evaluated:Patient Re-evaluated prior to induction Oxygen Delivery Method: Circle system utilized Preoxygenation: Pre-oxygenation with 100% oxygen Induction Type: IV induction Ventilation: Mask ventilation without difficulty Laryngoscope Size: Mac and 4 Grade View: Grade I Tube type: Oral Tube size: 7.5 mm Number of attempts: 1 Airway Equipment and Method: Stylet Placement Confirmation: ETT inserted through vocal cords under direct vision, positive ETCO2 and breath sounds checked- equal and bilateral Secured at: 21 cm Tube secured with: Tape Dental Injury: Teeth and Oropharynx as per pre-operative assessment

## 2022-12-06 NOTE — Anesthesia Preprocedure Evaluation (Addendum)
Anesthesia Evaluation  Patient identified by MRN, date of birth, ID band Patient awake    Reviewed: Allergy & Precautions, NPO status , Patient's Chart, lab work & pertinent test results  History of Anesthesia Complications Negative for: history of anesthetic complications  Airway Mallampati: I  TM Distance: >3 FB Neck ROM: Full    Dental  (+) Dental Advisory Given   Pulmonary neg pulmonary ROS   Pulmonary exam normal breath sounds clear to auscultation       Cardiovascular (-) hypertension(-) angina (-) Past MI and (-) Cardiac Stents (-) dysrhythmias  Rhythm:Regular Rate:Normal  HLD   Neuro/Psych neg Seizures  Neuromuscular disease (low back pain with sciatica)    GI/Hepatic ,GERD  ,,Hepatic steatosis, portal vein thrombosis   Endo/Other  Pre-diabetes  Renal/GU negative Renal ROS     Musculoskeletal   Abdominal  (+) + obese  Peds  Hematology  (+) Blood dyscrasia (iron deficiency), anemia   Anesthesia Other Findings Coumadin stopped 6 weeks ago.  Last IV iron infusion 12/01/2022.  Reproductive/Obstetrics                             Anesthesia Physical Anesthesia Plan  ASA: 2  Anesthesia Plan: General   Post-op Pain Management: Tylenol PO (pre-op)*   Induction: Intravenous  PONV Risk Score and Plan: 2 and Ondansetron, Dexamethasone and Treatment may vary due to age or medical condition  Airway Management Planned: Oral ETT  Additional Equipment:   Intra-op Plan:   Post-operative Plan: Extubation in OR  Informed Consent: I have reviewed the patients History and Physical, chart, labs and discussed the procedure including the risks, benefits and alternatives for the proposed anesthesia with the patient or authorized representative who has indicated his/her understanding and acceptance.     Dental advisory given  Plan Discussed with: CRNA and Anesthesiologist  Anesthesia  Plan Comments: (Risks of general anesthesia discussed including, but not limited to, sore throat, hoarse voice, chipped/damaged teeth, injury to vocal cords, nausea and vomiting, allergic reactions, lung infection, heart attack, stroke, and death. All questions answered. )        Anesthesia Quick Evaluation

## 2022-12-06 NOTE — Anesthesia Postprocedure Evaluation (Signed)
Anesthesia Post Note  Patient: [name] [name]  Procedure(s) Performed: LAPAROSCOPIC BILATERAL INGUINAL HERNIA REPAIR WITH MESH OPEN HERNIA REPAIR UMBILICAL ADULT     Patient location during evaluation: PACU Anesthesia Type: General Level of consciousness: awake Pain management: pain level controlled Vital Signs Assessment: post-procedure vital signs reviewed and stable Respiratory status: spontaneous breathing, nonlabored ventilation and respiratory function stable Cardiovascular status: blood pressure returned to baseline and stable Postop Assessment: no apparent nausea or vomiting Anesthetic complications: no  No notable events documented.  Last Vitals:  Vitals:   12/06/22 1500 12/06/22 1525  BP: 136/75 (!) 142/82  Pulse: 67   Resp: 12   Temp:    SpO2: 95%     Last Pain:  Vitals:   12/06/22 1521  TempSrc:   PainSc: 0-No pain                 Linton Rump

## 2022-12-06 NOTE — Op Note (Addendum)
Patient: [name] [name] (01/14/1971, [id number])  Date of Surgery: 12/06/2022   Preoperative Diagnosis: BILATERAL INCARCERATED INGUINAL HERNIAS, UMBILICAL HERNIA   Postoperative Diagnosis: BILATERAL INCARCERATED INGUINAL HERNIAS  Surgical Procedure: LAPAROSCOPIC BILATERAL INGUINAL HERNIA REPAIR WITH MESH  Operative Team Members:  Surgeon(s) and Role:    * Sasuke Yaffe, Hyman Hopes, MD - Primary   Anesthesiologist: Linton Rump, MD CRNA: Theodosia Quay, CRNA; Orest Dikes, CRNA   Anesthesia: General   Fluids:  Total I/O In: -  Out: 175 [Urine:150; Blood:25]  Complications: None  Drains:  None  Specimen: None  Disposition:  PACU - hemodynamically stable.  Plan of Care: Discharge to home after PACU  Indications for Procedure:  Umbilical hernia without obstruction and without gangrene  Non-recurrent bilateral inguinal hernia with incarcerated fat and bladder  Clotting disorder (CMS-HCC)   Umbilical hernia measures 1.4 cm tall by 1.3 cm wide on preoperative CT scan.  I recommended laparoscopic bilateral inguinal hernia repair with mesh with open umbilical hernia repair. We discussed the procedure itself as well as its risk, benefits, and alternatives. After full discussion all questions answered the patient granted consent to proceed. We will ask for preoperative evaluation by his primary care doctor for recommendations on perioperative anticoagulation due to his history of portal venous thrombosis and Coumadin use. Once this is complete our surgery scheduler will reach out to the patient to schedule surgery.    Findings:  Technique: Transabdominal preperitoneal (TAPP) Hernia Location: Bilateral direct inguinal hernias containing the urinary bladder Mesh Size &Type:  Bard 3D max extra large left and Bard 3D max extra large right meshes Mesh Fixation: Endo-Universal hernia stapler  Infection status: Patient: Private Patient Elective Case Case:  Elective Infection Present At Time Of Surgery (PATOS): None   Description of Procedure:  The patient was positioned supine, padded and secured to the bed, with both arms tucked.  The abdomen was widely prepped and draped.  A time out procedure was performed.  A 1 cm infraumbilical incision was made.  The abdomen was entered without trauma to the underlying viscera.  The abdomen was insufflated to 15 mm of Hg.  A 12 mm trocar was inserted at the periumbilical incision.  Additional 5 mm trocars were placed in the left and right abdomen.  There was no trauma to the underlying viscera.  There was an direct hernia on the RIGHT containing the urinary bladder.  Utilizing a transabdominal pre peritoneal technique (TAPP), a horizontal incision was made in the peritoneum, immediately below the umbilicus.  Dissection was carried out in the pre peritoneal space down to the level of the hernia sac which was reduced into the peritoneal cavity completely.  The cord contents were parietalized and preserved.  A large pre peritoneal dissection was performed to uncover the direct, indirect, femoral and obturator spaces.  Cooper's ligament was uncovered medially and the psoas muscle uncovered laterally.  The mesh, as documented above, was opened and advanced into the pre peritoneal position so that it more than adequately covered the indirect, direct, femoral and obturator spaces.  The mesh laid flat, with no inferior folds and covered the entire myopectineal orifice.  The mesh was fixated with the endo-universal hernia stapler to Cooper's ligament and the posterior aspect of the rectus muscle.  The peritoneal flap was closed with the same device.  The flap was also closed with a 2-0 vloc suture due to a rent created in the flap.  There were no peritoneal defects or exposed mesh at  the conclusion.  There was an direct hernia on the LEFT containing the urinary bladder.  Utilizing a transabdominal pre peritoneal technique  (TAPP), a horizontal incision was made in the peritoneum, immediately below the umbilicus.  Dissection was carried out in the pre peritoneal space down to the level of the hernia sac which was reduced into the peritoneal cavity completely.  The cord contents were parietalized and preserved.  A large pre peritoneal dissection was performed to uncover the direct, indirect, femoral and obturator spaces.  Cooper's ligament was uncovered medially and the psoas muscle uncovered laterally.  The mesh, as documented above, was opened and advanced into the pre peritoneal position so that it more than adequately covered the indirect, direct, femoral and obturator spaces.  The mesh laid flat, with no inferior folds and covered the entire myopectineal orifice.  The mesh was fixated with the endo-universal hernia stapler to Cooper's ligament and the posterior aspect of the rectus muscle.  The peritoneal flap was closed with the same device.  There were no peritoneal defects or exposed mesh at the conclusion.  The umbilical trocar was removed and the fascial defect was closed with a 0 Vicryl suture.  There was no appreciable umbilical hernia defect by palpation or laparoscopically. The peritoneal cavity was completely desufflated, the trocars removed and the skin closed with 4-0 Monocryl subcuticular suture and skin glue.  All sponge and needle counts were correct at the end of the case.  Ivar Drape, MD General, Bariatric, & Minimally Invasive Surgery [facility], [place]

## 2022-12-07 ENCOUNTER — Other Ambulatory Visit: Payer: Self-pay

## 2022-12-07 ENCOUNTER — Encounter (HOSPITAL_COMMUNITY): Payer: Self-pay | Admitting: Surgery

## 2022-12-26 ENCOUNTER — Other Ambulatory Visit: Payer: Self-pay | Admitting: Family

## 2022-12-26 DIAGNOSIS — D6859 Other primary thrombophilia: Secondary | ICD-10-CM

## 2022-12-27 ENCOUNTER — Inpatient Hospital Stay: Payer: BC Managed Care – PPO | Admitting: Family

## 2022-12-27 ENCOUNTER — Inpatient Hospital Stay: Payer: BC Managed Care – PPO | Attending: Hematology & Oncology

## 2023-01-19 ENCOUNTER — Ambulatory Visit: Payer: BC Managed Care – PPO | Admitting: Urology
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
## 2023-05-31 ENCOUNTER — Encounter: Payer: BC Managed Care – PPO | Admitting: Family Medicine

## 2023-07-20 ENCOUNTER — Encounter: Payer: Self-pay | Admitting: Family Medicine

## 2023-07-20 ENCOUNTER — Ambulatory Visit: Payer: BC Managed Care – PPO | Admitting: Family Medicine

## 2023-07-20 VITALS — BP 122/84 | HR 84 | Temp 97.8°F | Ht 72.0 in | Wt 266.8 lb

## 2023-07-20 DIAGNOSIS — Z23 Encounter for immunization: Secondary | ICD-10-CM | POA: Diagnosis not present

## 2023-07-20 DIAGNOSIS — E669 Obesity, unspecified: Secondary | ICD-10-CM

## 2023-07-20 DIAGNOSIS — E782 Mixed hyperlipidemia: Secondary | ICD-10-CM

## 2023-07-20 DIAGNOSIS — S99921A Unspecified injury of right foot, initial encounter: Secondary | ICD-10-CM | POA: Insufficient documentation

## 2023-07-20 DIAGNOSIS — Z125 Encounter for screening for malignant neoplasm of prostate: Secondary | ICD-10-CM

## 2023-07-20 DIAGNOSIS — K760 Fatty (change of) liver, not elsewhere classified: Secondary | ICD-10-CM

## 2023-07-20 DIAGNOSIS — Z6836 Body mass index (BMI) 36.0-36.9, adult: Secondary | ICD-10-CM

## 2023-07-20 DIAGNOSIS — Z0001 Encounter for general adult medical examination with abnormal findings: Secondary | ICD-10-CM | POA: Diagnosis not present

## 2023-07-20 DIAGNOSIS — R7303 Prediabetes: Secondary | ICD-10-CM

## 2023-07-20 DIAGNOSIS — D6869 Other thrombophilia: Secondary | ICD-10-CM | POA: Diagnosis not present

## 2023-07-20 DIAGNOSIS — D508 Other iron deficiency anemias: Secondary | ICD-10-CM

## 2023-07-20 DIAGNOSIS — Z0000 Encounter for general adult medical examination without abnormal findings: Secondary | ICD-10-CM

## 2023-07-20 HISTORY — DX: Unspecified injury of right foot, initial encounter: S99.921A

## 2023-07-20 LAB — LIPID PANEL
Cholesterol: 305 mg/dL — ABNORMAL HIGH (ref 0–200)
HDL: 51.3 mg/dL (ref >39.00)
LDL Cholesterol: 204 mg/dL — ABNORMAL HIGH (ref 0–99)
NonHDL: 253.24
Total CHOL/HDL Ratio: 6
Triglycerides: 244 mg/dL — ABNORMAL HIGH (ref 0.0–149.0)
VLDL: 48.8 mg/dL — ABNORMAL HIGH (ref 0.0–40.0)

## 2023-07-20 LAB — COMPREHENSIVE METABOLIC PANEL
ALT: 42 U/L (ref 0–53)
AST: 24 U/L (ref 0–37)
Albumin: 4.4 g/dL (ref 3.5–5.2)
Alkaline Phosphatase: 73 U/L (ref 39–117)
BUN: 19 mg/dL (ref 6–23)
CO2: 28 meq/L (ref 19–32)
Calcium: 9.4 mg/dL (ref 8.4–10.5)
Chloride: 102 meq/L (ref 96–112)
Creatinine, Ser: 1.14 mg/dL (ref 0.40–1.50)
GFR: 74.78 mL/min (ref >60.00)
Glucose, Bld: 104 mg/dL — ABNORMAL HIGH (ref 70–99)
Potassium: 4.8 meq/L (ref 3.5–5.1)
Sodium: 137 meq/L (ref 135–145)
Total Bilirubin: 0.5 mg/dL (ref 0.2–1.2)
Total Protein: 7.2 g/dL (ref 6.0–8.3)

## 2023-07-20 LAB — IBC + FERRITIN
Ferritin: 521.8 ng/mL — ABNORMAL HIGH (ref 22.0–322.0)
Iron: 122 ug/dL (ref 42–165)
Saturation Ratios: 40.7 % (ref 20.0–50.0)
TIBC: 299.6 ug/dL (ref 250.0–450.0)
Transferrin: 214 mg/dL (ref 212.0–360.0)

## 2023-07-20 LAB — HEMOGLOBIN A1C: Hgb A1c MFr Bld: 6.2 % (ref 4.6–6.5)

## 2023-07-20 LAB — CBC
HCT: 48.6 % (ref 39.0–52.0)
Hemoglobin: 16.1 g/dL (ref 13.0–17.0)
MCHC: 33 g/dL (ref 30.0–36.0)
MCV: 101.9 fL — ABNORMAL HIGH (ref 78.0–100.0)
Platelets: 190 10*3/uL (ref 150.0–400.0)
RBC: 4.77 Mil/uL (ref 4.22–5.81)
RDW: 13.3 % (ref 11.5–15.5)
WBC: 6 10*3/uL (ref 4.0–10.5)

## 2023-07-20 LAB — TSH: TSH: 2.39 u[IU]/mL (ref 0.35–5.50)

## 2023-07-20 LAB — PSA: PSA: 0.75 ng/mL (ref 0.10–4.00)

## 2023-07-20 MED ORDER — CETIRIZINE HCL 10 MG PO TABS: 10.0000 mg | ORAL_TABLET | Freq: Every day | ORAL | 3 refills | Status: DC

## 2023-07-20 MED ORDER — ATORVASTATIN CALCIUM 20 MG PO TABS: 20.0000 mg | ORAL_TABLET | Freq: Every day | ORAL | 3 refills | Status: AC

## 2023-07-20 NOTE — Patient Instructions (Addendum)
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
## 2023-07-21 ENCOUNTER — Other Ambulatory Visit: Payer: Self-pay | Admitting: Family Medicine

## 2024-01-22 ENCOUNTER — Emergency Department (HOSPITAL_BASED_OUTPATIENT_CLINIC_OR_DEPARTMENT_OTHER): Admission: EM | Admit: 2024-01-22 | Discharge: 2024-01-22 | Disposition: A | Discharge status: Home / Self Care

## 2024-01-22 ENCOUNTER — Other Ambulatory Visit: Payer: Self-pay

## 2024-01-22 ENCOUNTER — Emergency Department (HOSPITAL_BASED_OUTPATIENT_CLINIC_OR_DEPARTMENT_OTHER)

## 2024-01-22 ENCOUNTER — Encounter (HOSPITAL_BASED_OUTPATIENT_CLINIC_OR_DEPARTMENT_OTHER): Payer: Self-pay | Admitting: *Deleted

## 2024-01-22 DIAGNOSIS — R7401 Elevation of levels of liver transaminase levels: Secondary | ICD-10-CM | POA: Diagnosis not present

## 2024-01-22 DIAGNOSIS — N202 Calculus of kidney with calculus of ureter: Secondary | ICD-10-CM | POA: Diagnosis not present

## 2024-01-22 DIAGNOSIS — N200 Calculus of kidney: Secondary | ICD-10-CM

## 2024-01-22 DIAGNOSIS — R109 Unspecified abdominal pain: Secondary | ICD-10-CM | POA: Diagnosis present

## 2024-01-22 LAB — CBC
HCT: 44.9 % (ref 39.0–52.0)
Hemoglobin: 16 g/dL (ref 13.0–17.0)
MCH: 34 pg (ref 26.0–34.0)
MCHC: 35.6 g/dL (ref 30.0–36.0)
MCV: 95.3 fL (ref 80.0–100.0)
Platelets: 193 K/uL (ref 150–400)
RBC: 4.71 MIL/uL (ref 4.22–5.81)
RDW: 12.5 % (ref 11.5–15.5)
WBC: 11.9 K/uL — ABNORMAL HIGH (ref 4.0–10.5)
nRBC: 0 % (ref 0.0–0.2)

## 2024-01-22 LAB — COMPREHENSIVE METABOLIC PANEL WITH GFR
ALT: 74 U/L — ABNORMAL HIGH (ref 0–44)
AST: 46 U/L — ABNORMAL HIGH (ref 15–41)
Albumin: 4.9 g/dL (ref 3.5–5.0)
Alkaline Phosphatase: 85 U/L (ref 38–126)
Anion gap: 21 — ABNORMAL HIGH (ref 5–15)
BUN: 17 mg/dL (ref 6–20)
CO2: 16 mmol/L — ABNORMAL LOW (ref 22–32)
Calcium: 10.4 mg/dL — ABNORMAL HIGH (ref 8.9–10.3)
Chloride: 100 mmol/L (ref 98–111)
Creatinine, Ser: 1.4 mg/dL — ABNORMAL HIGH (ref 0.61–1.24)
GFR, Estimated: >60 mL/min (ref >60)
Glucose, Bld: 116 mg/dL — ABNORMAL HIGH (ref 70–99)
Potassium: 3.9 mmol/L (ref 3.5–5.1)
Sodium: 137 mmol/L (ref 135–145)
Total Bilirubin: 0.8 mg/dL (ref 0.0–1.2)
Total Protein: 8.3 g/dL — ABNORMAL HIGH (ref 6.5–8.1)

## 2024-01-22 LAB — URINALYSIS, ROUTINE W REFLEX MICROSCOPIC
Bacteria, UA: NONE SEEN
Bilirubin Urine: NEGATIVE
Glucose, UA: NEGATIVE mg/dL
Ketones, ur: >80 mg/dL — AB
Leukocytes,Ua: NEGATIVE
Nitrite: NEGATIVE
Specific Gravity, Urine: 1.03 (ref 1.005–1.030)
pH: 7.5 (ref 5.0–8.0)

## 2024-01-22 LAB — LIPASE, BLOOD: Lipase: 43 U/L (ref 11–51)

## 2024-01-22 MED ORDER — ONDANSETRON 4 MG PO TBDP: 4.0000 mg | ORAL_TABLET | Freq: Three times a day (TID) | ORAL | 0 refills | Status: DC | PRN

## 2024-01-22 MED ORDER — ONDANSETRON HCL 4 MG/2ML IJ SOLN
4.0000 mg | Freq: Once | INTRAMUSCULAR | Status: AC | PRN
  Administered 2024-01-22: 4 mg via INTRAVENOUS
  Filled 2024-01-22: qty 2

## 2024-01-22 MED ORDER — KETOROLAC TROMETHAMINE 15 MG/ML IJ SOLN
15.0000 mg | Freq: Once | INTRAMUSCULAR | Status: AC
  Administered 2024-01-22: 15 mg via INTRAVENOUS
  Filled 2024-01-22: qty 1

## 2024-01-22 MED ORDER — OXYCODONE-ACETAMINOPHEN 5-325 MG PO TABS: 1.0000 | ORAL_TABLET | Freq: Four times a day (QID) | ORAL | 0 refills | Status: AC | PRN

## 2024-01-22 NOTE — ED Provider Notes (Signed)
 [facility] EMERGENCY DEPARTMENT AT [facility] Provider Note   CSN: [id number] Arrival date & time: 01/22/24  1559     Patient presents with: Abdominal Pain and Flank Pain   [name] [name] is a 53 y.o. male.   This is a 53 year old male presenting emergency department with left sided abdominal pain, pain rating towards left flank down to his groin.  Associate with nausea vomiting.  Has had diarrhea as well.  History of kidney stone.   Abdominal Pain Flank Pain Associated symptoms include abdominal pain.       Prior to Admission medications   Medication Sig Start Date End Date Taking? Authorizing Provider  ondansetron  (ZOFRAN -ODT) 4 MG disintegrating tablet Take 1 tablet (4 mg total) by mouth every 8 (eight) hours as needed for nausea or vomiting. 01/22/24  Yes Neysa Caron JINNY, DO  oxyCODONE -acetaminophen  (PERCOCET/ROXICET) 5-325 MG tablet Take 1 tablet by mouth every 6 (six) hours as needed for up to 3 days for severe pain (pain score 7-10). 01/22/24 01/25/24 Yes Neysa Caron JINNY, DO  atorvastatin  (LIPITOR) 20 MG tablet Take 1 tablet (20 mg total) by mouth at bedtime. 07/20/23   Kuneff, Renee A, DO  cetirizine  (ZYRTEC ) 10 MG tablet Take 1 tablet (10 mg total) by mouth daily. 07/20/23   Kuneff, Renee A, DO    Allergies: Bee venom, Dilaudid  [hydromorphone ], Doxycycline, and Keflex  [cephalexin ]    Review of Systems  Gastrointestinal:  Positive for abdominal pain.  Genitourinary:  Positive for flank pain.    Updated Vital Signs BP (!) 123/96   Pulse (!) 102   Temp 98.5 F (36.9 C)   Resp 16   SpO2 99%   Physical Exam Vitals and nursing note reviewed.  Constitutional:      General: He is not in acute distress.    Appearance: He is not toxic-appearing.     Comments: Uncomfortable appearing  HENT:     Head: Normocephalic.  Cardiovascular:     Rate and Rhythm: Normal rate and regular rhythm.  Pulmonary:     Effort: Pulmonary effort is normal.     Breath sounds:  Normal breath sounds.  Abdominal:     General: Abdomen is flat.     Tenderness: There is no abdominal tenderness.  Skin:    Capillary Refill: Capillary refill takes less than 2 seconds.  Neurological:     Mental Status: He is alert and oriented to person, place, and time.     (all labs ordered are listed, but only abnormal results are displayed) Labs Reviewed  CBC - Abnormal; Notable for the following components:      Result Value   WBC 11.9 (*)    All other components within normal limits  URINALYSIS, ROUTINE W REFLEX MICROSCOPIC - Abnormal; Notable for the following components:   Hgb urine dipstick TRACE (*)    Ketones, ur >80 (*)    Protein, ur TRACE (*)    All other components within normal limits  COMPREHENSIVE METABOLIC PANEL WITH GFR - Abnormal; Notable for the following components:   CO2 16 (*)    Glucose, Bld 116 (*)    Creatinine, Ser 1.40 (*)    Calcium  10.4 (*)    Total Protein 8.3 (*)    AST 46 (*)    ALT 74 (*)    Anion gap 21 (*)    All other components within normal limits  LIPASE, BLOOD    EKG: None  Radiology: CT Renal Stone Study Result Date: 01/22/2024  CLINICAL DATA:  Abdominal and flank pain, left-sided. EXAM: CT ABDOMEN AND PELVIS WITHOUT CONTRAST TECHNIQUE: Multidetector CT imaging of the abdomen and pelvis was performed following the standard protocol without IV contrast. RADIATION DOSE REDUCTION: This exam was performed according to the departmental dose-optimization program which includes automated exposure control, adjustment of the mA and/or kV according to patient size and/or use of iterative reconstruction technique. COMPARISON:  CT chest abdomen and pelvis 10/23/2022. FINDINGS: Lower chest: No acute abnormality. Hepatobiliary: No focal liver abnormality is seen. No gallstones, gallbladder wall thickening, or biliary dilatation. Pancreas: Unremarkable. No pancreatic ductal dilatation or surrounding inflammatory changes. Spleen: Normal in size  without focal abnormality. Adrenals/Urinary Tract: There is a 1 mm calculus at the left ureterovesicular junction. There is mild left-sided hydronephrosis and hydroureter with mild left perinephric fat stranding. There are punctate nonobstructing left renal calculi. Right kidney, adrenal glands and bladder are within normal limits. The bladder is completely decompressed. Stomach/Bowel: Stomach is within normal limits. Appendix appears normal. No evidence of bowel wall thickening, distention, or inflammatory changes. There are scattered colonic diverticula. There is a diverticulum of the proximal jejunum. Vascular/Lymphatic: Aortic atherosclerosis. No enlarged abdominal or pelvic lymph nodes. Reproductive: Prostate gland within normal limits. Other: There are moderate-sized fat containing inguinal hernias bilaterally. Low-density collection in the medial right inguinal canal hernia measures 8 x 12 mm. There is no ascites. There is a small fat containing umbilical hernia. There is no ascites. There are surgical clips along the anterior abdominal wall. Musculoskeletal: No fracture is seen. IMPRESSION: 1. 1 mm calculus at the left ureterovesicular junction with mild obstructive uropathy. 2. Nonobstructing left renal calculi. 3. Colonic diverticulosis. 4. Low-density collection in the medial right inguinal canal hernia may represent a small seroma or hematoma. Recommend clinical correlation and follow-up. 5. Moderate-sized fat containing inguinal hernias bilaterally. 6. Aortic atherosclerosis. Aortic Atherosclerosis (ICD10-I70.0). Electronically Signed   By: Greig Pique M.D.   On: 01/22/2024 16:39     Procedures   Medications Ordered in the ED  ondansetron  (ZOFRAN ) injection 4 mg (4 mg Intravenous Given 01/22/24 1619)  ketorolac  (TORADOL ) 15 MG/ML injection 15 mg (15 mg Intravenous Given 01/22/24 1701)    Clinical Course as of 01/22/24 2353  Mon Jan 22, 2024  1659 CT Renal Stone Study IMPRESSION: 1. 1 mm  calculus at the left ureterovesicular junction with mild obstructive uropathy. 2. Nonobstructing left renal calculi. 3. Colonic diverticulosis. 4. Low-density collection in the medial right inguinal canal hernia may represent a small seroma or hematoma. Recommend clinical correlation and follow-up. 5. Moderate-sized fat containing inguinal hernias bilaterally. 6. Aortic atherosclerosis.  Aortic Atherosclerosis (ICD10-I70.0).   Electronically Signed   By: Greig Pique M.D.   On: 01/22/2024 16:39   [TY]  1912 Patient feeling improved.  Discussed following up with primary doctor for repeat labs.  I suspect abnormality secondary to dehydration/starvation ketosis as he was on vacation last week drinking heavily, decreased p.o. intake today with numerous nausea vomiting diarrhea.  Discussed elevation of AST/ALT.  Not having right upper quadrant tenderness.  I suspect symptoms are secondary to his urolithiasis.  Will discharge at this time.  Return precautions given. [TY]    Clinical Course User Index [TY] Neysa Caron PARAS, DO                                 Medical Decision Making 53 year old male presenting emergency department for left-sided abdominal pain.  He is afebrile nontachycardic, slightly hypertensive.  Maintaining oxygen saturation on room air.  Benign abdominal exam.  Family members notes history of kidney stones.  Will get CT scan to evaluate for stones.  Given Toradol  and Zofran  for supportive care.  See ED course for further MDM and final disposition.  Amount and/or Complexity of Data Reviewed External Data Reviewed: radiology.    Details: CT CAP in 2024  Musculoskeletal: No fracture is seen.   IMPRESSION: 1. Acute right-sided perinephric hematoma is identified which extends into the right retroperitoneal space along the anterior aspect of the right psoas muscle into the right extraperitoneal space of the pelvis. There is mass effect upon the posterior right renal  cortex. The right kidney is displaced anteriorly. No signs of hydronephrosis or hydroureter. No extravasation of the excreted contrast material identified. 2. Two right renal veins are identified. The more distal vein appears diminutive with decrease contrast opacification. Vascular injury cannot be excluded. 3. Patchy airspace densities identified within the anterior left lower lobe. Findings are nonspecific and may reflect infection, aspiration or contusion. 4. Bilateral inguinal hernias. Left inguinal hernia contains fat only. Right inguinal hernia contains fat and dome of bladder.   Labs: ordered. Decision-making details documented in ED Course. Radiology: ordered and independent interpretation performed. Decision-making details documented in ED Course.  Risk Prescription drug management. Decision regarding hospitalization. Diagnosis or treatment significantly limited by social determinants of health. Risk Details: Lives at at home with wife.      Final diagnoses:  Kidney stone  Transaminitis    ED Discharge Orders          Ordered    oxyCODONE -acetaminophen  (PERCOCET/ROXICET) 5-325 MG tablet  Every 6 hours PRN        01/22/24 1914    ondansetron  (ZOFRAN -ODT) 4 MG disintegrating tablet  Every 8 hours PRN        01/22/24 1914               Neysa Caron PARAS, DO 01/22/24 2353

## 2024-01-22 NOTE — Discharge Instructions (Signed)
 Please follow-up with your primary doctor to recheck labs to ensure abnormalities improved.  Please take Tylenol  alternating with ibuprofen for baseline pain control.  You may use the Percocets for breakthrough pain.  Nausea medications as needed.  Try to increase your fluid intake.  Return if develop fevers, chills, chest pain, shortness of breath, uncontrolled nausea vomiting, severe pain or any new or worsening symptoms that are concerning to you.

## 2024-01-22 NOTE — ED Triage Notes (Signed)
 Patient to ED reporting sudden onset of LLQ abd pain worsening over the past 4 hours with the past hour radiating to his left flank. Patient reports a hx of kidney stones but states this does not feel like that.  Diarrhea and nausea without vomiting.   Patient appears very uncomfortable in triage.

## 2024-07-22 ENCOUNTER — Encounter: Payer: Self-pay | Admitting: Family Medicine

## 2024-07-22 ENCOUNTER — Ambulatory Visit: Payer: BC Managed Care – PPO | Admitting: Family Medicine

## 2024-07-22 VITALS — BP 120/80 | HR 78 | Temp 98.2°F | Ht 74.0 in | Wt 265.6 lb

## 2024-07-22 DIAGNOSIS — Z6834 Body mass index (BMI) 34.0-34.9, adult: Secondary | ICD-10-CM | POA: Diagnosis not present

## 2024-07-22 DIAGNOSIS — T466X5A Adverse effect of antihyperlipidemic and antiarteriosclerotic drugs, initial encounter: Secondary | ICD-10-CM

## 2024-07-22 DIAGNOSIS — Z0000 Encounter for general adult medical examination without abnormal findings: Secondary | ICD-10-CM | POA: Diagnosis not present

## 2024-07-22 DIAGNOSIS — Z23 Encounter for immunization: Secondary | ICD-10-CM

## 2024-07-22 DIAGNOSIS — E669 Obesity, unspecified: Secondary | ICD-10-CM | POA: Diagnosis not present

## 2024-07-22 DIAGNOSIS — M791 Myalgia, unspecified site: Secondary | ICD-10-CM | POA: Diagnosis not present

## 2024-07-22 DIAGNOSIS — Z125 Encounter for screening for malignant neoplasm of prostate: Secondary | ICD-10-CM

## 2024-07-22 DIAGNOSIS — E782 Mixed hyperlipidemia: Secondary | ICD-10-CM | POA: Diagnosis not present

## 2024-07-22 DIAGNOSIS — R7303 Prediabetes: Secondary | ICD-10-CM

## 2024-07-22 LAB — CBC WITH DIFFERENTIAL/PLATELET
Basophils Absolute: 0 K/uL (ref 0.0–0.1)
Basophils Relative: 0.6 % (ref 0.0–3.0)
Eosinophils Absolute: 0.1 K/uL (ref 0.0–0.7)
Eosinophils Relative: 1.4 % (ref 0.0–5.0)
HCT: 45.5 % (ref 39.0–52.0)
Hemoglobin: 15.4 g/dL (ref 13.0–17.0)
Lymphocytes Relative: 28.7 % (ref 12.0–46.0)
Lymphs Abs: 1.7 K/uL (ref 0.7–4.0)
MCHC: 33.9 g/dL (ref 30.0–36.0)
MCV: 100 fl (ref 78.0–100.0)
Monocytes Absolute: 0.7 K/uL (ref 0.1–1.0)
Monocytes Relative: 12.6 % — ABNORMAL HIGH (ref 3.0–12.0)
Neutro Abs: 3.3 K/uL (ref 1.4–7.7)
Neutrophils Relative %: 56.7 % (ref 43.0–77.0)
Platelets: 187 K/uL (ref 150.0–400.0)
RBC: 4.55 Mil/uL (ref 4.22–5.81)
RDW: 13.2 % (ref 11.5–15.5)
WBC: 5.9 K/uL (ref 4.0–10.5)

## 2024-07-22 LAB — LIPID PANEL
Cholesterol: 277 mg/dL — ABNORMAL HIGH (ref 28–200)
HDL: 55.1 mg/dL
LDL Cholesterol: 184 mg/dL — ABNORMAL HIGH (ref 10–99)
NonHDL: 221.87
Total CHOL/HDL Ratio: 5
Triglycerides: 190 mg/dL — ABNORMAL HIGH (ref 10.0–149.0)
VLDL: 38 mg/dL (ref 0.0–40.0)

## 2024-07-22 LAB — COMPREHENSIVE METABOLIC PANEL WITH GFR
ALT: 40 U/L (ref 3–53)
AST: 24 U/L (ref 5–37)
Albumin: 4.4 g/dL (ref 3.5–5.2)
Alkaline Phosphatase: 65 U/L (ref 39–117)
BUN: 16 mg/dL (ref 6–23)
CO2: 28 meq/L (ref 19–32)
Calcium: 9.2 mg/dL (ref 8.4–10.5)
Chloride: 104 meq/L (ref 96–112)
Creatinine, Ser: 1.1 mg/dL (ref 0.40–1.50)
GFR: 77.5 mL/min
Glucose, Bld: 105 mg/dL — ABNORMAL HIGH (ref 70–99)
Potassium: 4.8 meq/L (ref 3.5–5.1)
Sodium: 139 meq/L (ref 135–145)
Total Bilirubin: 0.5 mg/dL (ref 0.2–1.2)
Total Protein: 7 g/dL (ref 6.0–8.3)

## 2024-07-22 LAB — PSA: PSA: 0.53 ng/mL (ref 0.10–4.00)

## 2024-07-22 LAB — TSH: TSH: 2.15 u[IU]/mL (ref 0.35–5.50)

## 2024-07-22 LAB — HEMOGLOBIN A1C: Hgb A1c MFr Bld: 6 % (ref 4.6–6.5)

## 2024-07-22 MED ORDER — CETIRIZINE HCL 10 MG PO TABS: 10.0000 mg | ORAL_TABLET | Freq: Every day | ORAL | 3 refills | Status: AC

## 2024-07-22 NOTE — Patient Instructions (Addendum)

## 2024-07-22 NOTE — Progress Notes (Signed)
 "    Patient ID: [name] [name], male  DOB: 11/22/1970, 54 y.o.   MRN: [id number] Patient Care Team    Relationship Specialty Notifications Start End  Catherine Charlies LABOR, DO PCP - General Family Medicine  04/08/22     Chief Complaint  Patient presents with   Annual Exam     Chronic Conditions/illness Management.  Pt is fasting.    Subjective:  [name] [name] is a 54 y.o. male present for CPE and Chronic Conditions/illness Management All past medical history, surgical history, allergies, family history, immunizations, medications and social history were updated in the electronic medical record today. All recent labs, ED visits and hospitalizations within the last year were reviewed.  Health maintenance:  Colon cancer screening: No family history.  Cologuard completed 06/15/2022, normal-repeat 3 years Immunizations:  tdap UTD 05/2022, influenza-declined, Shingrix- can have by nurse visit if desires.  Pneumonia-declined Infectious disease screening: HIV declined, Hep C completed PSA:  Lab Results  Component Value Date   PSA 0.75 07/20/2023  , pt was counseled on prostate cancer screenings.  PSA collected today. Assistive device: None Oxygen use: None Patient has a Dental home. Hospitalizations/ED visits: Reviewed  Hyperlipidemia/prediabetes/obesity/fatty liver: Patient reports compliance with Lipitor 20 mg daily.      07/22/2024    8:11 AM 07/20/2023    9:23 AM 10/28/2022   11:40 AM 10/06/2022    8:59 AM 08/29/2022    8:24 AM  Depression screen PHQ 2/9  Decreased Interest 0 0 0 0 0  Down, Depressed, Hopeless 0 0 0 0 0  PHQ - 2 Score 0 0 0 0 0  Altered sleeping 0  0 0 0  Tired, decreased energy 0  1 0 1  Change in appetite 0  0 0 0  Feeling bad or failure about yourself  0  0 0 0  Trouble concentrating 0  0 0 0  Moving slowly or fidgety/restless 0  0 0 0  Suicidal thoughts 0  0 0 0  PHQ-9 Score 0  1  0  1   Difficult doing work/chores Not difficult at all  Not difficult at  all  Not difficult at all     Data saved with a previous flowsheet row definition      07/22/2024    8:11 AM 10/28/2022   11:40 AM 08/29/2022    8:24 AM 03/24/2022   11:18 AM  GAD 7 : Generalized Anxiety Score  Nervous, Anxious, on Edge 0 0 0 1  Control/stop worrying 0 0 0 0  Worry too much - different things 0 0 0 0  Trouble relaxing 1 0 0 0  Restless 0 0 0 0  Easily annoyed or irritable 1 0 0 1  Afraid - awful might happen 0 0 0 1  Total GAD 7 Score 2 0 0 3  Anxiety Difficulty Not difficult at all  Not difficult at all           07/22/2024    8:11 AM 07/20/2023    9:23 AM 10/28/2022   11:40 AM 10/06/2022    8:58 AM 08/29/2022    8:21 AM  Fall Risk   Falls in the past year? 0 0 1 1 1   Number falls in past yr: 0 0 1 0   Injury with Fall? 0 0  1  0  0   Risk for fall due to : No Fall Risks No Fall Risks History of fall(s)    Follow up  Falls evaluation completed Falls evaluation completed Falls evaluation completed Falls evaluation completed      Data saved with a previous flowsheet row definition      Immunization History  Administered Date(s) Administered   Influenza Split 04/23/2012   Influenza,inj,Quad PF,6+ Mos 03/27/2013   Tdap 05/31/2022     Past Medical History:  Diagnosis Date   Allergy    Anemia    Clotting disorder    GERD (gastroesophageal reflux disease)    Heartburn    Hepatic steatosis    asymptomatic   History of kidney stones    Hyperlipidemia    Low back pain on left side with sciatica 03/27/2013   Portal vein thrombosis    Toe injury, right, initial encounter 07/20/2023   Ureterolithiasis 03/09/2012   Hx of on right 2009   Allergies  Allergen Reactions   Bee Venom Anaphylaxis   Dilaudid  [Hydromorphone ]     Patient requesting not to receive    Doxycycline Hives   Keflex  [Cephalexin ] Hives   Past Surgical History:  Procedure Laterality Date   ANTERIOR CRUCIATE LIGAMENT REPAIR  12/09/2005   left    INGUINAL HERNIA REPAIR N/A 12/06/2022    Procedure: LAPAROSCOPIC BILATERAL INGUINAL HERNIA REPAIR WITH MESH;  Surgeon: Stechschulte, Deward PARAS, MD;  Location: [facility];  Service: General;  Laterality: N/A;   LASIK     left eye   left knee meniscus surgery      UMBILICAL HERNIA REPAIR N/A 12/06/2022   Procedure: OPEN HERNIA REPAIR UMBILICAL ADULT;  Surgeon: Lyndel Deward PARAS, MD;  Location: [facility];  Service: General;  Laterality: N/A;   Family History  Problem Relation Age of Onset   Hyperlipidemia Mother    Gallbladder disease Father    Dementia Father    Diabetes Maternal Grandmother    Stroke Maternal Grandmother    Deep vein thrombosis Maternal Aunt    Colon cancer Neg Hx    Social History   Social History Narrative   Not on file    Allergies as of 07/22/2024       Reactions   Bee Venom Anaphylaxis   Dilaudid  [hydromorphone ]    Patient requesting not to receive    Doxycycline Hives   Keflex  [cephalexin ] Hives        Medication List        Accurate as of July 22, 2024  8:37 AM. If you have any questions, ask your nurse or doctor.          STOP taking these medications    ondansetron  4 MG disintegrating tablet Commonly known as: ZOFRAN -ODT Stopped by: Zahniya Zellars, DO       TAKE these medications    atorvastatin  20 MG tablet Commonly known as: LIPITOR Take 1 tablet (20 mg total) by mouth at bedtime.   cetirizine  10 MG tablet Commonly known as: ZYRTEC  Take 1 tablet (10 mg total) by mouth daily.       All past medical history, surgical history, allergies, family history, immunizations andmedications were updated in the EMR today and reviewed under the history and medication portions of their EMR.      Review of Systems  Constitutional: Negative.   HENT: Negative.    Eyes: Negative.   Respiratory: Negative.    Cardiovascular: Negative.   Gastrointestinal: Negative.   Genitourinary: Negative.   Musculoskeletal: Negative.   Skin: Negative.   Neurological: Negative.    Endo/Heme/Allergies: Negative.   Psychiatric/Behavioral: Negative.    All other systems reviewed and  are negative.  14 pt review of systems performed and negative (unless mentioned in an HPI)  Objective: BP 120/80   Pulse 78   Temp 98.2 F (36.8 C)   Ht 6' 2 (1.88 m)   Wt 265 lb 9.6 oz (120.5 kg)   SpO2 97%   BMI 34.10 kg/m  Physical Exam Vitals and nursing note reviewed. Exam conducted with a chaperone present.  Constitutional:      General: He is not in acute distress.    Appearance: Normal appearance. He is not ill-appearing, toxic-appearing or diaphoretic.  HENT:     Head: Normocephalic and atraumatic.     Right Ear: Tympanic membrane, ear canal and external ear normal. There is no impacted cerumen.     Left Ear: Tympanic membrane, ear canal and external ear normal. There is no impacted cerumen.     Nose: Nose normal. No congestion or rhinorrhea.     Mouth/Throat:     Mouth: Mucous membranes are moist.     Pharynx: Oropharynx is clear. No oropharyngeal exudate or posterior oropharyngeal erythema.  Eyes:     General: No scleral icterus.       Right eye: No discharge.        Left eye: No discharge.     Extraocular Movements: Extraocular movements intact.     Pupils: Pupils are equal, round, and reactive to light.  Cardiovascular:     Rate and Rhythm: Normal rate and regular rhythm.     Pulses: Normal pulses.     Heart sounds: Normal heart sounds. No murmur heard.    No friction rub. No gallop.  Pulmonary:     Effort: Pulmonary effort is normal. No respiratory distress.     Breath sounds: Normal breath sounds. No stridor. No wheezing, rhonchi or rales.  Chest:     Chest wall: No tenderness.  Abdominal:     General: Abdomen is flat. Bowel sounds are normal. There is no distension.     Palpations: Abdomen is soft. There is no mass.     Tenderness: There is no abdominal tenderness. There is no right CVA tenderness, left CVA tenderness, guarding or rebound.      Hernia: No hernia is present.  Musculoskeletal:        General: No swelling or tenderness. Normal range of motion.     Cervical back: Normal range of motion and neck supple.     Right lower leg: No edema.     Left lower leg: No edema.  Lymphadenopathy:     Cervical: No cervical adenopathy.  Skin:    General: Skin is warm and dry.     Coloration: Skin is not jaundiced.     Findings: No bruising, lesion or rash.  Neurological:     General: No focal deficit present.     Mental Status: He is alert and oriented to person, place, and time. Mental status is at baseline.     Cranial Nerves: No cranial nerve deficit.     Sensory: No sensory deficit.     Motor: No weakness.     Coordination: Coordination normal.     Gait: Gait normal.     Deep Tendon Reflexes: Reflexes normal.  Psychiatric:        Mood and Affect: Mood normal.        Behavior: Behavior normal.        Thought Content: Thought content normal.        Judgment: Judgment normal.    No  results found.  Assessment/plan: [name] [name] is a 54 y.o. male present for CPE and chronic condition management Mixed hyperlipidemia/Obesity (BMI 30-39.9)/Myalgia due to statin Lipitor caused myalgias LDL has been sig. High, will discuss other statin vs inj coverage after results.  Lipid panel collected Cardiac CT score:wants to complete it next year- dwg  Prediabetes/Obesity (BMI 30-39.9) - Hemoglobin A1c - Lipid panel - CT CARDIAC SCORING (SELF PAY ONLY); Future  Influenza vaccine needed/Need for vaccination for pneumococcus declined  Prostate cancer screening - PSA  Routine general medical examination at a health care facility Colon cancer screening: No family history.  Cologuard completed 06/15/2022, normal-repeat 3 years Immunizations:  tdap UTD 05/2022, influenza-declined, Shingrix- can have by nurse visit if desires.  Pneumonia-declined Infectious disease screening: HIV declined, Hep C completed Patient was encouraged to  exercise greater than 150 minutes a week. Patient was encouraged to choose a diet filled with fresh fruits and vegetables, and lean meats. AVS provided to patient today for education/recommendation on gender specific health and safety maintenance.  Return in about 1 year (around 07/23/2025) for cpe (20 min).   Orders Placed This Encounter  Procedures   CT CARDIAC SCORING (SELF PAY ONLY)   CBC with Differential/Platelet   Hemoglobin A1c   Comprehensive metabolic panel with GFR   Lipid panel   TSH   PSA   Meds ordered this encounter  Medications   cetirizine  (ZYRTEC ) 10 MG tablet    Sig: Take 1 tablet (10 mg total) by mouth daily.    Dispense:  90 tablet    Refill:  3   Referral Orders  No referral(s) requested today     Note is dictated utilizing voice recognition software. Although note has been proof read prior to signing, occasional typographical errors still can be missed. If any questions arise, please do not hesitate to call for verification.  Electronically signed by: Charlies Bellini, DO [facility]  "

## 2024-07-23 ENCOUNTER — Ambulatory Visit: Payer: Self-pay | Admitting: Family Medicine

## 2025-07-23 ENCOUNTER — Encounter: Admitting: Family Medicine
